# Patient Record
Sex: Female | Born: 1965 | Race: White | Hispanic: No | Marital: Married | Smoking: Never smoker
Health system: Southern US, Community
[De-identification: ages and names within clinical notes are randomized; demographics above are authoritative.]

## PROBLEM LIST (undated history)

## (undated) DIAGNOSIS — G4700 Insomnia, unspecified: Secondary | ICD-10-CM

## (undated) DIAGNOSIS — M545 Low back pain, unspecified: Secondary | ICD-10-CM

## (undated) DIAGNOSIS — N920 Excessive and frequent menstruation with regular cycle: Secondary | ICD-10-CM

## (undated) DIAGNOSIS — N200 Calculus of kidney: Secondary | ICD-10-CM

## (undated) DIAGNOSIS — I3139 Other pericardial effusion (noninflammatory): Secondary | ICD-10-CM

## (undated) DIAGNOSIS — G43909 Migraine, unspecified, not intractable, without status migrainosus: Secondary | ICD-10-CM

## (undated) HISTORY — DX: Other pericardial effusion (noninflammatory): I31.39

## (undated) HISTORY — DX: Insomnia, unspecified: G47.00

## (undated) HISTORY — PX: DILATION AND CURETTAGE OF UTERUS: SHX78

## (undated) HISTORY — DX: Low back pain: M54.5

## (undated) HISTORY — DX: Calculus of kidney: N20.0

## (undated) HISTORY — DX: Low back pain, unspecified: M54.50

---

## 2008-05-31 ENCOUNTER — Encounter: Admission: RE | Admit: 2008-05-31 | Discharge: 2008-05-31 | Payer: Self-pay | Admitting: Obstetrics and Gynecology

## 2009-06-04 ENCOUNTER — Encounter: Admission: RE | Admit: 2009-06-04 | Discharge: 2009-06-04 | Payer: Self-pay | Admitting: Obstetrics and Gynecology

## 2009-10-08 ENCOUNTER — Encounter: Admission: RE | Admit: 2009-10-08 | Discharge: 2010-01-06 | Payer: Self-pay | Admitting: Gastroenterology

## 2010-06-09 ENCOUNTER — Encounter: Admission: RE | Admit: 2010-06-09 | Discharge: 2010-06-09 | Payer: Self-pay | Admitting: Obstetrics and Gynecology

## 2011-06-26 ENCOUNTER — Other Ambulatory Visit: Payer: Self-pay | Admitting: Obstetrics and Gynecology

## 2011-06-26 DIAGNOSIS — Z1231 Encounter for screening mammogram for malignant neoplasm of breast: Secondary | ICD-10-CM

## 2011-06-29 ENCOUNTER — Ambulatory Visit
Admission: RE | Admit: 2011-06-29 | Discharge: 2011-06-29 | Disposition: A | Discharge status: Home / Self Care | Payer: Managed Care, Other (non HMO) | Source: Ambulatory Visit | Attending: Obstetrics and Gynecology | Admitting: Obstetrics and Gynecology

## 2011-06-29 DIAGNOSIS — Z1231 Encounter for screening mammogram for malignant neoplasm of breast: Secondary | ICD-10-CM

## 2012-02-25 NOTE — OR Nursing (Signed)
Corrected procedure with Merrily Brittle.

## 2012-03-14 ENCOUNTER — Encounter (HOSPITAL_COMMUNITY): Payer: Self-pay | Admitting: Pharmacist

## 2012-03-14 ENCOUNTER — Encounter (HOSPITAL_COMMUNITY): Payer: Self-pay | Admitting: *Deleted

## 2012-03-23 ENCOUNTER — Ambulatory Visit: Admit: 2012-03-23 | Payer: Self-pay | Admitting: Obstetrics and Gynecology

## 2012-03-23 SURGERY — DILATATION & CURETTAGE/HYSTEROSCOPY WITH NOVASURE ABLATION
Anesthesia: Choice

## 2012-03-28 ENCOUNTER — Encounter (HOSPITAL_COMMUNITY): Payer: Self-pay | Admitting: Obstetrics and Gynecology

## 2012-03-28 DIAGNOSIS — N920 Excessive and frequent menstruation with regular cycle: Secondary | ICD-10-CM

## 2012-03-28 HISTORY — DX: Excessive and frequent menstruation with regular cycle: N92.0

## 2012-03-28 NOTE — H&P (Signed)
Jill Jenkins is an 46 y.o. female. [id number] with heavy, long, crampy menses.  Lasting 12-14 days.  Neg EMB approximately 1 year ago.  Previously with nl 5 day cycles.  Desires management.  Polyps noted on SIUS.  Will have operative hysteroscopy, D&C, polypectomy, novasure endometrial ablation.  D/w w pt r/b/a    Pertinent Gynecological History: No h/o STDs x condylomata, no abn pap OB History: G3, P2012 TSVD x 2, 15 wk AB;    Menorrhagia developed over last 1-2 years, neg EMB apx 1 yr ago.     Menstrual History: Patient's last menstrual period was 03/12/2012.    Past Medical History  Diagnosis Date  . Migraines   . Menorrhagia 03/28/2012    Past Surgical History  Procedure Date  . Dilation and curettage of uterus   T&A, Breast Augmentatiom   Social History:   reports that she has never smoked. She does not have any smokeless tobacco history on file. She reports that she does not drink alcohol or use illicit drugs. 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   HENT: Negative.   Eyes: Negative.   Respiratory: Negative.   Cardiovascular: Negative.   Gastrointestinal: Negative.   Genitourinary: Negative.   Musculoskeletal: Negative.   Skin: Negative.   Neurological: Negative.   Psychiatric/Behavioral: Negative.     Last menstrual period 03/12/2012. Physical Exam  Constitutional: She is oriented to person, place, and time. She appears well-developed and well-nourished.  HENT:  Head: Normocephalic and atraumatic.  Eyes: Conjunctivae are normal. Pupils are equal, round, and reactive to light.  Neck: Normal range of motion. Neck supple. No thyromegaly present.  Cardiovascular: Normal rate and regular rhythm.   Respiratory: Effort normal and breath sounds normal. No respiratory distress.  GI: Soft. Bowel  sounds are normal. She exhibits no distension. There is no tenderness.  Musculoskeletal: Normal range of motion.  Neurological: She is alert and oriented to person, place, and time.  Skin: Skin is warm and dry.  Psychiatric: She has a normal mood and affect. Her behavior is normal.    Assessment/Plan: Will proceed w/ operative hysteroscopy, D&C, polypectomy, and Novasure endometrial ablation  BOVARD,Demyan Fugate 03/28/2012, 10:06 PM

## 2012-03-29 ENCOUNTER — Ambulatory Visit (HOSPITAL_COMMUNITY)
Admission: RE | Admit: 2012-03-29 | Discharge: 2012-03-29 | Disposition: A | Discharge status: Home / Self Care | Payer: BC Managed Care – PPO | Source: Ambulatory Visit | Attending: Obstetrics and Gynecology | Admitting: Obstetrics and Gynecology

## 2012-03-29 ENCOUNTER — Encounter (HOSPITAL_COMMUNITY): Payer: Self-pay | Admitting: Anesthesiology

## 2012-03-29 ENCOUNTER — Ambulatory Visit (HOSPITAL_COMMUNITY): Payer: BC Managed Care – PPO | Admitting: Anesthesiology

## 2012-03-29 ENCOUNTER — Encounter (HOSPITAL_COMMUNITY)
Admission: RE | Disposition: A | Discharge status: Home / Self Care | Payer: Self-pay | Source: Ambulatory Visit | Attending: Obstetrics and Gynecology

## 2012-03-29 DIAGNOSIS — N840 Polyp of corpus uteri: Secondary | ICD-10-CM | POA: Insufficient documentation

## 2012-03-29 DIAGNOSIS — N920 Excessive and frequent menstruation with regular cycle: Secondary | ICD-10-CM | POA: Insufficient documentation

## 2012-03-29 DIAGNOSIS — N938 Other specified abnormal uterine and vaginal bleeding: Secondary | ICD-10-CM | POA: Insufficient documentation

## 2012-03-29 DIAGNOSIS — N949 Unspecified condition associated with female genital organs and menstrual cycle: Secondary | ICD-10-CM | POA: Insufficient documentation

## 2012-03-29 HISTORY — DX: Migraine, unspecified, not intractable, without status migrainosus: G43.909

## 2012-03-29 HISTORY — DX: Excessive and frequent menstruation with regular cycle: N92.0

## 2012-03-29 HISTORY — PX: POLYPECTOMY: SHX5525

## 2012-03-29 LAB — CBC
HCT: 40.9 % (ref 36.0–46.0)
MCH: 30.8 pg (ref 26.0–34.0)
MCV: 94.7 fL (ref 78.0–100.0)
Platelets: 198 10*3/uL (ref 150–400)
RDW: 12.4 % (ref 11.5–15.5)
WBC: 7.8 10*3/uL (ref 4.0–10.5)

## 2012-03-29 SURGERY — DILATATION & CURETTAGE/HYSTEROSCOPY WITH NOVASURE ABLATION
Anesthesia: General | Site: Uterus | Wound class: Clean Contaminated

## 2012-03-29 MED ORDER — IBUPROFEN 800 MG PO TABS: 800.0000 mg | ORAL_TABLET | Freq: Four times a day (QID) | ORAL | Status: AC | PRN

## 2012-03-29 MED ORDER — GLYCOPYRROLATE 0.2 MG/ML IJ SOLN
INTRAMUSCULAR | Status: AC
  Filled 2012-03-29: qty 1

## 2012-03-29 MED ORDER — DEXAMETHASONE SODIUM PHOSPHATE 4 MG/ML IJ SOLN
INTRAMUSCULAR | Status: DC | PRN
  Administered 2012-03-29: 10 mg via INTRAVENOUS

## 2012-03-29 MED ORDER — MIDAZOLAM HCL 2 MG/2ML IJ SOLN
INTRAMUSCULAR | Status: AC
  Filled 2012-03-29: qty 2

## 2012-03-29 MED ORDER — KETOROLAC TROMETHAMINE 30 MG/ML IJ SOLN
INTRAMUSCULAR | Status: DC | PRN
  Administered 2012-03-29: 30 mg via INTRAVENOUS

## 2012-03-29 MED ORDER — LIDOCAINE HCL (CARDIAC) 20 MG/ML IV SOLN
INTRAVENOUS | Status: AC
  Filled 2012-03-29: qty 5

## 2012-03-29 MED ORDER — ONDANSETRON 4 MG PO TBDP
4.0000 mg | ORAL_TABLET | Freq: Once | ORAL | Status: AC
  Administered 2012-03-29: 4 mg via ORAL

## 2012-03-29 MED ORDER — HYDROMORPHONE HCL PF 1 MG/ML IJ SOLN
INTRAMUSCULAR | Status: AC
  Administered 2012-03-29: 0.5 mg via INTRAVENOUS
  Filled 2012-03-29: qty 1

## 2012-03-29 MED ORDER — KETOROLAC TROMETHAMINE 60 MG/2ML IM SOLN
INTRAMUSCULAR | Status: AC
  Filled 2012-03-29: qty 2

## 2012-03-29 MED ORDER — LACTATED RINGERS IV SOLN
INTRAVENOUS | Status: DC
  Administered 2012-03-29 (×2): via INTRAVENOUS

## 2012-03-29 MED ORDER — ONDANSETRON 4 MG PO TBDP
ORAL_TABLET | ORAL | Status: AC
  Administered 2012-03-29: 4 mg via ORAL
  Filled 2012-03-29: qty 1

## 2012-03-29 MED ORDER — KETOROLAC TROMETHAMINE 60 MG/2ML IM SOLN
INTRAMUSCULAR | Status: DC | PRN
  Administered 2012-03-29: 30 mg via INTRAMUSCULAR

## 2012-03-29 MED ORDER — FENTANYL CITRATE 0.05 MG/ML IJ SOLN: 25.0000 ug | INTRAMUSCULAR | Status: DC | PRN

## 2012-03-29 MED ORDER — OXYCODONE-ACETAMINOPHEN 5-325 MG PO TABS: 1.0000 | ORAL_TABLET | Freq: Four times a day (QID) | ORAL | Status: AC | PRN

## 2012-03-29 MED ORDER — LIDOCAINE HCL (PF) 1 % IJ SOLN
INTRAMUSCULAR | Status: DC | PRN
  Administered 2012-03-29: 14 mL

## 2012-03-29 MED ORDER — FENTANYL CITRATE 0.05 MG/ML IJ SOLN
INTRAMUSCULAR | Status: DC | PRN
  Administered 2012-03-29 (×3): 50 ug via INTRAVENOUS

## 2012-03-29 MED ORDER — OXYCODONE-ACETAMINOPHEN 5-325 MG PO TABS
ORAL_TABLET | ORAL | Status: AC
  Administered 2012-03-29: 1 via ORAL
  Filled 2012-03-29: qty 1

## 2012-03-29 MED ORDER — FENTANYL CITRATE 0.05 MG/ML IJ SOLN
INTRAMUSCULAR | Status: AC
  Filled 2012-03-29: qty 5

## 2012-03-29 MED ORDER — ONDANSETRON HCL 4 MG/2ML IJ SOLN
INTRAMUSCULAR | Status: AC
  Filled 2012-03-29: qty 2

## 2012-03-29 MED ORDER — MIDAZOLAM HCL 5 MG/5ML IJ SOLN
INTRAMUSCULAR | Status: DC | PRN
  Administered 2012-03-29: 1 mg via INTRAVENOUS

## 2012-03-29 MED ORDER — HYDROMORPHONE HCL PF 1 MG/ML IJ SOLN
2.0000 mg | INTRAMUSCULAR | Status: DC | PRN
  Administered 2012-03-29 (×2): 0.5 mg via INTRAVENOUS

## 2012-03-29 MED ORDER — OXYCODONE-ACETAMINOPHEN 5-325 MG PO TABS
1.0000 | ORAL_TABLET | ORAL | Status: DC | PRN
  Administered 2012-03-29: 1 via ORAL

## 2012-03-29 MED ORDER — DEXAMETHASONE SODIUM PHOSPHATE 10 MG/ML IJ SOLN
INTRAMUSCULAR | Status: AC
  Filled 2012-03-29: qty 1

## 2012-03-29 MED ORDER — PROPOFOL 10 MG/ML IV EMUL
INTRAVENOUS | Status: DC | PRN
  Administered 2012-03-29: 200 mg via INTRAVENOUS

## 2012-03-29 MED ORDER — ONDANSETRON HCL 4 MG/2ML IJ SOLN
INTRAMUSCULAR | Status: DC | PRN
  Administered 2012-03-29: 4 mg via INTRAVENOUS

## 2012-03-29 MED ORDER — LIDOCAINE HCL (CARDIAC) 20 MG/ML IV SOLN
INTRAVENOUS | Status: DC | PRN
  Administered 2012-03-29: 80 mg via INTRAVENOUS

## 2012-03-29 MED ORDER — LACTATED RINGERS IR SOLN
Status: DC | PRN
  Administered 2012-03-29: 3000 mL

## 2012-03-29 MED ORDER — METOCLOPRAMIDE HCL 5 MG/ML IJ SOLN
INTRAMUSCULAR | Status: DC | PRN
  Administered 2012-03-29 (×2): 5 mg via INTRAVENOUS

## 2012-03-29 SURGICAL SUPPLY — 12 items
ABLATOR ENDOMETRIAL BIPOLAR (ABLATOR) ×3 IMPLANT
CATH ROBINSON RED A/P 16FR (CATHETERS) ×3 IMPLANT
CLOTH BEACON ORANGE TIMEOUT ST (SAFETY) ×3 IMPLANT
DRESSING TELFA 8X3 (GAUZE/BANDAGES/DRESSINGS) ×3 IMPLANT
GLOVE BIO SURGEON STRL SZ 6.5 (GLOVE) ×3 IMPLANT
GLOVE BIOGEL PI IND STRL 7.0 (GLOVE) ×6 IMPLANT
GLOVE BIOGEL PI INDICATOR 7.0 (GLOVE) ×3
GOWN PREVENTION PLUS LG XLONG (DISPOSABLE) ×6 IMPLANT
GOWN STRL REIN XL XLG (GOWN DISPOSABLE) ×9 IMPLANT
PACK HYSTEROSCOPY LF (CUSTOM PROCEDURE TRAY) ×3 IMPLANT
TOWEL OR 17X24 6PK STRL BLUE (TOWEL DISPOSABLE) ×6 IMPLANT
WATER STERILE IRR 1000ML POUR (IV SOLUTION) ×3 IMPLANT

## 2012-03-29 NOTE — Interval H&P Note (Signed)
History and Physical Interval Note:  03/29/2012 8:39 AM  Jill Jenkins  has presented today for surgery, with the diagnosis of endometrial polyp, dysfunctional bleeding  The various methods of treatment have been discussed with the patient and family. After consideration of risks, benefits and other options for treatment, the patient has consented to  Procedure(s) (LRB): DILATATION & CURETTAGE/HYSTEROSCOPY WITH NOVASURE ABLATION (N/A) CERVICAL POLYPECTOMY (N/A) as a surgical intervention .  The patient's history has been reviewed, patient examined, no change in status, stable for surgery.  I have reviewed the patients' chart and labs.  Questions were answered to the patient's satisfaction.     BOVARD,Zoejane Gaulin

## 2012-03-29 NOTE — Transfer of Care (Signed)
Immediate Anesthesia Transfer of Care Note  Patient: Jill Jenkins  Procedure(s) Performed: Procedure(s) (LRB): DILATATION & CURETTAGE/HYSTEROSCOPY WITH NOVASURE ABLATION (N/A) POLYPECTOMY (N/A)  Patient Location: PACU  Anesthesia Type: General  Level of Consciousness: awake, alert  and oriented  Airway & Oxygen Therapy: Patient Spontanous Breathing and Patient connected to nasal cannula oxygen  Post-op Assessment: Report given to PACU RN and Post -op Vital signs reviewed and stable  Post vital signs: Reviewed and stable  Complications: No apparent anesthesia complications

## 2012-03-29 NOTE — Anesthesia Procedure Notes (Signed)
Procedure Name: LMA Insertion Date/Time: 03/29/2012 9:41 AM Performed by: Graciela Husbands Pre-anesthesia Checklist: Suction available, Emergency Drugs available, Timeout performed, Patient identified and Patient being monitored Patient Re-evaluated:Patient Re-evaluated prior to inductionOxygen Delivery Method: Circle system utilized Preoxygenation: Pre-oxygenation with 100% oxygen Intubation Type: IV induction LMA: LMA inserted LMA Size: 4.0 Number of attempts: 1 Placement Confirmation: positive ETCO2 and breath sounds checked- equal and bilateral Tube secured with: Tape Dental Injury: Teeth and Oropharynx as per pre-operative assessment

## 2012-03-29 NOTE — Brief Op Note (Signed)
03/29/2012  10:25 AM  PATIENT:  Jill Jenkins  46 y.o. female  PRE-OPERATIVE DIAGNOSIS:  endometrial polyp, dysfunctional bleeding  POST-OPERATIVE DIAGNOSIS:  endometrial polyp, dysfunctional bleeding  PROCEDURE:  Procedure(s) (LRB): DILATATION & CURETTAGE/HYSTEROSCOPY WITH NOVASURE ABLATION (N/A) POLYPECTOMY (N/A)  SURGEON:  Surgeon(s) and Role:    * Sherron Monday, MD - Primary  ANESTHESIA:   general and paracervical block  EBL:  Total I/O In: 1000 [I.V.:1000] Out: - 50cc i&o cath  FINDINGS: uterus sounds to 9 cm, cervical length 3cm, cavity length 6 cm, cavity width 2.9cm; power = 96 watts, time 61 sec  BLOOD ADMINISTERED:none  DRAINS: none   LOCAL MEDICATIONS USED:  LIDOCAINE   SPECIMEN:  Source of Specimen:  endometrial currettings  DISPOSITION OF SPECIMEN:  PATHOLOGY  COUNTS:  YES  TOURNIQUET:  * No tourniquets in log *  DICTATION: .Other Dictation: Dictation Number [id number]  PLAN OF CARE: Discharge to home after PACU  PATIENT DISPOSITION:  PACU - hemodynamically stable.   Delay start of Pharmacological VTE agent (>24hrs) due to surgical blood loss or risk of bleeding: not applicable

## 2012-03-29 NOTE — Anesthesia Preprocedure Evaluation (Signed)

## 2012-03-30 ENCOUNTER — Encounter (HOSPITAL_COMMUNITY): Payer: Self-pay | Admitting: Obstetrics and Gynecology

## 2012-03-30 NOTE — Op Note (Signed)
NAME:  Jill Jenkins, Jill Jenkins                ACCOUNT NO.:  [id number]  MEDICAL RECORD NO.:  [id number]  LOCATION:  [facility]                          FACILITY:  [facility]  PHYSICIAN:  Sherron Monday, MD        DATE OF BIRTH:  06-18-1966  DATE OF PROCEDURE:  03/29/2012 DATE OF DISCHARGE:  03/29/2012                              OPERATIVE REPORT   PREOPERATIVE DIAGNOSIS:  Endometrial polyps, menorrhagia, dysfunctional uterine bleeding.  POSTOPERATIVE DIAGNOSIS: same  PROCEDURE:  Operative hysteroscopy, polypectomy, dilatation and curettage, and NovaSure endometrial ablation.  SURGEON:  Sherron Monday, MD  ANESTHESIA:  General by LMA and paracervical block.  ESTIMATED BLOOD LOSS:  Minimal.  IV FLUIDS:  1000 mL.  URINE OUTPUT:  50 mL by I and O cath prior to the procedure.  FINDINGS:  Uterus sounds to 9 cm.  Cervical length of 3 cm, cavity length of 6 cm, cavity width by NovaSure endometrial device found to be 2.9 cm.  Power 96 watts for 61 seconds.  Paracervical block with 1% lidocaine.  SPECIMEN:  Endometrial curettings to Pathology.  COMPLICATIONS:  None.  DISPOSITION:  Stable to PACU.  DESCRIPTION OF PROCEDURE:  After informed consent was reviewed with the patient including risks, benefits, and alternatives of surgical procedure, she was transported to the OR in stable condition, placed on the table in supine position.  General anesthesia was induced, found to be adequate.  She was then placed in the [place] stirrups, prepped and draped in the normal sterile fashion.  Her bladder was sterilely drained.  Using an open-sided speculum, her cervix was easily visualized, grasped with a single-tooth tenaculum, and dilated to accommodate the hysteroscope to approximately 21-French.  A brief intrauterine survey was performed; finding, bilateral ostia and noting much fluffy lining as well as likely polyps on the posterior wall of her uterus.  D and C was performed, yielding tissue.  Again,  the hysteroscope was inserted.  Survey revealed some remaining fluffy tissue on the posterior aspect of her uterus, so it was again removed by D and C.  The cervix was then measured to be 3-cm long with the Hegar dilator. Using the NovaSure device, the cavity width was calculated.  The cavity assessment was passed and the NovaSure device was then activated at 96 watts for 61 seconds.  The uterus was noted to be hemostatic.  The tenaculum was removed and its site was noted to be hemostatic.  The speculum was removed from the vagina.  The patient was returned to the supine position.  Sponge, lap, and needle counts were correct x2.  The patient tolerated the procedure well.     Sherron Monday, MD     JB/MEDQ  D:  03/29/2012  T:  03/30/2012  Job:  [id number]

## 2012-03-31 NOTE — Anesthesia Postprocedure Evaluation (Signed)
  Anesthesia Post-op Note  Patient: Jill Jenkins  Procedure(s) Performed: Procedure(s) (LRB): DILATATION & CURETTAGE/HYSTEROSCOPY WITH NOVASURE ABLATION (N/A) POLYPECTOMY (N/A)  Patient is awake and responsive. Pain and nausea are reasonably well controlled. Vital signs are stable and clinically acceptable. Oxygen saturation is clinically acceptable. There are no apparent anesthetic complications at this time. Patient is ready for discharge.

## 2012-06-28 ENCOUNTER — Other Ambulatory Visit: Payer: Self-pay | Admitting: Obstetrics and Gynecology

## 2012-06-28 DIAGNOSIS — Z1231 Encounter for screening mammogram for malignant neoplasm of breast: Secondary | ICD-10-CM

## 2012-07-28 ENCOUNTER — Other Ambulatory Visit: Payer: Self-pay | Admitting: Obstetrics and Gynecology

## 2012-07-28 ENCOUNTER — Ambulatory Visit
Admission: RE | Admit: 2012-07-28 | Discharge: 2012-07-28 | Disposition: A | Discharge status: Home / Self Care | Payer: BC Managed Care – PPO | Source: Ambulatory Visit | Attending: Obstetrics and Gynecology | Admitting: Obstetrics and Gynecology

## 2012-07-28 DIAGNOSIS — Z1231 Encounter for screening mammogram for malignant neoplasm of breast: Secondary | ICD-10-CM

## 2012-07-29 ENCOUNTER — Other Ambulatory Visit: Payer: Self-pay | Admitting: Obstetrics and Gynecology

## 2012-07-29 DIAGNOSIS — N644 Mastodynia: Secondary | ICD-10-CM

## 2012-07-29 DIAGNOSIS — N63 Unspecified lump in unspecified breast: Secondary | ICD-10-CM

## 2012-08-02 ENCOUNTER — Other Ambulatory Visit: Payer: Self-pay | Admitting: Family Medicine

## 2012-08-09 ENCOUNTER — Ambulatory Visit
Admission: RE | Admit: 2012-08-09 | Discharge: 2012-08-09 | Disposition: A | Discharge status: Home / Self Care | Payer: BC Managed Care – PPO | Source: Ambulatory Visit | Attending: Obstetrics and Gynecology | Admitting: Obstetrics and Gynecology

## 2012-08-09 DIAGNOSIS — N644 Mastodynia: Secondary | ICD-10-CM

## 2012-08-09 DIAGNOSIS — N63 Unspecified lump in unspecified breast: Secondary | ICD-10-CM

## 2013-08-01 ENCOUNTER — Other Ambulatory Visit: Payer: Self-pay

## 2013-08-01 DIAGNOSIS — Z1231 Encounter for screening mammogram for malignant neoplasm of breast: Secondary | ICD-10-CM

## 2014-04-28 ENCOUNTER — Encounter: Payer: Self-pay | Admitting: *Deleted

## 2014-11-19 ENCOUNTER — Other Ambulatory Visit: Payer: Self-pay | Admitting: Obstetrics and Gynecology

## 2014-11-19 DIAGNOSIS — R928 Other abnormal and inconclusive findings on diagnostic imaging of breast: Secondary | ICD-10-CM

## 2014-11-22 ENCOUNTER — Other Ambulatory Visit: Payer: Self-pay | Admitting: Obstetrics and Gynecology

## 2014-11-22 ENCOUNTER — Ambulatory Visit
Admission: RE | Admit: 2014-11-22 | Discharge: 2014-11-22 | Disposition: A | Discharge status: Home / Self Care | Payer: BLUE CROSS/BLUE SHIELD | Source: Ambulatory Visit | Attending: Obstetrics and Gynecology | Admitting: Obstetrics and Gynecology

## 2014-11-22 DIAGNOSIS — R928 Other abnormal and inconclusive findings on diagnostic imaging of breast: Secondary | ICD-10-CM

## 2015-04-30 ENCOUNTER — Other Ambulatory Visit: Payer: Self-pay | Admitting: Obstetrics and Gynecology

## 2015-04-30 DIAGNOSIS — R921 Mammographic calcification found on diagnostic imaging of breast: Secondary | ICD-10-CM

## 2015-05-27 ENCOUNTER — Ambulatory Visit
Admission: RE | Admit: 2015-05-27 | Discharge: 2015-05-27 | Disposition: A | Discharge status: Home / Self Care | Payer: BLUE CROSS/BLUE SHIELD | Source: Ambulatory Visit | Attending: Obstetrics and Gynecology | Admitting: Obstetrics and Gynecology

## 2015-05-27 DIAGNOSIS — R921 Mammographic calcification found on diagnostic imaging of breast: Secondary | ICD-10-CM

## 2015-09-27 ENCOUNTER — Other Ambulatory Visit: Payer: Self-pay | Admitting: Obstetrics and Gynecology

## 2015-09-27 DIAGNOSIS — R921 Mammographic calcification found on diagnostic imaging of breast: Secondary | ICD-10-CM

## 2015-11-25 ENCOUNTER — Ambulatory Visit
Admission: RE | Admit: 2015-11-25 | Discharge: 2015-11-25 | Disposition: A | Discharge status: Home / Self Care | Payer: BLUE CROSS/BLUE SHIELD | Source: Ambulatory Visit | Attending: Obstetrics and Gynecology | Admitting: Obstetrics and Gynecology

## 2015-11-25 DIAGNOSIS — R921 Mammographic calcification found on diagnostic imaging of breast: Secondary | ICD-10-CM

## 2017-05-26 ENCOUNTER — Ambulatory Visit (INDEPENDENT_AMBULATORY_CARE_PROVIDER_SITE_OTHER): Payer: BLUE CROSS/BLUE SHIELD | Admitting: Neurology

## 2017-05-26 ENCOUNTER — Encounter (INDEPENDENT_AMBULATORY_CARE_PROVIDER_SITE_OTHER): Payer: Self-pay

## 2017-05-26 ENCOUNTER — Encounter: Payer: Self-pay | Admitting: Neurology

## 2017-05-26 VITALS — BP 119/91 | HR 70 | Ht 69.0 in | Wt 199.6 lb

## 2017-05-26 DIAGNOSIS — R55 Syncope and collapse: Secondary | ICD-10-CM | POA: Diagnosis not present

## 2017-05-26 DIAGNOSIS — R569 Unspecified convulsions: Secondary | ICD-10-CM

## 2017-05-26 DIAGNOSIS — G43009 Migraine without aura, not intractable, without status migrainosus: Secondary | ICD-10-CM

## 2017-05-26 NOTE — Progress Notes (Signed)
[facility] [address] [place]. [place] [address] [phone]       OFFICE CONSULT NOTE  Jill. Jill Jenkins Date of Birth:  1966-04-13 Medical Record Number:  [id number]   Referring MD:   Nicholes Rough, PA-c Reason for Referral:  seizure  HPI: Jill Jenkins is a 51 year pleasant Caucasian lady whose had 4 episodes in the last 3 months of brief tightening of her body with clenching of hands and feet with passing out briefly.. During the first episode she was lying on the bed and had  the phone in her hand and when she noticed tightening of her extremities and she must have passed out because when she woke up she had a dog on top of her body looking at her. She had a bad headache that started behind the ears and then spread to her neck lasted several hours. She was fully aware of her surroundings. Her mind was clear she did not have any disorientation or confusion. There was no tongue bite, incontinence or injury. She has had 4 other episodes occurring roughly around every 2-3 weeks. The episodes have been not as intense and she has had no eye witness. The last episode was last week and when she was asleep she felt dizzy and had slight sensation of room spinning and felt pressure and pain behind the ears. She denies any slurred speech, double vision, gait ataxia focal extremity weakness or numbness. She has no history she has no family history of epilepsy. The patient states she is under some stress due to her marriage but denies any significant increase recently. She does take Relpax 40 mg for symptomatic relief of her migraines and it usually works quite well. She has never tried this for her headaches following these episodes.  ROS:   14 system review of systems is positive for  fatigue, ear pain and fullness, headache, dizziness, seizure, insomnia and all other  systems negative  PMH:  Past Medical History:  Diagnosis Date  . Insomnia   . Low back pain   . Menorrhagia  03/28/2012  . Migraines   . Renal calculus     Social History:  Social History   Social History  . Marital status: Married    Spouse name: N/A  . Number of children: 51  . Years of education: N/A   Occupational History  . Not on file.   Social History Main Topics  . Smoking status: Never Smoker  . Smokeless tobacco: Never Used  . Alcohol use No  . Drug use: No  . Sexual activity: Yes    Birth control/ protection: Surgical   Other Topics Concern  . Not on file   Social History Narrative  . No narrative on file    Medications:   No current outpatient prescriptions on file prior to visit.   No current facility-administered medications on file prior to visit.     Allergies:   Allergies  Allergen Reactions  . No Known Allergies     Physical Exam General: well developed, well nourished, seated, in no evident distress Head: head normocephalic and atraumatic.   Neck: supple with no carotid or supraclavicular bruits Cardiovascular: regular rate and rhythm, no murmurs Musculoskeletal: no deformity Skin:  no rash/petichiae Vascular:  Normal pulses all extremities  Neurologic Exam Mental Status: Awake and fully alert. Oriented to place and time. Recent and remote memory intact. Attention span, concentration and fund of knowledge appropriate. Mood and affect appropriate.  Cranial Nerves:  Fundoscopic exam reveals sharp disc margins. Pupils equal, briskly reactive to light. Extraocular movements full without nystagmus. Visual fields full to confrontation. Hearing intact. Facial sensation intact. Face, tongue, palate moves normally and symmetrically.  Motor: Normal bulk and tone. Normal strength in all tested extremity muscles. Sensory.: intact to touch , pinprick , position and vibratory sensation.  Coordination: Rapid alternating movements normal in all extremities. Finger-to-nose and heel-to-shin performed accurately bilaterally. Gait and Station: Arises from chair  without difficulty. Stance is normal. Gait demonstrates normal stride length and balance . Able to heel, toe and tandem walk without difficulty.  Reflexes: 1+ and symmetric. Toes downgoing.       ASSESSMENT: 51 year old Caucasian lady with recurrent stereotypical episodes of tightening of the extremities followed by brief loss of consciousness and headaches of unclear significance. Possibilities include atypical migraine versus complex partial seizures vertebrobasilar TIAs. Long-standing history of migraine headaches    PLAN: I had a long discussion with the patient with regarding her episodes of passing out and tightening making of unclear significance. Possibilities include atypical migraine versus complex partial seizures of vertebrobasilar TIA I recommend further evaluation with MRI scan of the brain with and without contrast and MRA as an EEG. Agree with increasing Topamax dose to 50 mg in the morning and 100 at night. Check I advised the patient to continue to use Relpax for symptomatic relief of her migraines and increase participation in activities for stress relaxation-like regular exercise, swimming, medication and yoga. Greater than 50% time during this 45 minute consultation visit was spent on counseling and coordination of care about her episode of passing out, typical migraine, seizure discussion and answering questions She will return for follow-up in 2 months or call earlier if necessary. Antony Contras, MD  [facility] [address] [place], [place] [address]  Phone [phone] Fax [phone] Note: This document was prepared with digital dictation and possible smart phrase technology. Any transcriptional errors that result from this process are unintentional.

## 2017-05-26 NOTE — Patient Instructions (Addendum)
I had a long discussion with the patient with regarding her episodes of passing out and tightening making of unclear significance. Possibilities include atypical migraine versus complex partial seizures of vertebrobasilar TIA I recommend further evaluation with MRI scan of the brain with and without contrast and MRA as an EEG. Agree with increasing Topamax dose to 50 mg in the morning and 100 at night. Check I advised the patient to continue to use Relpax for symptomatic relief of her migraines and increase participation in activities for stress relaxation-like regular exercise, swimming, medication and yoga. She will return for follow-up in 2 months or call earlier if necessary.

## 2017-05-27 ENCOUNTER — Telehealth: Payer: Self-pay | Admitting: Radiology

## 2017-05-27 NOTE — Telephone Encounter (Signed)
Pt returned Emily's call °

## 2017-05-27 NOTE — Telephone Encounter (Signed)
I spoke to patient and she is scheduled to have all her images done on 06/09/17 at the [facility] mobile unit.

## 2017-06-01 ENCOUNTER — Ambulatory Visit: Payer: BLUE CROSS/BLUE SHIELD | Admitting: Diagnostic Neuroimaging

## 2017-06-02 ENCOUNTER — Ambulatory Visit (INDEPENDENT_AMBULATORY_CARE_PROVIDER_SITE_OTHER): Payer: BLUE CROSS/BLUE SHIELD

## 2017-06-02 DIAGNOSIS — R55 Syncope and collapse: Secondary | ICD-10-CM

## 2017-06-02 DIAGNOSIS — R569 Unspecified convulsions: Secondary | ICD-10-CM | POA: Diagnosis not present

## 2017-06-08 ENCOUNTER — Telehealth: Payer: Self-pay

## 2017-06-08 NOTE — Telephone Encounter (Signed)
Rn call patient that EEG was normal. Pt verbalized understanding. ------

## 2017-06-08 NOTE — Telephone Encounter (Signed)
-----   Message from Garvin Fila, MD sent at 06/07/2017  3:54 PM EDT ----- Mitchell Heir inform the patient had EEG study was normal

## 2017-06-09 ENCOUNTER — Ambulatory Visit (INDEPENDENT_AMBULATORY_CARE_PROVIDER_SITE_OTHER): Payer: BLUE CROSS/BLUE SHIELD

## 2017-06-09 DIAGNOSIS — R569 Unspecified convulsions: Secondary | ICD-10-CM | POA: Diagnosis not present

## 2017-06-09 DIAGNOSIS — G43009 Migraine without aura, not intractable, without status migrainosus: Secondary | ICD-10-CM | POA: Diagnosis not present

## 2017-06-09 DIAGNOSIS — R55 Syncope and collapse: Secondary | ICD-10-CM | POA: Diagnosis not present

## 2017-06-09 MED ORDER — GADOPENTETATE DIMEGLUMINE 469.01 MG/ML IV SOLN: 20.0000 mL | Freq: Once | INTRAVENOUS | Status: DC | PRN

## 2017-06-21 ENCOUNTER — Telehealth: Payer: Self-pay | Admitting: Neurology

## 2017-06-21 NOTE — Telephone Encounter (Signed)
I spoke to the patient and gave her results of the  EEG which was normal and MRI scan which shows a few tiny nonspecific spots likely related to her migraines. MRA of the brain and neck were also both unremarkable. She had another episode of possible complicated migraine and appears to be tolerating Topamax well and hence I recommend she increase the Topamax to 100 mg twice daily. She was advised to finish off her current bottle and call for a new prescription when needed

## 2017-06-21 NOTE — Telephone Encounter (Signed)
Dr.Sethi pt would like results of MRA head,MRA neck. I can call her please advise me thanks.

## 2017-06-21 NOTE — Telephone Encounter (Signed)
Patient requesting MRI results

## 2017-08-02 ENCOUNTER — Ambulatory Visit: Payer: BLUE CROSS/BLUE SHIELD | Admitting: Neurology

## 2017-08-19 ENCOUNTER — Ambulatory Visit (INDEPENDENT_AMBULATORY_CARE_PROVIDER_SITE_OTHER): Payer: BLUE CROSS/BLUE SHIELD | Admitting: Neurology

## 2017-08-19 ENCOUNTER — Other Ambulatory Visit: Payer: Self-pay

## 2017-08-19 ENCOUNTER — Encounter (INDEPENDENT_AMBULATORY_CARE_PROVIDER_SITE_OTHER): Payer: Self-pay

## 2017-08-19 ENCOUNTER — Encounter: Payer: Self-pay | Admitting: Neurology

## 2017-08-19 VITALS — BP 116/88 | HR 80 | Wt 203.6 lb

## 2017-08-19 DIAGNOSIS — G43009 Migraine without aura, not intractable, without status migrainosus: Secondary | ICD-10-CM | POA: Diagnosis not present

## 2017-08-19 MED ORDER — TOPIRAMATE 50 MG PO TABS: 100.0000 mg | ORAL_TABLET | Freq: Two times a day (BID) | ORAL | 0 refills | Status: DC

## 2017-08-19 NOTE — Progress Notes (Signed)
[facility] [address] [place]. [place] [address] [phone]       OFFICE FOLLOW UP VISIT Ms. [place] Date of Birth:  April 29, 1966 Medical Record Number:  [id number]   Referring MD:   Nicholes Rough, PA-c Reason for Referral:  seizure  [id number] Consult 05/26/2017 ;  Ms Husband is a 24 year pleasant Caucasian lady whose had 4 episodes in the last 3 months of brief tightening of her body with clenching of hands and feet with passing out briefly.. During the first episode she was lying on the bed and had  the phone in her hand and when she noticed tightening of her extremities and she must have passed out because when she woke up she had a dog on top of her body looking at her. She had a bad headache that started behind the ears and then spread to her neck lasted several hours. She was fully aware of her surroundings. Her mind was clear she did not have any disorientation or confusion. There was no tongue bite, incontinence or injury. She has had 4 other episodes occurring roughly around every 2-3 weeks. The episodes have been not as intense and she has had no eye witness. The last episode was last week and when she was asleep she felt dizzy and had slight sensation of room spinning and felt pressure and pain behind the ears. She denies any slurred speech, double vision, gait ataxia focal extremity weakness or numbness. She has no history she has no family history of epilepsy. The patient states she is under some stress due to her marriage but denies any significant increase recently. She does take Relpax 40 mg for symptomatic relief of her migraines and it usually works quite well. She has never tried this for her headaches following these episodes. Update 08/19/2017 : She returns for follow-up after initial consultation visit 3 months ago. She states increasing the Topamax 200 g twice daily has helped. She initially had some side effects mainly his difficulty in vision but that  seems to have settled down. She has noticed improvement and she had only a single episode of her typical migraine around Thanksgiving while visiting [place]. She had MRI scan of the brain done on 06/04/17 which I have personally reviewed showed nonspecific white matter hyperintensities but no other significant abnormalities. MRI of the brain and neck both to unremarkable. She has no new neurological complaints. She denies any symptoms suggestive of diplopia, vertigo, fatigue, gait or balance problems ROS:   14 system review of systems is positive for  fatigue, ear pain and fullness, headache, dizziness, seizure, insomnia and all other  systems negative  PMH:  Past Medical History:  Diagnosis Date  . Insomnia   . Low back pain   . Menorrhagia 03/28/2012  . Migraines   . Renal calculus     Social History:  Social History   Socioeconomic History  . Marital status: Married    Spouse name: Not on file  . Number of children: Not on file  . Years of education: Not on file  . Highest education level: Not on file  Social Needs  . Financial resource strain: Not on file  . Food insecurity - worry: Not on file  . Food insecurity - inability: Not on file  . Transportation needs - medical: Not on file  . Transportation needs - non-medical: Not on file  Occupational History  . Not on file  Tobacco Use  . Smoking status: Never Smoker  .  Smokeless tobacco: Never Used  Substance and Sexual Activity  . Alcohol use: No  . Drug use: No  . Sexual activity: Yes    Birth control/protection: Surgical  Other Topics Concern  . Not on file  Social History Narrative  . Not on file    Medications:   Current Outpatient Medications on File Prior to Visit  Medication Sig Dispense Refill  . eletriptan (RELPAX) 40 MG tablet When you HAVE a severe migraine, take one; may take a second dose 2 hours later, but no more than 2 doses in 24 hours or 4 doses/week.    . estradiol (ESTRACE VAGINAL) 0.1 MG/GM  vaginal cream Estrace 0.01% (0.1 mg/gram) vaginal cream    . Eszopiclone 3 MG TABS Take by mouth.     Current Facility-Administered Medications on File Prior to Visit  Medication Dose Route Frequency Provider Last Rate Last Dose  . gadopentetate dimeglumine (MAGNEVIST) injection 20 mL  20 mL Intravenous Once PRN Garvin Fila, MD        Allergies:   Allergies  Allergen Reactions  . No Known Allergies     Physical Exam General: well developed, well nourished, seated, in no evident distress Head: head normocephalic and atraumatic.   Neck: supple with no carotid or supraclavicular bruits Cardiovascular: regular rate and rhythm, no murmurs Musculoskeletal: no deformity Skin:  no rash/petichiae Vascular:  Normal pulses all extremities  Neurologic Exam Mental Status: Awake and fully alert. Oriented to place and time. Recent and remote memory intact. Attention span, concentration and fund of knowledge appropriate. Mood and affect appropriate.  Cranial Nerves: Fundoscopic exam reveals sharp disc margins. Pupils equal, briskly reactive to light. Extraocular movements full without nystagmus. Visual fields full to confrontation. Hearing intact. Facial sensation intact. Face, tongue, palate moves normally and symmetrically.  Motor: Normal bulk and tone. Normal strength in all tested extremity muscles. Sensory.: intact to touch , pinprick , position and vibratory sensation.  Coordination: Rapid alternating movements normal in all extremities. Finger-to-nose and heel-to-shin performed accurately bilaterally. Gait and Station: Arises from chair without difficulty. Stance is normal. Gait demonstrates normal stride length and balance . Able to heel, toe and tandem walk without difficulty.  Reflexes: 1+ and symmetric. Toes downgoing.       ASSESSMENT: 51 year old Caucasian lady with recurrent stereotypical episodes of tightening of the extremities followed by brief loss of consciousness and  headaches of unclear significance. Possibilities include atypical migraine versus complex partial seizures vertebrobasilar TIAs. Long-standing history of migraine headaches. Abnormal MRI showing nonspecific white matter hyperintensities likely migraine related    PLAN: I had a long discussion with the patient with regards to her episodes of atypical migraine which appeared to have responded to Topamax. She seems to be tolerating it well in the current dose of 100 mg twice daily with acceptable side effects. She will return for follow-up in 3 months to see my nurse practitioner and if still doing well will consider tapering Topamax at that visit. I advised the patient to continue to use Relpax for symptomatic relief of her migraines and increase participation in activities for stress relaxation-like regular exercise, swimming, medication and yoga. Greater than 50% time during this 25 minute  visit was spent on counseling and coordination of care about her episode of passing out, typical migraine, seizure discussion and answering questions   Antony Contras, MD  [facility] [address] [place], [place] [address]  Phone [phone] Fax [phone] Note: This document was prepared with digital  dictation and possible smart phrase technology. Any transcriptional errors that result from this process are unintentional.

## 2017-08-19 NOTE — Patient Instructions (Signed)
I had a long discussion with the patient with regards to her episodes of atypical migraine which appeared to have responded to Topamax. She seems to be tolerating it well in the current dose of 100 mg twice daily with acceptable side effects. She will return for follow-up in 3 months to see my nurse practitioner and if still doing well will consider tapering Topamax at that visit.

## 2017-09-15 ENCOUNTER — Telehealth: Payer: Self-pay | Admitting: Neurology

## 2017-09-15 NOTE — Telephone Encounter (Signed)
Pt calling re: her topiramate (TOPAMAX) 50 MG tablet, she states from [facility] the dosage is not correct and it has her PCP's name on it.  Pt is asking for clarity on if it was filled by Dr Leonie Man and if so to advise the dosage it no correct, it states : take 1 tablet twice a day which is only 100 mg, she is supposed to 200 mg.  Pt is aware that RN Katrina is out of the office today, she is asking for a call on her return

## 2017-09-16 ENCOUNTER — Other Ambulatory Visit: Payer: Self-pay

## 2017-09-16 MED ORDER — TOPIRAMATE 50 MG PO TABS: 100.0000 mg | ORAL_TABLET | Freq: Two times a day (BID) | ORAL | 1 refills | Status: DC

## 2017-09-16 MED ORDER — TOPIRAMATE 50 MG PO TABS: 100.0000 mg | ORAL_TABLET | Freq: Two times a day (BID) | ORAL | 3 refills | Status: DC

## 2017-09-16 NOTE — Telephone Encounter (Signed)
Rn call patient about her topamax rx directions, and dose not be correct at [facility]. Rn stated Dr. Leonie Man sent the topamax to [facility] on 08/19/2017 at last visit. Rn stated the rx of 100mg  in the am, and 100mg  in the pm will be sent to [facility]. PT verbalized understanding.

## 2017-09-16 NOTE — Telephone Encounter (Signed)
Rn call patient about her topamax dosage not being at [facility]. Rn stated Dr. Leonie Man sent the rx at last visit to [facility] of 100mg  twice daily. Pt stated she takes 200mg  daily. Rn stated the rx is 50mg  with 2 tablets in the am,and 2 tablets in the pm. PT stated that's the correct dosage. Rn sent to [facility] with 90 day rx.

## 2017-11-16 NOTE — Progress Notes (Signed)
[facility]  PATIENT: Jill Jenkins DOB: Jan 20, 1966   REASON FOR VISIT: Follow-up for migraine  HISTORY FROM: patient    HISTORY OF PRESENT ILLNESS: Initial Consult 05/26/2017 ;  Jill Jenkins is a 37 year pleasant Caucasian lady whose had 4 episodes in the last 3 months of brief tightening of her body with clenching of hands and feet with passing out briefly.. During the first episode she was lying on the bed and had  the phone in her hand and when she noticed tightening of her extremities and she must have passed out because when she woke up she had a dog on top of her body looking at her. She had a bad headache that started behind the ears and then spread to her neck lasted several hours. She was fully aware of her surroundings. Her mind was clear she did not have any disorientation or confusion. There was no tongue bite, incontinence or injury. She has had 4 other episodes occurring roughly around every 2-3 weeks. The episodes have been not as intense and she has had no eye witness. The last episode was last week and when she was asleep she felt dizzy and had slight sensation of room spinning and felt pressure and pain behind the ears. She denies any slurred speech, double vision, gait ataxia focal extremity weakness or numbness. She has no history she has no family history of epilepsy. The patient states she is under some stress due to her marriage but denies any significant increase recently. She does take Relpax 40 mg for symptomatic relief of her migraines and it usually works quite well. She has never tried this for her headaches following these episodes. Update 08/19/2017 : She returns for follow-up after initial consultation visit 3 months ago. She states increasing the Topamax 200 g twice daily has helped. She initially had some side effects mainly his difficulty in vision but that seems to have settled down. She has noticed improvement and she had only a single episode of  her typical migraine around Thanksgiving while visiting [place]. She had MRI scan of the brain done on 06/04/17 which I have personally reviewed showed nonspecific white matter hyperintensities but no other significant abnormalities. MRI of the brain and neck both to unremarkable. She has no new neurological complaints. She denies any symptoms suggestive of diplopia, vertigo, fatigue, gait or balance problems Update 11/17/17: Patient is being seen today for 69-month follow-up.  She states that her last migraine was prior to Thanksgiving.  She has been tolerating Topamax 100 mg twice daily well but does complain of hair loss with this medication.  She states that she has an appointment with a specialist at [facility] that helps with her loss of the side effect medications does not have an appointment until December 2019.  Patient is also recently started doing a ketogenic diet in January and has lost 20 pounds.  She states that this has helped her feel less lethargic during the day and overall better.  Patient is willing to slowly taper Topamax but is hesitant due to fear of migraines or seizures restarting.  Patient does take Relpax 40 mg as needed for migraines but states she is only had to take 1 about every 2 months.  Patient denies any other concerns at this time.   REVIEW OF SYSTEMS: Full 14 system review of systems performed and notable only for those listed, all others are neg:  Constitutional: neg  Cardiovascular: neg Ear/Nose/Throat: neg  Skin: neg  Eyes: neg Respiratory: neg Gastroitestinal: Constipation Hematology/Lymphatic: neg  Endocrine: neg Musculoskeletal:neg Allergy/Immunology: neg Neurological: neg Psychiatric: neg Sleep : neg   ALLERGIES: Allergies  Allergen Reactions  . No Known Allergies     HOME MEDICATIONS: Outpatient Medications Prior to Visit  Medication Sig Dispense Refill  . eletriptan (RELPAX) 40 MG tablet When you HAVE a severe migraine, take one; may take a  second dose 2 hours later, but no more than 2 doses in 24 hours or 4 doses/week.    . estradiol (ESTRACE VAGINAL) 0.1 MG/GM vaginal cream Estrace 0.01% (0.1 mg/gram) vaginal cream    . Eszopiclone 3 MG TABS Take by mouth.    . topiramate (TOPAMAX) 50 MG tablet Take 2 tablets (100 mg total) by mouth 2 (two) times daily. 360 tablet 1   Facility-Administered Medications Prior to Visit  Medication Dose Route Frequency Provider Last Rate Last Dose  . gadopentetate dimeglumine (MAGNEVIST) injection 20 mL  20 mL Intravenous Once PRN Garvin Fila, MD        PAST MEDICAL HISTORY: Past Medical History:  Diagnosis Date  . Insomnia   . Low back pain   . Menorrhagia 03/28/2012  . Migraines   . Renal calculus     PAST SURGICAL HISTORY: Past Surgical History:  Procedure Laterality Date  . DILATION AND CURETTAGE OF UTERUS    . POLYPECTOMY  03/29/2012   Procedure: POLYPECTOMY;  Surgeon: Thornell Sartorius, MD;  Location: [facility];  Service: Gynecology;  Laterality: N/A;    FAMILY HISTORY: Family History  Problem Relation Age of Onset  . Hypertension Mother   . Hypertension Father   . Stroke Maternal Grandfather     SOCIAL HISTORY: Social History   Socioeconomic History  . Marital status: Married    Spouse name: Not on file  . Number of children: Not on file  . Years of education: Not on file  . Highest education level: Not on file  Social Needs  . Financial resource strain: Not on file  . Food insecurity - worry: Not on file  . Food insecurity - inability: Not on file  . Transportation needs - medical: Not on file  . Transportation needs - non-medical: Not on file  Occupational History  . Not on file  Tobacco Use  . Smoking status: Never Smoker  . Smokeless tobacco: Never Used  Substance and Sexual Activity  . Alcohol use: No  . Drug use: No  . Sexual activity: Yes    Birth control/protection: Surgical  Other Topics Concern  . Not on file  Social History Narrative  . Not on  file     PHYSICAL EXAM  There were no vitals filed for this visit. There is no height or weight on file to calculate BMI.  Generalized: Well developed, middle-aged Caucasian female, mild hair thinning, in no acute distress  Head: normocephalic and atraumatic,. Oropharynx benign  Neck: Supple, no carotid bruits  Cardiac: Regular rate rhythm, no murmur  Musculoskeletal: No deformity   Neurological examination   Mentation: Alert oriented to time, place, history taking. Attention span and concentration appropriate. Recent and remote memory intact.  Follows all commands speech and language fluent.   Cranial nerve II-XII: Fundoscopic exam reveals sharp disc margins.Pupils were equal round reactive to light extraocular movements were full, visual field were full on confrontational test. Facial sensation and strength were normal. hearing was intact to finger rubbing bilaterally. Uvula tongue midline. head turning and shoulder shrug were normal and symmetric.Tongue protrusion into  cheek strength was normal. Motor: normal bulk and tone, full strength in the BUE, BLE, fine finger movements normal, no pronator drift. No focal weakness Sensory: normal and symmetric to light touch, pinprick, and  Vibration, proprioception  Coordination: finger-nose-finger, heel-to-shin bilaterally, no dysmetria Reflexes: Brachioradialis 2/2, biceps 2/2, triceps 2/2, patellar 2/2, Achilles 2/2, plantar responses were flexor bilaterally. Gait and Station: Rising up from seated position without assistance, normal stance,  moderate stride, good arm swing, smooth turning, able to perform tiptoe, and heel walking without difficulty. Tandem gait is steady  DIAGNOSTIC DATA (LABS, IMAGING, TESTING) - I reviewed patient records, labs, notes, testing and imaging myself where available.  Lab Results  Component Value Date   WBC 7.8 03/29/2012   HGB 13.3 03/29/2012   HCT 40.9 03/29/2012   MCV 94.7 03/29/2012   PLT 198  03/29/2012    ASSESSMENT AND PLAN 52 year old Caucasian lady with recurrent stereotypical episodes of tightening of the extremities followed by brief loss of consciousness and headaches of unclear significance. Possibilities include atypical migraine versus complex partial seizures vertebrobasilar TIAs. Long-standing history of migraine headaches. Abnormal MRI showing nonspecific white matter hyperintensities likely migraine related    PLAN: Patient is willing to start to taper Topamax dose as she is having side effects of hair loss but is hesitant at the same time due to potential return of headaches or seizures.  Patient in agreement to slowly taper and patient aware that if migraines or potential seizure activity return, we can stop taper of Topamax dose or change medication.  As patient is currently taking Topamax 100 mg twice daily, patient will continue to take 100 mg in the morning but decreased evening dose to 50 mg.  Patient is aware that she can call us if headaches return but patient will call back within 1 month to potentially taper medication further.  Patient in agreement with this plan.  Patient will return for follow-up appointment in 3 months.   Venancio Poisson, AGNP-BC  [facility] [address] [place], [place] [address]  Phone [phone] Fax [phone]

## 2017-11-17 ENCOUNTER — Ambulatory Visit (INDEPENDENT_AMBULATORY_CARE_PROVIDER_SITE_OTHER): Payer: BLUE CROSS/BLUE SHIELD | Admitting: Adult Health

## 2017-11-17 ENCOUNTER — Encounter: Payer: Self-pay | Admitting: Adult Health

## 2017-11-17 VITALS — BP 113/77 | HR 76 | Wt 183.1 lb

## 2017-11-17 DIAGNOSIS — I639 Cerebral infarction, unspecified: Secondary | ICD-10-CM

## 2017-11-17 DIAGNOSIS — G43611 Persistent migraine aura with cerebral infarction, intractable, with status migrainosus: Secondary | ICD-10-CM

## 2017-11-17 MED ORDER — TOPIRAMATE 50 MG PO TABS: ORAL_TABLET | ORAL | 1 refills | Status: DC

## 2017-11-17 NOTE — Patient Instructions (Signed)
Your Plan:  Decrease topamax: 100mg  in the morning and 50mg  in the evening  Call us in 1 month to let us know how your migraines are doing - consider another taper at that time  Follow up in 3 months or call earlier if needed    Thank you for coming to see [place] at [facility]. I hope we have been able to provide you high quality care today.  You may receive a patient satisfaction survey over the next few weeks. We would appreciate your feedback and comments so that we may continue to improve ourselves and the health of our patients.

## 2017-11-19 NOTE — Progress Notes (Signed)
I have reviewed and agreed above plan. 

## 2018-02-17 ENCOUNTER — Ambulatory Visit (INDEPENDENT_AMBULATORY_CARE_PROVIDER_SITE_OTHER): Payer: BLUE CROSS/BLUE SHIELD | Admitting: Adult Health

## 2018-02-17 ENCOUNTER — Encounter: Payer: Self-pay | Admitting: Adult Health

## 2018-02-17 VITALS — BP 116/77 | HR 66 | Ht 69.0 in | Wt 163.0 lb

## 2018-02-17 DIAGNOSIS — I639 Cerebral infarction, unspecified: Secondary | ICD-10-CM | POA: Diagnosis not present

## 2018-02-17 DIAGNOSIS — G43611 Persistent migraine aura with cerebral infarction, intractable, with status migrainosus: Secondary | ICD-10-CM | POA: Diagnosis not present

## 2018-02-17 DIAGNOSIS — R569 Unspecified convulsions: Secondary | ICD-10-CM

## 2018-02-17 MED ORDER — ELETRIPTAN HYDROBROMIDE 40 MG PO TABS: ORAL_TABLET | ORAL | 5 refills | Status: DC

## 2018-02-17 MED ORDER — TOPIRAMATE 50 MG PO TABS: 50.0000 mg | ORAL_TABLET | Freq: Two times a day (BID) | ORAL | 1 refills | Status: DC

## 2018-02-17 MED ORDER — TOPIRAMATE 50 MG PO TABS
50.0000 mg | ORAL_TABLET | Freq: Two times a day (BID) | ORAL | 2 refills | Status: DC
Start: 2018-02-17 — End: 2018-11-07

## 2018-02-17 NOTE — Patient Instructions (Addendum)
Your Plan:  Decrease topamax to 50mg  twice a day  Let us know if this causes increase in headaches/seizures and we can increase dose back at that time  Continue with weight loss - healthy eating and exercise - great job on current weight loss!    Follow up in 6 months or call earlier if needed       Thank you for coming to see [place] at [facility]. I hope we have been able to provide you high quality care today.  You may receive a patient satisfaction survey over the next few weeks. We would appreciate your feedback and comments so that we may continue to improve ourselves and the health of our patients.

## 2018-02-17 NOTE — Progress Notes (Signed)
[facility]  PATIENT: Jill Jenkins DOB: 06/12/1966   REASON FOR VISIT: Follow-up for migraine  HISTORY FROM: patient    HISTORY OF PRESENT ILLNESS: Initial Consult 05/26/2017 ;  Ms Desmith is a 52 year pleasant Caucasian lady whose had 4 episodes in the last 3 months of brief tightening of her body with clenching of hands and feet with passing out briefly.. During the first episode she was lying on the bed and had  the phone in her hand and when she noticed tightening of her extremities and she must have passed out because when she woke up she had a dog on top of her body looking at her. She had a bad headache that started behind the ears and then spread to her neck lasted several hours. She was fully aware of her surroundings. Her mind was clear she did not have any disorientation or confusion. There was no tongue bite, incontinence or injury. She has had 4 other episodes occurring roughly around every 2-3 weeks. The episodes have been not as intense and she has had no eye witness. The last episode was last week and when she was asleep she felt dizzy and had slight sensation of room spinning and felt pressure and pain behind the ears. She denies any slurred speech, double vision, gait ataxia focal extremity weakness or numbness. She has no history she has no family history of epilepsy. The patient states she is under some stress due to her marriage but denies any significant increase recently. She does take Relpax 40 mg for symptomatic relief of her migraines and it usually works quite well. She has never tried this for her headaches following these episodes.  08/19/2017 visit: She returns for follow-up after initial consultation visit 3 months ago. She states increasing the Topamax 200 g twice daily has helped. She initially had some side effects mainly his difficulty in vision but that seems to have settled down. She has noticed improvement and she had only a single episode of  her typical migraine around Thanksgiving while visiting [place]. She had MRI scan of the brain done on 06/04/17 which I have personally reviewed showed nonspecific white matter hyperintensities but no other significant abnormalities. MRI of the brain and neck both to unremarkable. She has no new neurological complaints. She denies any symptoms suggestive of diplopia, vertigo, fatigue, gait or balance problems  11/17/17 visit: Patient is being seen today for 52-month follow-up.  She states that her last migraine was prior to Thanksgiving.  She has been tolerating Topamax 100 mg twice daily well but does complain of hair loss with this medication.  She states that she has an appointment with a specialist at [facility] that helps with her loss of the side effect medications does not have an appointment until December 2019.  Patient is also recently started doing a ketogenic diet in January and has lost 20 pounds.  She states that this has helped her feel less lethargic during the day and overall better.  Patient is willing to slowly taper Topamax but is hesitant due to fear of migraines or seizures restarting.  Patient does take Relpax 40 mg as needed for migraines but states she is only had to take 1 about every 2 months.  Patient denies any other concerns at this time.  02/17/18 UPDATE: Patient returns today for 52-month follow-up.  She has tolerated decrease in Topamax dose well without any increase in headaches or seizure-like activity.  States she continues to have  hair loss.  Has recently lost 40 pounds due to diet and exercise.  She is requesting an additional increase at this time of Topamax.  Patient has been having complications of a 3-week long sinus infection where she will be going to see an ENT provider next week.  Denies any other concerns at this time    REVIEW OF SYSTEMS: Full 14 system review of systems performed and notable only for those listed, all others are neg:  No  complaints   ALLERGIES: Allergies  Allergen Reactions  . No Known Allergies     HOME MEDICATIONS: Outpatient Medications Prior to Visit  Medication Sig Dispense Refill  . estradiol (ESTRACE VAGINAL) 0.1 MG/GM vaginal cream Estrace 0.01% (0.1 mg/gram) vaginal cream    . eletriptan (RELPAX) 40 MG tablet When you HAVE a severe migraine, take one; may take a second dose 2 hours later, but no more than 2 doses in 24 hours or 4 doses/week.    . topiramate (TOPAMAX) 50 MG tablet Take 100mg  (2 tabs) in the morning and 50mg  (1 tab) at bedtime (Patient taking differently: Take 100mg  (1 tabs) in the morning and 50mg  (1 tab) at bedtime) 90 tablet 1  . Eszopiclone 3 MG TABS Take by mouth.    . Eszopiclone 3 MG TABS TAKE 1 TABLET BY MOUTH IMMEDIATELY BEFORE BEDTIME AS NEEDED FOR SLEEP     Facility-Administered Medications Prior to Visit  Medication Dose Route Frequency Provider Last Rate Last Dose  . gadopentetate dimeglumine (MAGNEVIST) injection 20 mL  20 mL Intravenous Once PRN Garvin Fila, MD        PAST MEDICAL HISTORY: Past Medical History:  Diagnosis Date  . Insomnia   . Low back pain   . Menorrhagia 03/28/2012  . Migraines   . Renal calculus     PAST SURGICAL HISTORY: Past Surgical History:  Procedure Laterality Date  . DILATION AND CURETTAGE OF UTERUS    . POLYPECTOMY  03/29/2012   Procedure: POLYPECTOMY;  Surgeon: Thornell Sartorius, MD;  Location: [facility];  Service: Gynecology;  Laterality: N/A;    FAMILY HISTORY: Family History  Problem Relation Age of Onset  . Hypertension Mother   . Hypertension Father   . Stroke Maternal Grandfather     SOCIAL HISTORY: Social History   Socioeconomic History  . Marital status: Married    Spouse name: Not on file  . Number of children: Not on file  . Years of education: Not on file  . Highest education level: Not on file  Occupational History  . Not on file  Social Needs  . Financial resource strain: Not on file  . Food  insecurity:    Worry: Not on file    Inability: Not on file  . Transportation needs:    Medical: Not on file    Non-medical: Not on file  Tobacco Use  . Smoking status: Never Smoker  . Smokeless tobacco: Never Used  Substance and Sexual Activity  . Alcohol use: No  . Drug use: No  . Sexual activity: Yes    Birth control/protection: Surgical  Lifestyle  . Physical activity:    Days per week: Not on file    Minutes per session: Not on file  . Stress: Not on file  Relationships  . Social connections:    Talks on phone: Not on file    Gets together: Not on file    Attends religious service: Not on file    Active member of club or  organization: Not on file    Attends meetings of clubs or organizations: Not on file    Relationship status: Not on file  . Intimate partner violence:    Fear of current or ex partner: Not on file    Emotionally abused: Not on file    Physically abused: Not on file    Forced sexual activity: Not on file  Other Topics Concern  . Not on file  Social History Narrative  . Not on file     PHYSICAL EXAM  Vitals:   02/17/18 1106  BP: 116/77  Pulse: 66  Weight: 163 lb (73.9 kg)  Height: 5\' 9"  (1.753 m)   Body mass index is 24.07 kg/m.  Generalized: Well developed, middle-aged Caucasian female, mild hair thinning, in no acute distress  Head: normocephalic and atraumatic,. Oropharynx benign  Neck: Supple, no carotid bruits  Cardiac: Regular rate rhythm, no murmur  Musculoskeletal: No deformity   Neurological examination   Mentation: Alert oriented to time, place, history taking. Attention span and concentration appropriate. Recent and remote memory intact.  Follows all commands speech and language fluent.   Cranial nerve II-XII: Fundoscopic exam reveals sharp disc margins.Pupils were equal round reactive to light extraocular movements were full, visual field were full on confrontational test. Facial sensation and strength were normal. hearing  was intact to finger rubbing bilaterally. Uvula tongue midline. head turning and shoulder shrug were normal and symmetric.Tongue protrusion into cheek strength was normal. Motor: normal bulk and tone, full strength in the BUE, BLE, fine finger movements normal, no pronator drift. No focal weakness Sensory: normal and symmetric to light touch, pinprick, and  Vibration, proprioception  Coordination: finger-nose-finger, heel-to-shin bilaterally, no dysmetria Reflexes: Brachioradialis 2/2, biceps 2/2, triceps 2/2, patellar 2/2, Achilles 2/2, plantar responses were flexor bilaterally. Gait and Station: Rising up from seated position without assistance, normal stance,  moderate stride, good arm swing, smooth turning, able to perform tiptoe, and heel walking without difficulty. Tandem gait is steady  DIAGNOSTIC DATA (LABS, IMAGING, TESTING) - I reviewed patient records, labs, notes, testing and imaging myself where available.  No recent imaging  ASSESSMENT AND PLAN 52 year old Caucasian lady with recurrent stereotypical episodes of tightening of the extremities followed by brief loss of consciousness and headaches of unclear significance. Possibilities include atypical migraine versus complex partial seizures vertebrobasilar TIAs. Long-standing history of migraine headaches. Abnormal MRI showing nonspecific white matter hyperintensities likely migraine related.  Patient returns today for 4-month follow-up after tapering Topamax dosage for possible side effects of hair loss.    PLAN: Decrease Topamax to 50 mg twice daily - advised patient to call if she would like to taper Topamax further prior to follow-up appointment Advised patient to notify us if she has increase in headaches/seizures and we can increase dose back at that time Advised to continue weight loss through healthy eating and exercise  Patient will follow-up in 6 months or call earlier if needed  Greater than 50% of this 25-minute visit  was spent on educating patient on tapering Topamax, side effects to be aware of and when to notify us.  Patient verbalized understanding.   Venancio Poisson, AGNP-BC  [facility] [address] [place], [place] [address]  Phone [phone] Fax [phone]

## 2018-02-18 NOTE — Progress Notes (Signed)
I agree with the above plan 

## 2018-07-27 ENCOUNTER — Telehealth: Payer: Self-pay | Admitting: Adult Health

## 2018-07-27 NOTE — Telephone Encounter (Signed)
Rn call patient about stating she had a seizure yesterday and did not seek the emergency room. PT described as the same symptoms she had yesterday which KDT:OIZTI tightening of her body with clenching of hands and feet with passing out briefly. Pt stated it began with tightness in her feet and it radiate through her body to her head. Than her extremities began to move.PT stated no one witness this episode because she was home alone.Rn ask if she was alert during this episode. Pt stated she was alert and knew what was going on. She pass out briefly. Rn ask how long did the episode occur. Pt stated she was not sure maybe estimation 10 minutes. Pt stated she was dizzy weeks prior to the episode yesterday. Rn stated message will be sent to [facility] NP. PT verbalized understanding.

## 2018-07-27 NOTE — Telephone Encounter (Signed)
Pt has called to inform NP Janett Billow that out of nowhere she had a seizure yesterday and she'd like a call back to discuss,pt did not go to ER, it came and went

## 2018-07-27 NOTE — Telephone Encounter (Signed)
Per your note, same type of episode that she was initially seen in this office for that was felt to be migraine related.  As she has been stable, we have attempted to start to decrease Topamax per her request.  Currently on Topamax 50 mg twice daily.  At this time, recommend to increase nighttime dose to 100 mg and continue morning dose at 50 mg.  If she has further questions or concerns, please schedule follow-up appointment if needed.

## 2018-07-28 NOTE — Telephone Encounter (Addendum)
RN call patient about Janett Billow NP recommendations. RN stated the episode is similar in description when she was first seen by Dr. Leonie Man 05/2017.RN stated it was related to migraine. Rn stated its recommend she take 50mg  topamax,am,and 100 at pm. PT verbalized understanding. RN remind pt to call back if she has any other issues. PT has an appt on 08/24/2018 with Janett Billow NP already schedule.

## 2018-08-24 ENCOUNTER — Encounter: Payer: Self-pay | Admitting: Adult Health

## 2018-08-24 ENCOUNTER — Ambulatory Visit (INDEPENDENT_AMBULATORY_CARE_PROVIDER_SITE_OTHER): Payer: BLUE CROSS/BLUE SHIELD | Admitting: Adult Health

## 2018-08-24 VITALS — BP 117/69 | HR 72 | Ht 69.0 in | Wt 164.8 lb

## 2018-08-24 DIAGNOSIS — I639 Cerebral infarction, unspecified: Secondary | ICD-10-CM

## 2018-08-24 DIAGNOSIS — G43611 Persistent migraine aura with cerebral infarction, intractable, with status migrainosus: Secondary | ICD-10-CM | POA: Diagnosis not present

## 2018-08-24 MED ORDER — ELETRIPTAN HYDROBROMIDE 40 MG PO TABS: ORAL_TABLET | ORAL | 5 refills | Status: DC

## 2018-08-24 NOTE — Patient Instructions (Addendum)
Your Plan:  Continue prior dose of topamax 50mg  twice a day  We can consider starting topirmate ER (extented release) in the future if needed   After 3-4 months if you have not experienced migraine and/or symptoms, please let us know and we can consider decreasing further  Continue relpax as needed for migraine management    Follow up in 6 months or call earlier if needed       Thank you for coming to see [place] at [facility]. I hope we have been able to provide you high quality care today.  You may receive a patient satisfaction survey over the next few weeks. We would appreciate your feedback and comments so that we may continue to improve ourselves and the health of our patients.

## 2018-08-24 NOTE — Progress Notes (Signed)
[facility]  PATIENT: Jill Jenkins DOB: 12-Nov-1965   REASON FOR VISIT: Follow-up for migraine  HISTORY FROM: patient  Chief Complaint  Patient presents with  . Follow-up    MIgraine and seziure like activiity  room in back hallway pt alone     HISTORY OF PRESENT ILLNESS: Initial Consult 05/26/2017 ;  Jill Jenkins is a 25 year pleasant Caucasian lady whose had 4 episodes in the last 3 months of brief tightening of her body with clenching of hands and feet with passing out briefly.. During the first episode she was lying on the bed and had  the phone in her hand and when she noticed tightening of her extremities and she must have passed out because when she woke up she had a dog on top of her body looking at her. She had a bad headache that started behind the ears and then spread to her neck lasted several hours. She was fully aware of her surroundings. Her mind was clear she did not have any disorientation or confusion. There was no tongue bite, incontinence or injury. She has had 4 other episodes occurring roughly around every 2-3 weeks. The episodes have been not as intense and she has had no eye witness. The last episode was last week and when she was asleep she felt dizzy and had slight sensation of room spinning and felt pressure and pain behind the ears. She denies any slurred speech, double vision, gait ataxia focal extremity weakness or numbness. She has no history she has no family history of epilepsy. The patient states she is under some stress due to her marriage but denies any significant increase recently. She does take Relpax 40 mg for symptomatic relief of her migraines and it usually works quite well. She has never tried this for her headaches following these episodes.  08/19/2017 visit: She returns for follow-up after initial consultation visit 3 months ago. She states increasing the Topamax 200 g twice daily has helped. She initially had some side effects mainly  his difficulty in vision but that seems to have settled down. She has noticed improvement and she had only a single episode of her typical migraine around Thanksgiving while visiting [place]. She had MRI scan of the brain done on 06/04/17 which I have personally reviewed showed nonspecific white matter hyperintensities but no other significant abnormalities. MRI of the brain and neck both to unremarkable. She has no new neurological complaints. She denies any symptoms suggestive of diplopia, vertigo, fatigue, gait or balance problems  11/17/17 visit: Patient is being seen today for 4-month follow-up.  She states that her last migraine was prior to Thanksgiving.  She has been tolerating Topamax 100 mg twice daily well but does complain of hair loss with this medication.  She states that she has an appointment with a specialist at [facility] that helps with her loss of the side effect medications does not have an appointment until December 2019.  Patient is also recently started doing a ketogenic diet in January and has lost 20 pounds.  She states that this has helped her feel less lethargic during the day and overall better.  Patient is willing to slowly taper Topamax but is hesitant due to fear of migraines or seizures restarting.  Patient does take Relpax 40 mg as needed for migraines but states she is only had to take 1 about every 2 months.  Patient denies any other concerns at this time.  02/17/18 UPDATE: Patient returns today  for 40-month follow-up.  She has tolerated decrease in Topamax dose well without any increase in headaches or seizure-like activity.  States she continues to have hair loss.  Has recently lost 40 pounds due to diet and exercise.  She is requesting an additional increase at this time of Topamax.  Patient has been having complications of a 3-week long sinus infection where she will be going to see an ENT provider next week.  Denies any other concerns at this time  Interval history  08/24/2018: Patient is being seen today for follow-up visit.  Patient called office on 07/27/2018 stating since she had a seizure consisting of brief tightening of her body with clenching of her hands and feet with possible "passing out" per patient which lasted approximately 10 minutes.  This episode consisted greatly of prior episodes which were likely related to her complicated migraines.  She denies any migraine present at the time of this event but does endorse a migraine occurring approximately 1 week after.  It was recommended at that time to increase nighttime Topamax dose from 50 mg to 100 mg and continue with 50 mg a.m. Dose.  She continues to Relpax 40mg  as needed for migraines which she takes approx every 3-4 months with benefit.  She denies any other migraine or complicated migraine symptoms since prior visit.  She did bring a list from her PCP to consider starting her on Trokendi or Aimovig for migraine prophylaxis due to side effects.  She has trialed amitriptyline in the past and has been on Topamax for greater than 20 years.  She does continue to have difficulties with sleeping and has attempted using Ambien, trazodone and amitriptyline as prescribed by her PCP.  She denies any depression and/or anxiety symptoms.  She feels as though her migraines are well controlled and as they occur infrequently, she is requesting decrease of Topamax dose as she had side effects such as "fogginess" and hair loss.  No further concerns at this time.     REVIEW OF SYSTEMS: Full 14 system review of systems performed and notable only for those listed, all others are neg:  Insomnia   ALLERGIES: Allergies  Allergen Reactions  . No Known Allergies     HOME MEDICATIONS: Outpatient Medications Prior to Visit  Medication Sig Dispense Refill  . eletriptan (RELPAX) 40 MG tablet When you HAVE a severe migraine, take one; may take a second dose 2 hours later, but no more than 2 doses in 24 hours or 4  doses/week. 10 tablet 5  . estradiol (ESTRACE VAGINAL) 0.1 MG/GM vaginal cream Estrace 0.01% (0.1 mg/gram) vaginal cream    . topiramate (TOPAMAX) 50 MG tablet Take 1 tablet (50 mg total) by mouth 2 (two) times daily. (Patient taking differently: Take 50 mg by mouth 2 (two) times daily. ) 180 tablet 2  . Tretinoin Microsphere (RETIN-A MICRO PUMP) 0.06 % GEL Retin-A Micro Pump 0.06 % topical gel  1 APPLICATION THIN LAYER TO FACE NIGHTLY    . estradiol (ESTRACE VAGINAL) 0.1 MG/GM vaginal cream Estrace 0.01% (0.1 mg/gram) vaginal cream    . fluconazole (DIFLUCAN) 150 MG tablet fluconazole 150 mg tablet    . UNABLE TO FIND Gummie multivitamin daily.    . valACYclovir (VALTREX) 1000 MG tablet valacyclovir 1 gram tablet     Facility-Administered Medications Prior to Visit  Medication Dose Route Frequency Provider Last Rate Last Dose  . gadopentetate dimeglumine (MAGNEVIST) injection 20 mL  20 mL Intravenous Once PRN Garvin Fila, MD  PAST MEDICAL HISTORY: Past Medical History:  Diagnosis Date  . Insomnia   . Low back pain   . Menorrhagia 03/28/2012  . Migraines   . Renal calculus     PAST SURGICAL HISTORY: Past Surgical History:  Procedure Laterality Date  . DILATION AND CURETTAGE OF UTERUS    . POLYPECTOMY  03/29/2012   Procedure: POLYPECTOMY;  Surgeon: Thornell Sartorius, MD;  Location: [facility];  Service: Gynecology;  Laterality: N/A;    FAMILY HISTORY: Family History  Problem Relation Age of Onset  . Hypertension Mother   . Hypertension Father   . Stroke Maternal Grandfather     SOCIAL HISTORY: Social History   Socioeconomic History  . Marital status: Married    Spouse name: Not on file  . Number of children: Not on file  . Years of education: Not on file  . Highest education level: Not on file  Occupational History  . Not on file  Social Needs  . Financial resource strain: Not on file  . Food insecurity:    Worry: Not on file    Inability: Not on file  .  Transportation needs:    Medical: Not on file    Non-medical: Not on file  Tobacco Use  . Smoking status: Never Smoker  . Smokeless tobacco: Never Used  Substance and Sexual Activity  . Alcohol use: No  . Drug use: No  . Sexual activity: Yes    Birth control/protection: Surgical  Lifestyle  . Physical activity:    Days per week: Not on file    Minutes per session: Not on file  . Stress: Not on file  Relationships  . Social connections:    Talks on phone: Not on file    Gets together: Not on file    Attends religious service: Not on file    Active member of club or organization: Not on file    Attends meetings of clubs or organizations: Not on file    Relationship status: Not on file  . Intimate partner violence:    Fear of current or ex partner: Not on file    Emotionally abused: Not on file    Physically abused: Not on file    Forced sexual activity: Not on file  Other Topics Concern  . Not on file  Social History Narrative  . Not on file     PHYSICAL EXAM  Vitals:   08/24/18 1105  BP: 117/69  Pulse: 72  Weight: 164 lb 12.8 oz (74.8 kg)  Height: 5\' 9"  (1.753 m)   Body mass index is 24.34 kg/m.  Generalized: Well developed, middle-aged Caucasian female, mild hair thinning, in no acute distress  Head: normocephalic and atraumatic Neck: Supple, no carotid bruits  Cardiac: Regular rate rhythm, no murmur  Musculoskeletal: No deformity   Neurological examination   Mentation: Alert oriented to time, place, history taking. Attention span and concentration appropriate. Recent and remote memory intact.  Follows all commands speech and language fluent.   Cranial nerve II-XII: Fundoscopic exam reveals sharp disc margins.Pupils were equal round reactive to light extraocular movements were full, visual field were full on confrontational test. Facial sensation and strength were normal. hearing was intact to finger rubbing bilaterally. Uvula tongue midline. head turning and  shoulder shrug were normal and symmetric.Tongue protrusion into cheek strength was normal. Motor: normal bulk and tone, full strength in the BUE, BLE, fine finger movements normal, no pronator drift. No focal weakness Sensory: normal and symmetric to light  touch, pinprick, and  Vibration, proprioception  Coordination: finger-nose-finger, heel-to-shin bilaterally, no dysmetria Reflexes: Brachioradialis 2/2, biceps 2/2, triceps 2/2, patellar 2/2, Achilles 2/2, plantar responses were flexor bilaterally. Gait and Station: Rising up from seated position without assistance, normal stance,  moderate stride, good arm swing, smooth turning, able to perform tiptoe, and heel walking without difficulty. Tandem gait is steady  DIAGNOSTIC DATA (LABS, IMAGING, TESTING) - I reviewed patient records, labs, notes, testing and imaging myself where available.  No recent imaging  ASSESSMENT AND PLAN 52 year old Caucasian lady with recurrent stereotypical episodes of tightening of the extremities followed by brief loss of consciousness and headaches of unclear significance. Possibilities include atypical migraine versus complex partial seizures vertebrobasilar TIAs. Long-standing history of migraine headaches. Abnormal MRI showing nonspecific white matter hyperintensities likely migraine related.  Patient is being seen today for follow-up visit.  She did have one episode of complicated migraine since prior visit but otherwise has been well controlled on Topamax.    PLAN: Recommend mended to restart prior dosage of Topamax 50 mg twice daily -advised patient to continue this dose for approximately 3 to 4 months and if her migraines continue to be well controlled, we can consider decreasing further.  As patient is attempting to completely wean off the Topamax, we will hold off on starting Trokendi at this time but will consider in the future if needed -Advised patient that she is not a candidate for Aimovig as she does  not have enough of migraine days and has not trialed greater than 3 migraine medications as required by insurance -patient verbalized understanding -Advised patient to call office with worsening of her migraines and/or complicated migraines.  If we are unable to completely discontinue Topamax, we can consider Trokendi as she can have potential less side effects but it was also discussed that we can trial other migraine prophylaxis medications in the future if needed   Patient will follow-up in 6 months or call earlier if needed  Greater than 50% of this 25-minute visit was spent on educating patient on tapering Topamax, potential use of other migraine prophylaxis medication, side effects to be aware of and when to notify us.  Patient verbalized understanding.   Venancio Poisson, AGNP-BC  [facility] [address] [place], [place] [address]  Phone [phone] Fax [phone]

## 2018-08-24 NOTE — Progress Notes (Signed)
I agree with the above plan 

## 2018-11-07 ENCOUNTER — Encounter: Payer: Self-pay | Admitting: Neurology

## 2018-11-07 ENCOUNTER — Ambulatory Visit (INDEPENDENT_AMBULATORY_CARE_PROVIDER_SITE_OTHER): Payer: BLUE CROSS/BLUE SHIELD | Admitting: Neurology

## 2018-11-07 VITALS — BP 116/74 | HR 77 | Ht 69.0 in | Wt 169.2 lb

## 2018-11-07 DIAGNOSIS — R251 Tremor, unspecified: Secondary | ICD-10-CM | POA: Diagnosis not present

## 2018-11-07 DIAGNOSIS — R413 Other amnesia: Secondary | ICD-10-CM | POA: Diagnosis not present

## 2018-11-07 DIAGNOSIS — H81399 Other peripheral vertigo, unspecified ear: Secondary | ICD-10-CM

## 2018-11-07 DIAGNOSIS — R253 Fasciculation: Secondary | ICD-10-CM | POA: Diagnosis not present

## 2018-11-07 DIAGNOSIS — R42 Dizziness and giddiness: Secondary | ICD-10-CM | POA: Diagnosis not present

## 2018-11-07 MED ORDER — TOPIRAMATE 50 MG PO TABS: 25.0000 mg | ORAL_TABLET | Freq: Two times a day (BID) | ORAL | 2 refills | Status: DC

## 2018-11-07 NOTE — Patient Instructions (Signed)
I had a long discussion with the patient regarding her new multifocal symptoms of intermittent dizziness, tremulousness, muscle twitching and mild memory impairment which appear to be of unclear significance and need further work-up.  I recommend we check complete metabolic panel, CBC, TSH, vitamin B12 and order MRI scan of the brain.  I recommend we reduce the dose of Topamax to 25 mg twice daily as her migraines seem very well controlled and her subjective memory difficulties may be related to Topamax.  She is worried about family history of Parkinson's but I reassured her that her clinical symptoms and exams are not suggestive of that disease.  She was advised to increase participation in activities for stress relaxation like yoga, meditation, regular exercise.  She will keep her scheduled appointment in June with Janett Billow my nurse practitioner call earlier if necessary.

## 2018-11-07 NOTE — Progress Notes (Signed)
[facility] [address] [place] [address] [phone]       OFFICE CONSULT NOTE  Jill Jenkins Date of Birth:  1966-01-19 Medical Record Number:  [id number]   Referring MD: Cliffton Asters, PA-C  Reason for Referral: Dizziness, tremors, twitching's  HPI: Jill Jenkins is referred today for evaluation for new complaints of dizziness, tremors, twitching's and memory loss for the last 3 to 4 months.  History is obtained from the patient and review of electronic medical records.  I have personally reviewed imaging films in PACS.  She is known to [place] for prior visits for a typical migraine and was last seen on 08/24/2018 by Venancio Poisson nurse practitioner.  States her migraine headaches seem to be under good control and presently she takes Topamax 50 mg twice daily and gets headaches only once every 2 3 months or so.  Her main complaint today is that for the last 4 to 6 months she has had some progressively worsening symptoms.  She feels dizzy off balance.  She does yoga 4 days a week and at times noticed that she has to hold on to avoid falls.  She has had a few minor falls but no major injuries.  She denies true vertigo or presyncopal symptoms or loss of consciousness.  She is also noticed a mild tremor of her right hand off-and-on.  It is very fine and intermittent and does not interfere with activities of daily living.  She also feels that her body is quite rigid and at times she is grinding her teeth.  She is also noticed occasional twitching's of the muscles involving her shoulder and the neck.  She also complains of mild short-term memory difficulties.  She denies any prior history of depression or anxiety but feels that she has a underlying fear that she may develop Parkinson's disease as her mother suffered from this disease.  She has lost about 50 pounds in the last 1 year on a strict keto diet and she states she is eating healthy.  She denies any significant  headaches, loss of consciousness, double vision, gait or balance difficulties.  She has had no recent lab work or brain imaging study done.  ROS:   14 system review of systems is positive for insomnia, joint pain, dizziness, tremors, memory loss, anxiety and all other systems negative  PMH:  Past Medical History:  Diagnosis Date  . Insomnia   . Low back pain   . Menorrhagia 03/28/2012  . Migraines   . Renal calculus     Social History:  Social History   Socioeconomic History  . Marital status: Married    Spouse name: Not on file  . Number of children: Not on file  . Years of education: Not on file  . Highest education level: Not on file  Occupational History  . Not on file  Social Needs  . Financial resource strain: Not on file  . Food insecurity:    Worry: Not on file    Inability: Not on file  . Transportation needs:    Medical: Not on file    Non-medical: Not on file  Tobacco Use  . Smoking status: Never Smoker  . Smokeless tobacco: Never Used  Substance and Sexual Activity  . Alcohol use: No  . Drug use: No  . Sexual activity: Yes    Birth control/protection: Surgical  Lifestyle  . Physical activity:    Days per week: Not on file  Minutes per session: Not on file  . Stress: Not on file  Relationships  . Social connections:    Talks on phone: Not on file    Gets together: Not on file    Attends religious service: Not on file    Active member of club or organization: Not on file    Attends meetings of clubs or organizations: Not on file    Relationship status: Not on file  . Intimate partner violence:    Fear of current or ex partner: Not on file    Emotionally abused: Not on file    Physically abused: Not on file    Forced sexual activity: Not on file  Other Topics Concern  . Not on file  Social History Narrative  . Not on file    Medications:   Current Outpatient Medications on File Prior to Visit  Medication Sig Dispense Refill  . eletriptan  (RELPAX) 40 MG tablet When you HAVE a severe migraine, take one; may take a second dose 2 hours later, but no more than 2 doses in 24 hours or 4 doses/week. 10 tablet 5  . estradiol (ESTRACE VAGINAL) 0.1 MG/GM vaginal cream Estrace 0.01% (0.1 mg/gram) vaginal cream    . Suvorexant (BELSOMRA) 10 MG TABS Take by mouth at bedtime.    . Tretinoin Microsphere (RETIN-A MICRO PUMP) 0.06 % GEL Retin-A Micro Pump 0.06 % topical gel  1 APPLICATION THIN LAYER TO FACE NIGHTLY     Current Facility-Administered Medications on File Prior to Visit  Medication Dose Route Frequency Provider Last Rate Last Dose  . gadopentetate dimeglumine (MAGNEVIST) injection 20 mL  20 mL Intravenous Once PRN Garvin Fila, MD        Allergies:   Allergies  Allergen Reactions  . No Known Allergies     Physical Exam General: well developed, well nourished, seated, in no evident distress Head: head normocephalic and atraumatic.   Neck: supple with no carotid or supraclavicular bruits Cardiovascular: regular rate and rhythm, no murmurs Musculoskeletal: no deformity Skin:  no rash/petichiae Vascular:  Normal pulses all extremities  Neurologic Exam Mental Status: Awake and fully alert. Oriented to place and time. Recent and remote memory intact. Attention span, concentration and fund of knowledge appropriate. Mood and affect appropriate.  She appears anxious.  Mini-Mental status exam she scored 27/30 with 1 deficits in orientation and recall.  She was able to name 8 four-legged animals.  Clock drawing score was 4/4.  On the depression scale she scored 7 suggestive of mild depression Cranial Nerves: Fundoscopic exam reveals sharp disc margins. Pupils equal, briskly reactive to light. Extraocular movements full without nystagmus. Visual fields full to confrontation. Hearing intact. Facial sensation intact. Face, tongue, palate moves normally and symmetrically.  Motor: Normal bulk and tone. Normal strength in all tested  extremity muscles. Sensory.: intact to touch , pinprick , position and vibratory sensation.  Coordination: Rapid alternating movements normal in all extremities. Finger-to-nose and heel-to-shin performed accurately bilaterally. Gait and Station: Arises from chair without difficulty. Stance is normal. Gait demonstrates normal stride length and balance . Able to heel, toe and tandem walk without difficulty.  Reflexes: 1+ and symmetric. Toes downgoing.       ASSESSMENT: 53 year old Caucasian lady with 3 to 30-month history of progressive intermittent symptoms of dizziness, imbalance, tremors, muscle twitching's and memory difficulties of unclear etiology.  She has prior history of her typical migraines and an abnormal MRI showing nonspecific white matter hyperintensities likely migraine related.  Her migraines seem well controlled on Topamax     PLAN: I had a long discussion with the patient regarding her new multifocal symptoms of intermittent dizziness, tremulousness, muscle twitching and mild memory impairment which appear to be of unclear significance and need further work-up.  I recommend we check complete metabolic panel, CBC, TSH, vitamin B12 and order MRI scan of the brain.  I recommend we reduce the dose of Topamax to 25 mg twice daily as her migraines seem very well controlled and her subjective memory difficulties may be related to Topamax.  She is worried about family history of Parkinson's but I reassured her that her clinical symptoms and exams are not suggestive of that disease.  She was advised to increase participation in activities for stress relaxation like yoga, meditation, regular exercise.  Greater than 50% time during this 45-minute consultation visit was spent on counseling and coordination of care about her multifocal symptoms and migraines and answering questions she will keep her scheduled appointment in June with Janett Billow my nurse practitioner call earlier if necessary. Antony Contras, MD  [facility] [address] [place],  [address]  Phone [phone] Fax [phone] Note: This document was prepared with digital dictation and possible smart phrase technology. Any transcriptional errors that result from this process are unintentional.

## 2018-11-09 ENCOUNTER — Telehealth: Payer: Self-pay | Admitting: Neurology

## 2018-11-09 NOTE — Telephone Encounter (Signed)
MR Brain w/wo contrast Dr. Valentina Gu Auth: Nice Ref # [id number]. Patient is scheduled at [facility] for 11/16/18

## 2018-11-10 ENCOUNTER — Telehealth: Payer: Self-pay | Admitting: *Deleted

## 2018-11-10 NOTE — Telephone Encounter (Signed)
-----   Message from Garvin Fila, MD sent at 11/09/2018  4:41 PM EST ----- And inform the patient that most of the lab work is back and appears normal

## 2018-11-10 NOTE — Telephone Encounter (Signed)
Called and spoke with pt about results per Dr. Leonie Man note. She verbalized understanding.

## 2018-11-15 LAB — TSH: TSH: 1.66 u[IU]/mL (ref 0.450–4.500)

## 2018-11-15 LAB — VITAMIN B12: VITAMIN B 12: 360 pg/mL (ref 232–1245)

## 2018-11-15 LAB — COMPREHENSIVE METABOLIC PANEL
ALBUMIN: 4.3 g/dL (ref 3.8–4.9)
ALT: 16 IU/L (ref 0–32)
AST: 13 IU/L (ref 0–40)
Albumin/Globulin Ratio: 2.5 — ABNORMAL HIGH (ref 1.2–2.2)
Alkaline Phosphatase: 51 IU/L (ref 39–117)
BUN / CREAT RATIO: 18 (ref 9–23)
BUN: 14 mg/dL (ref 6–24)
Bilirubin Total: 0.2 mg/dL (ref 0.0–1.2)
CO2: 19 mmol/L — AB (ref 20–29)
CREATININE: 0.77 mg/dL (ref 0.57–1.00)
Calcium: 9 mg/dL (ref 8.7–10.2)
Chloride: 106 mmol/L (ref 96–106)
GFR calc non Af Amer: 89 mL/min/{1.73_m2} (ref >59)
GFR, EST AFRICAN AMERICAN: 103 mL/min/{1.73_m2} (ref >59)
GLUCOSE: 81 mg/dL (ref 65–99)
Globulin, Total: 1.7 g/dL (ref 1.5–4.5)
Potassium: 4.3 mmol/L (ref 3.5–5.2)
Sodium: 140 mmol/L (ref 134–144)
TOTAL PROTEIN: 6 g/dL (ref 6.0–8.5)

## 2018-11-15 LAB — CBC
HEMATOCRIT: 40.5 % (ref 34.0–46.6)
HEMOGLOBIN: 14.2 g/dL (ref 11.1–15.9)
MCH: 33.4 pg — ABNORMAL HIGH (ref 26.6–33.0)
MCHC: 35.1 g/dL (ref 31.5–35.7)
MCV: 95 fL (ref 79–97)
Platelets: 189 10*3/uL (ref 150–450)
RBC: 4.25 x10E6/uL (ref 3.77–5.28)
RDW: 11.1 % — AB (ref 11.7–15.4)
WBC: 7.1 10*3/uL (ref 3.4–10.8)

## 2018-11-15 LAB — VITAMIN B1

## 2018-11-16 ENCOUNTER — Other Ambulatory Visit: Payer: Self-pay | Admitting: Neurology

## 2018-11-16 ENCOUNTER — Ambulatory Visit: Payer: BLUE CROSS/BLUE SHIELD

## 2018-11-16 DIAGNOSIS — H81399 Other peripheral vertigo, unspecified ear: Secondary | ICD-10-CM

## 2018-11-28 ENCOUNTER — Telehealth: Payer: Self-pay

## 2018-11-28 NOTE — Telephone Encounter (Signed)
-----   Message from Garvin Fila, MD sent at 11/27/2018 10:53 AM EDT ----- Jill Jenkins inform the patient that MRI scan of the brain was unremarkable. No significant change from previous MRI from September 2018.No worrisome finding

## 2018-11-28 NOTE — Telephone Encounter (Signed)
Notes recorded by Marval Regal, RN on 11/28/2018 at 11:22 AM EDT I called patient that the MRI of the brain was unremarkable. There was no significant change from the previous MRI from No worrisome findings. PT verbalized understanding. ------

## 2018-12-19 ENCOUNTER — Other Ambulatory Visit: Payer: Self-pay | Admitting: Adult Health

## 2018-12-19 NOTE — Telephone Encounter (Signed)
Open in error

## 2019-02-21 ENCOUNTER — Telehealth: Payer: Self-pay

## 2019-02-21 NOTE — Telephone Encounter (Signed)
Left vm for patient to call back that her in office visit will be change to video duet to COVID 19 pandemic. I stated this is due to minimize any risk for pt and our healthcare workers. I stated we need verbal consent to do video and to file insurance. If she has a cell phone with camera that can be use. Need to know her cell phone carrier on where to text link too. LEft [facility] number for a call back.

## 2019-02-22 NOTE — Telephone Encounter (Signed)
I called pt that vm was left 6/9 about office visit being change to virtual video due to COVID-19. Pt gave verbal consent to do video and to file insurance. She has a iphone with [employer] carrier. I explain the doxy process and to click link in text message 10 minutes prior to appt time. I text pt the link today and she verbalized understanding.

## 2019-02-23 ENCOUNTER — Ambulatory Visit (INDEPENDENT_AMBULATORY_CARE_PROVIDER_SITE_OTHER): Payer: BC Managed Care – PPO | Admitting: Adult Health

## 2019-02-23 ENCOUNTER — Encounter: Payer: Self-pay | Admitting: Adult Health

## 2019-02-23 ENCOUNTER — Other Ambulatory Visit: Payer: Self-pay

## 2019-02-23 DIAGNOSIS — G43709 Chronic migraine without aura, not intractable, without status migrainosus: Secondary | ICD-10-CM

## 2019-02-23 MED ORDER — TOPIRAMATE 25 MG PO TABS: 25.0000 mg | ORAL_TABLET | Freq: Two times a day (BID) | ORAL | 3 refills | Status: DC

## 2019-02-23 NOTE — Progress Notes (Signed)
I agree with the above plan 

## 2019-02-23 NOTE — Progress Notes (Signed)
[facility] [address] [place]. [place] [address] [phone]       FOLLOW UP NOTE  Jill Jenkins Date of Birth:  Jul 08, 1966 Medical Record Number:  [id number]   Referring MD: Cliffton Asters, PA-C  Reason for Referral: Dizziness, tremors, twitching's  Virtual Visit via Video Note  I connected with Jill Jenkins on 02/23/19 at 11:15 AM EDT by a video enabled telemedicine application located remotely in my own home and verified that I am speaking with the correct person using two identifiers who was located at their own home.   I discussed the limitations of evaluation and management by telemedicine and the availability of in person appointments. The patient expressed understanding and agreed to proceed.    HPI:  02/23/19 VIRTUAL VISIT She is being seen today for follow-up regarding complaints of dizziness, tremors, memory twitching and mild memory impairment.  Extensive lab work-up unremarkable and repeat MRI without acute or new findings compared to prior MRI.  It was recommended at prior visit to decrease Topamax dose from 50 mg twice daily to 25 mg twice daily as migraines have been stable.  She did not end up decreasing Topamax dose as she had a lot of 50 mg capsules still and wanted to finish those prior to decreasing dose.  She continues on Topamax 25 mg twice daily.  All prior symptoms have subsided without intervention.  Migraines have been stable and will experience occasional migraine with weather changes.  No further concerns at this time.     11/07/2018 visit Dr. Leonie Jenkins: Jill Jenkins is referred today for evaluation for new complaints of dizziness, tremors, twitching's and memory loss for the last 3 to 4 months.  History is obtained from the patient and review of electronic medical records.  I have personally reviewed imaging films in PACS.  She is known to [place] for prior visits for a typical migraine and was last seen on 08/24/2018 by Venancio Poisson  nurse practitioner.  States her migraine headaches seem to be under good control and presently she takes Topamax 50 mg twice daily and gets headaches only once every 2 3 months or so.  Her main complaint today is that for the last 4 to 6 months she has had some progressively worsening symptoms.  She feels dizzy off balance.  She does yoga 4 days a week and at times noticed that she has to hold on to avoid falls.  She has had a few minor falls but no major injuries.  She denies true vertigo or presyncopal symptoms or loss of consciousness.  She is also noticed a mild tremor of her right hand off-and-on.  It is very fine and intermittent and does not interfere with activities of daily living.  She also feels that her body is quite rigid and at times she is grinding her teeth.  She is also noticed occasional twitching's of the muscles involving her shoulder and the neck.  She also complains of mild short-term memory difficulties.  She denies any prior history of depression or anxiety but feels that she has a underlying fear that she may develop Parkinson's disease as her mother suffered from this disease.  She has lost about 50 pounds in the last 1 year on a strict keto diet and she states she is eating healthy.  She denies any significant headaches, loss of consciousness, double vision, gait or balance difficulties.  She has had no recent lab work or brain imaging study done.  ROS:   14 system review of systems is positive for no complaints and all other systems negative  PMH:  Past Medical History:  Diagnosis Date  . Insomnia   . Low back pain   . Menorrhagia 03/28/2012  . Migraines   . Renal calculus     Social History:  Social History   Socioeconomic History  . Marital status: Married    Spouse name: Not on file  . Number of children: Not on file  . Years of education: Not on file  . Highest education level: Not on file  Occupational History  . Not on file  Social Needs  .  Financial resource strain: Not on file  . Food insecurity    Worry: Not on file    Inability: Not on file  . Transportation needs    Medical: Not on file    Non-medical: Not on file  Tobacco Use  . Smoking status: Never Smoker  . Smokeless tobacco: Never Used  Substance and Sexual Activity  . Alcohol use: No  . Drug use: No  . Sexual activity: Yes    Birth control/protection: Surgical  Lifestyle  . Physical activity    Days per week: Not on file    Minutes per session: Not on file  . Stress: Not on file  Relationships  . Social Herbalist on phone: Not on file    Gets together: Not on file    Attends religious service: Not on file    Active member of club or organization: Not on file    Attends meetings of clubs or organizations: Not on file    Relationship status: Not on file  . Intimate partner violence    Fear of current or ex partner: Not on file    Emotionally abused: Not on file    Physically abused: Not on file    Forced sexual activity: Not on file  Other Topics Concern  . Not on file  Social History Narrative  . Not on file    Medications:   Current Outpatient Medications on File Prior to Visit  Medication Sig Dispense Refill  . eletriptan (RELPAX) 40 MG tablet When you HAVE a severe migraine, take one; may take a second dose 2 hours later, but no more than 2 doses in 24 hours or 4 doses/week. 10 tablet 5  . estradiol (ESTRACE VAGINAL) 0.1 MG/GM vaginal cream Estrace 0.01% (0.1 mg/gram) vaginal cream    . Suvorexant (BELSOMRA) 10 MG TABS Take by mouth at bedtime.    . topiramate (TOPAMAX) 25 MG tablet     . Tretinoin Microsphere (RETIN-A MICRO PUMP) 0.06 % GEL Retin-A Micro Pump 0.06 % topical gel  1 APPLICATION THIN LAYER TO FACE NIGHTLY     Current Facility-Administered Medications on File Prior to Visit  Medication Dose Route Frequency Provider Last Rate Last Dose  . gadopentetate dimeglumine (MAGNEVIST) injection 20 mL  20 mL Intravenous Once  PRN Garvin Fila, MD        Allergies:   Allergies  Allergen Reactions  . No Known Allergies      Physical exam:  General: well developed, well nourished, pleasant middle-aged Caucasian female, seated, in no evident distress Head: head normocephalic and atraumatic.    Neurologic Exam Mental Status: Awake and fully alert. Oriented to place and time. Recent and remote memory intact. Attention span, concentration and fund of knowledge appropriate. Mood and affect appropriate.  Cranial Nerves: Extraocular movements full without  nystagmus. Hearing intact to voice. Facial sensation intact. Face, tongue, palate moves normally and symmetrically.  Shoulder shrug symmetric. Motor: No evidence of weakness per drift assessment Sensory.: intact to light touch Coordination: Rapid alternating movements normal in all extremities. Finger-to-nose and heel-to-shin performed accurately bilaterally. Gait and Station: Arises from chair without difficulty. Stance is normal. Gait demonstrates normal stride length and balance .  Reflexes: UTA   Imaging:  MR BRAIN WO CONTRAST 11/16/2018 IMPRESSION:  MRI brain (without) demonstrating: - Few subcortical foci of non-specific gliosis. - No acute findings. No change from MRI on 06/09/17.      ASSESSMENT: 53 year old Caucasian lady referred to the office with initial evaluation on 10/25/2018  with Dr. Leonie Jenkins with complaints of 3 to 58-month history of progressive intermittent symptoms of dizziness, imbalance, tremors, muscle twitching's and memory difficulties of unclear etiology.  She has prior history of her typical migraines and an abnormal MRI showing nonspecific white matter hyperintensities likely migraine related.  Extensive lab work-up and MRI unremarkable for underlying cause of symptoms.  Recommended decreasing Topamax dose as migraine stable.  All symptoms have resolved at this time without intervention as she did not decrease Topamax dose.      PLAN: -As migraines well-controlled recommend decreasing Topamax from 50 mg twice daily to 25mg  BID but advised to notify office if migraines worsen with decreasing dose -patient hesitant on decreasing dose and fear of worsening migraines but willing to try -Advised to continue stay active and maintain a healthy diet -She will notify office with any worsening symptoms or new symptoms  Follow-up in 6 months or call earlier if needed  Greater than 50% of this 15-minute non-face-to-face visit was spent discussing prior symptoms, migraine management and ongoing use of Topamax.  Answered all questions to patient satisfaction.  Venancio Poisson, AGNP-BC  [facility] [address] [place], [place] [address]  Phone [phone] Fax [phone] Note: This document was prepared with digital dictation and possible smart phrase technology. Any transcriptional errors that result from this process are unintentional.

## 2019-07-05 ENCOUNTER — Telehealth: Payer: Self-pay | Admitting: Adult Health

## 2019-07-05 NOTE — Telephone Encounter (Signed)
Patient stated that since her last office visit and decrease in her Topamax she has been having daily headaches that are sometimes accompanied by dizziness. She has had these headaches for a couple of months now. She stated that her last migraine was last weekend. Please advise.

## 2019-07-05 NOTE — Telephone Encounter (Signed)
Pt called stating that she is having worse headaches now that her topiramate (TOPAMAX) 25 MG tablet has been lowered. Please advise.

## 2019-07-05 NOTE — Telephone Encounter (Signed)
Attempted to decrease Topamax due to improvement of migraines but as migraines have worsened, we may need to increase dosage back. If patient is willing, we can increase to Topamax 50 mg daily

## 2019-07-10 NOTE — Telephone Encounter (Signed)
Unable to get in contact with the patient. LVM asking her is she willing to increase her Topamax to 50 mg daily. Office number was provided.   If patient calls back ask her if she is willing to increase her dose of Topamax 25 mg to 50 mg daily.

## 2019-08-28 ENCOUNTER — Ambulatory Visit: Payer: BC Managed Care – PPO | Admitting: Adult Health

## 2019-10-16 ENCOUNTER — Other Ambulatory Visit: Payer: Self-pay | Admitting: Adult Health

## 2019-11-13 ENCOUNTER — Telehealth: Payer: Self-pay | Admitting: Adult Health

## 2019-11-13 NOTE — Telephone Encounter (Signed)
Pt called and LVM stating that her pharmacy informed her that she needed to call us about her eletriptan (RELPAX) 40 MG tablet refill Please call pt back to discuss this.

## 2019-11-14 MED ORDER — ELETRIPTAN HYDROBROMIDE 40 MG PO TABS: ORAL_TABLET | ORAL | 5 refills | Status: DC

## 2019-11-14 NOTE — Telephone Encounter (Signed)
Spoke to pt and made appt for her 12-25-19 at 1115.  She will come in.  I refilled her eletriptan for her  [facility].

## 2019-11-27 ENCOUNTER — Other Ambulatory Visit: Payer: Self-pay | Admitting: Adult Health

## 2019-12-25 ENCOUNTER — Ambulatory Visit: Payer: Self-pay | Admitting: Adult Health

## 2020-01-08 ENCOUNTER — Ambulatory Visit (INDEPENDENT_AMBULATORY_CARE_PROVIDER_SITE_OTHER): Payer: BC Managed Care – PPO | Admitting: Adult Health

## 2020-01-08 ENCOUNTER — Encounter: Payer: Self-pay | Admitting: Adult Health

## 2020-01-08 ENCOUNTER — Other Ambulatory Visit: Payer: Self-pay

## 2020-01-08 VITALS — BP 126/83 | HR 69 | Ht 69.0 in | Wt 181.0 lb

## 2020-01-08 DIAGNOSIS — G43709 Chronic migraine without aura, not intractable, without status migrainosus: Secondary | ICD-10-CM

## 2020-01-08 NOTE — Progress Notes (Signed)
[facility] [address] [place]. [place] [address] [phone]       FOLLOW UP NOTE  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 Leonie Man     HPI:   Today, 01/08/2020, Jill Jenkins returns for migraine follow-up.  Initially presented to our office in 05/2017 after recurrent stereotypical episodes of tightening of extremities followed by brief loss of consciousness and headaches of unclear significance potentially atypical migraine versus complex partial seizures which responded well to Topamax 100 mg twice daily.  Longstanding history of migraines. On 11/07/2018, seen in office by Dr. Leonie Man for new complaints of dizziness, tremors, twitching and memory loss potentially medication related as all lab work and imaging unremarkable.  Recommended decreasing dosage of Topamax but at follow-up on 02/23/2019 she denies dosage decrease and spontaneous resolution of prior symptoms.  As migraine stable, it was recommended to decrease Topamax 50 mg twice daily to 25 mg twice daily.  Initially had difficulty with worsening migraine frequency but eventually subsided and has continued on Topamax 25 mg twice daily.  Typical migraine occurrence once monthly lasting for approximately 2 to 3 days and has had slight increase more recently with new onset of allergies this season.  Use of Relpax for emergent relief with benefit approximately 1 time monthly.  No concerns at this time.       History provided for reference purposes only Update 02/23/2019 JM: She is being seen today for follow-up regarding complaints of dizziness, tremors, memory twitching and mild memory impairment.  Extensive lab work-up unremarkable and repeat MRI without acute or new findings compared to prior MRI.  It was recommended at prior visit to decrease Topamax dose from 50 mg twice daily to 25 mg  twice daily as migraines have been stable.  She did not end up decreasing Topamax dose as she had a lot of 50 mg capsules still and wanted to finish those prior to decreasing dose.  All prior symptoms have subsided without intervention.  Migraines have been stable and will experience occasional migraine with weather changes.  No further concerns at this time.  11/07/2018 visit Dr. Leonie Man: Jill Jenkins is referred today for evaluation for new complaints of dizziness, tremors, twitching's and memory loss for the last 3 to 4 months.  History is obtained from the patient and review of electronic medical records.  I have personally reviewed imaging films in PACS.  She is known to [place] for prior visits for a typical migraine and was last seen on 08/24/2018 by Venancio Poisson nurse practitioner.  States her migraine headaches seem to be under good control and presently she takes Topamax 50 mg twice daily and gets headaches only once every 2 3 months or so.  Her main complaint today is that for the last 4 to 6 months she has had some progressively worsening symptoms.  She feels dizzy off balance.  She does yoga 4 days a week and at times noticed that she has to hold on to avoid falls.  She has had a few minor falls but no major injuries.  She denies true vertigo or presyncopal symptoms or loss of consciousness.  She is also noticed a mild tremor of her right hand off-and-on.  It is very fine and intermittent and does not interfere with activities of daily living.  She also feels that her body is quite rigid and  at times she is grinding her teeth.  She is also noticed occasional twitching's of the muscles involving her shoulder and the neck.  She also complains of mild short-term memory difficulties.  She denies any prior history of depression or anxiety but feels that she has a underlying fear that she may develop Parkinson's disease as her mother suffered from this disease.  She has lost about 50 pounds in the last 1 year on  a strict keto diet and she states she is eating healthy.  She denies any significant headaches, loss of consciousness, double vision, gait or balance difficulties.  She has had no recent lab work or brain imaging study done.      ROS:   14 system review of systems is positive for no complaints and all other systems negative  PMH:  Past Medical History:  Diagnosis Date  . Insomnia   . Low back pain   . Menorrhagia 03/28/2012  . Migraines   . Renal calculus     Social History:  Social History   Socioeconomic History  . Marital status: Married    Spouse name: Not on file  . Number of children: Not on file  . Years of education: Not on file  . Highest education level: Not on file  Occupational History  . Not on file  Tobacco Use  . Smoking status: Never Smoker  . Smokeless tobacco: Never Used  Substance and Sexual Activity  . Alcohol use: No  . Drug use: No  . Sexual activity: Yes    Birth control/protection: Surgical  Other Topics Concern  . Not on file  Social History Narrative  . Not on file   Social Determinants of Health   Financial Resource Strain:   . Difficulty of Paying Living Expenses:   Food Insecurity:   . Worried About Charity fundraiser in the Last Year:   . Arboriculturist in the Last Year:   Transportation Needs:   . Film/video editor (Medical):   Marland [place] Lack of Transportation (Non-Medical):   Physical Activity:   . Days of Exercise per Week:   . Minutes of Exercise per Session:   Stress:   . Feeling of Stress :   Social Connections:   . Frequency of Communication with Friends and Family:   . Frequency of Social Gatherings with Friends and Family:   . Attends Religious Services:   . Active Member of Clubs or Organizations:   . Attends Archivist Meetings:   Marland Kitchen Marital Status:   Intimate Partner Violence:   . Fear of Current or Ex-Partner:   . Emotionally Abused:   Marland [place] Physically Abused:   . Sexually Abused:     Medications:    Current Outpatient Medications on File Prior to Visit  Medication Sig Dispense Refill  . eletriptan (RELPAX) 40 MG tablet When you HAVE a severe migraine, take one; may take a second dose 2 hours later, but no more than 2 doses in 24 hours or 4 doses/week. 10 tablet 5  . estradiol (ESTRACE VAGINAL) 0.1 MG/GM vaginal cream Estrace 0.01% (0.1 mg/gram) vaginal cream    . Suvorexant (BELSOMRA) 10 MG TABS Take by mouth at bedtime.    . topiramate (TOPAMAX) 25 MG tablet TAKE 1 TABLET TWICE A DAY 180 tablet 1  . Tretinoin Microsphere (RETIN-A MICRO PUMP) 0.06 % GEL Retin-A Micro Pump 0.06 % topical gel  1 APPLICATION THIN LAYER TO FACE NIGHTLY     Current Facility-Administered  Medications on File Prior to Visit  Medication Dose Route Frequency Provider Last Rate Last Admin  . gadopentetate dimeglumine (MAGNEVIST) injection 20 mL  20 mL Intravenous Once PRN Garvin Fila, MD        Allergies:   Allergies  Allergen Reactions  . No Known Allergies    Vitals: Today's Vitals   01/08/20 1443  BP: 126/83  Pulse: 69  Weight: 181 lb (82.1 kg)  Height: 5\' 9"  (1.753 m)   Body mass index is 26.73 kg/m.   Physical exam: General: well developed, well nourished,  very pleasant middle-age Caucasian female, seated, in no evident distress Head: head normocephalic and atraumatic.   Neck: supple with no carotid or supraclavicular bruits Cardiovascular: regular rate and rhythm, no murmurs Musculoskeletal: no deformity Skin:  no rash/petichiae Vascular:  Normal pulses all extremities   Neurologic Exam Mental Status: Awake and fully alert. Oriented to place and time. Recent and remote memory intact. Attention span, concentration and fund of knowledge appropriate. Mood and affect appropriate.  Cranial Nerves: Fundoscopic exam reveals sharp disc margins. Pupils equal, briskly reactive to light. Extraocular movements full without nystagmus. Visual fields full to confrontation. Hearing intact. Facial  sensation intact. Face, tongue, palate moves normally and symmetrically.  Motor: Normal bulk and tone. Normal strength in all tested extremity muscles. Sensory.: intact to touch , pinprick , position and vibratory sensation.  Coordination: Rapid alternating movements normal in all extremities. Finger-to-nose and heel-to-shin performed accurately bilaterally. Gait and Station: Arises from chair without difficulty. Stance is normal. Gait demonstrates normal stride length and balance Reflexes: 1+ and symmetric. Toes downgoing.        ASSESSMENT: 54 year old Caucasian lady referred to the office with initial evaluation on 10/25/2018  with Dr. Leonie Man with complaints of 3 to 2-month history of progressive intermittent symptoms of dizziness, imbalance, tremors, muscle twitching's and memory difficulties of unclear etiology.  She has prior history of her typical migraines and an abnormal MRI showing nonspecific white matter hyperintensities likely migraine related.  Extensive lab work-up and MRI unremarkable for underlying cause of symptoms.  Symptoms resolved spontaneously.  Being seen today for migraine follow-up with recent worsening likely due to underlying allergies.     PLAN: -Continue Topamax 25 mg twice daily for migraine prophylaxis -refill recently provided -Continue Relpax as needed for abortive therapy -refill recently provided -Recommend use of OTC allergy medication for new onset allergies -if allergy symptoms improve but continues to experience increase headache days, advised to call office for possible need of Topamax dosage increase -Advised to continue stay active and maintain a healthy diet   Follow-up in 6 months or call earlier if needed   I spent 22 minutes of face-to-face and non-face-to-face time with patient.  This included previsit chart review, lab review, study review, order entry, electronic health record documentation, patient education   Frann Rider,  [facility]  [facility] [address] [place], [place] [address]  Phone [phone] Fax [phone] Note: This document was prepared with digital dictation and possible smart phrase technology. Any transcriptional errors that result from this process are unintentional.

## 2020-01-08 NOTE — Patient Instructions (Addendum)
Your Plan:  Continue topamax 50mg  twice daily for migraine management  Continue Relpax as needed for emergent relief   Try use of OTC allergy medication such as Xyzal, sinus medication and Flonase nasal spray     Follow up in 6 months or call earlier if needed      Thank you for coming to see [place] at [facility]. I hope we have been able to provide you high quality care today.  You may receive a patient satisfaction survey over the next few weeks. We would appreciate your feedback and comments so that we may continue to improve ourselves and the health of our patients.

## 2020-01-09 ENCOUNTER — Encounter: Payer: Self-pay | Admitting: Adult Health

## 2020-01-15 NOTE — Progress Notes (Signed)
I agree with the above plan 

## 2020-04-22 ENCOUNTER — Telehealth: Payer: Self-pay | Admitting: Adult Health

## 2020-04-22 MED ORDER — TOPIRAMATE 25 MG PO TABS: 50.0000 mg | ORAL_TABLET | Freq: Two times a day (BID) | ORAL | 1 refills | Status: DC

## 2020-04-22 NOTE — Telephone Encounter (Signed)
Called pt and relayed that per JM/NO can increase to topamax 50mg  po bid will refill next fill as she has 1/2 bottle now.  She used the relpax prn and will get refill.  Placed new dose in prescription but did not fill at this time.

## 2020-04-22 NOTE — Telephone Encounter (Signed)
I called pt she has had for the last 2-4 wks, increased headaches, dull headaches to 1/ wk migraine.  Taking topamax 25mg  po bid. Has some dizziness as well, off and on.  Went to [place] 2 wks ago (she does note altitude/barometric pressure issues).  Increase topamax or change to something else.  Please advise. Last seen 01-08-20, has appt 10/21.

## 2020-04-22 NOTE — Addendum Note (Signed)
Addended by: Brandon Melnick on: 04/22/2020 02:12 PM   Modules accepted: Orders

## 2020-04-22 NOTE — Telephone Encounter (Signed)
Recommend increasing Topamax back to 50 mg twice daily.  Can also use Relpax for emergent migraine relief

## 2020-04-22 NOTE — Telephone Encounter (Signed)
Pt is asking for a call to discuss more frequent migraines she is having

## 2020-05-25 ENCOUNTER — Other Ambulatory Visit: Payer: Self-pay | Admitting: Adult Health

## 2020-07-09 ENCOUNTER — Ambulatory Visit (INDEPENDENT_AMBULATORY_CARE_PROVIDER_SITE_OTHER): Payer: BC Managed Care – PPO | Admitting: Adult Health

## 2020-07-09 ENCOUNTER — Other Ambulatory Visit: Payer: Self-pay

## 2020-07-09 ENCOUNTER — Encounter: Payer: Self-pay | Admitting: Adult Health

## 2020-07-09 VITALS — BP 118/82 | HR 75 | Ht 69.0 in | Wt 195.0 lb

## 2020-07-09 DIAGNOSIS — G43009 Migraine without aura, not intractable, without status migrainosus: Secondary | ICD-10-CM

## 2020-07-09 DIAGNOSIS — G43709 Chronic migraine without aura, not intractable, without status migrainosus: Secondary | ICD-10-CM

## 2020-07-09 MED ORDER — NURTEC 75 MG PO TBDP: 75.0000 mg | ORAL_TABLET | Freq: Every day | ORAL | 0 refills | Status: DC | PRN

## 2020-07-09 MED ORDER — TOPIRAMATE 50 MG PO TABS: 50.0000 mg | ORAL_TABLET | Freq: Two times a day (BID) | ORAL | 3 refills | Status: DC

## 2020-07-09 NOTE — Patient Instructions (Addendum)
Your Plan:  Continue topamax 50mg  twice daily for migraine prevention  Trial use of Nurtec 75mg  daily as needed for emergent relief - if beneficial, please call office for a formal prescription     Follow up in 6 months or call earlier if needed     Thank you for coming to see [place] at [facility]. I hope we have been able to provide you high quality care today.  You may receive a patient satisfaction survey over the next few weeks. We would appreciate your feedback and comments so that we may continue to improve ourselves and the health of our patients.

## 2020-07-09 NOTE — Progress Notes (Signed)
[facility] [address] [place]. [place] [address] [phone]       FOLLOW UP NOTE  [name] Date of Birth:  11/19/1965 Medical Record Number:  [id number]   Referring MD: Cliffton Asters, PA-C  Reason for Referral: Atypical migraines [facility] provider: Dr. Leonie Man   Chief Complaint  Patient presents with  . Follow-up    rm 9, alone, pt states migraines are better,      HPI:   Today, 07/09/2020, Jill Jenkins returns for migraine follow-up. She called office on 8/9 with complaints of worsening migraine headaches and recommended increasing Topamax dosage back to 50 mg twice daily with improvement of migraine occurrence.  She remains on Topamax 50 mg twice daily without side effects and currently, migraine frequency can vary with some months only a couple migraines but other months may be up to 5 or 6.  She will use Relpax at migraine onset with occasional benefit but will typically experience an additional migraine the following day around the same time.  She has noticed triggers or change of barometric pressure, alcohol and sweet foods such as cake.  No concerns at this time.    History provided for reference purposes only Update 01/08/2020 JM: Jill Jenkins returns for migraine follow-up.  Initially presented to our office in 05/2017 after recurrent stereotypical episodes of tightening of extremities followed by brief loss of consciousness and headaches of unclear significance potentially atypical migraine versus complex partial seizures which responded well to Topamax 100 mg twice daily.  Longstanding history of migraines. On 11/07/2018, seen in office by Dr. Leonie Man for new complaints of dizziness, tremors, twitching and memory loss potentially medication related as all lab work and imaging unremarkable.  Recommended decreasing dosage of Topamax but at follow-up on 02/23/2019 she denies dosage decrease and spontaneous resolution of prior symptoms.  As migraine stable, it was  recommended to decrease Topamax 50 mg twice daily to 25 mg twice daily.  Initially had difficulty with worsening migraine frequency but eventually subsided and has continued on Topamax 25 mg twice daily.  Typical migraine occurrence once monthly lasting for approximately 2 to 3 days and has had slight increase more recently with new onset of allergies this season.  Use of Relpax for emergent relief with benefit approximately 1 time monthly.  No concerns at this time.  Update 02/23/2019 JM: She is being seen today for follow-up regarding complaints of dizziness, tremors, memory twitching and mild memory impairment.  Extensive lab work-up unremarkable and repeat MRI without acute or new findings compared to prior MRI.  It was recommended at prior visit to decrease Topamax dose from 50 mg twice daily to 25 mg twice daily as migraines have been stable.  She did not end up decreasing Topamax dose as she had a lot of 50 mg capsules still and wanted to finish those prior to decreasing dose.  All prior symptoms have subsided without intervention.  Migraines have been stable and will experience occasional migraine with weather changes.  No further concerns at this time.  11/07/2018 visit Dr. Leonie Man: Jill Jenkins is referred today for evaluation for new complaints of dizziness, tremors, twitching's and memory loss for the last 3 to 4 months.  History is obtained from the patient and review of electronic medical records.  I have personally reviewed imaging films in PACS.  She is known to [place] for prior visits for a typical migraine and was last seen on 08/24/2018 by Venancio Poisson nurse practitioner.  States her  migraine headaches seem to be under good control and presently she takes Topamax 50 mg twice daily and gets headaches only once every 2 3 months or so.  Her main complaint today is that for the last 4 to 6 months she has had some progressively worsening symptoms.  She feels dizzy off balance.  She does yoga 4 days a  week and at times noticed that she has to hold on to avoid falls.  She has had a few minor falls but no major injuries.  She denies true vertigo or presyncopal symptoms or loss of consciousness.  She is also noticed a mild tremor of her right hand off-and-on.  It is very fine and intermittent and does not interfere with activities of daily living.  She also feels that her body is quite rigid and at times she is grinding her teeth.  She is also noticed occasional twitching's of the muscles involving her shoulder and the neck.  She also complains of mild short-term memory difficulties.  She denies any prior history of depression or anxiety but feels that she has a underlying fear that she may develop Parkinson's disease as her mother suffered from this disease.  She has lost about 50 pounds in the last 1 year on a strict keto diet and she states she is eating healthy.  She denies any significant headaches, loss of consciousness, double vision, gait or balance difficulties.  She has had no recent lab work or brain imaging study done.      ROS:   14 system review of systems is positive for no complaints and all other systems negative  PMH:  Past Medical History:  Diagnosis Date  . Insomnia   . Low back pain   . Menorrhagia 03/28/2012  . Migraines   . Renal calculus     Social History:  Social History   Socioeconomic History  . Marital status: Married    Spouse name: Not on file  . Number of children: Not on file  . Years of education: Not on file  . Highest education level: Not on file  Occupational History  . Not on file  Tobacco Use  . Smoking status: Never Smoker  . Smokeless tobacco: Never Used  Substance and Sexual Activity  . Alcohol use: No  . Drug use: No  . Sexual activity: Yes    Birth control/protection: Surgical  Other Topics Concern  . Not on file  Social History Narrative  . Not on file   Social Determinants of Health   Financial Resource Strain:   . Difficulty  of Paying Living Expenses: Not on file  Food Insecurity:   . Worried About Charity fundraiser in the Last Year: Not on file  . Ran Out of Food in the Last Year: Not on file  Transportation Needs:   . Lack of Transportation (Medical): Not on file  . Lack of Transportation (Non-Medical): Not on file  Physical Activity:   . Days of Exercise per Week: Not on file  . Minutes of Exercise per Session: Not on file  Stress:   . Feeling of Stress : Not on file  Social Connections:   . Frequency of Communication with Friends and Family: Not on file  . Frequency of Social Gatherings with Friends and Family: Not on file  . Attends Religious Services: Not on file  . Active Member of Clubs or Organizations: Not on file  . Attends Archivist Meetings: Not on file  .  Marital Status: Not on file  Intimate Partner Violence:   . Fear of Current or Ex-Partner: Not on file  . Emotionally Abused: Not on file  . Physically Abused: Not on file  . Sexually Abused: Not on file    Medications:   Current Outpatient Medications on File Prior to Visit  Medication Sig Dispense Refill  . eletriptan (RELPAX) 40 MG tablet When you HAVE a severe migraine, take one; may take a second dose 2 hours later, but no more than 2 doses in 24 hours or 4 doses/week. 10 tablet 5  . estradiol (ESTRACE VAGINAL) 0.1 MG/GM vaginal cream Estrace 0.01% (0.1 mg/gram) vaginal cream    . topiramate (TOPAMAX) 25 MG tablet Take 2 tablets (50 mg total) by mouth 2 (two) times daily. 360 tablet 1  . Tretinoin Microsphere (RETIN-A MICRO PUMP) 0.06 % GEL Retin-A Micro Pump 0.06 % topical gel  1 APPLICATION THIN LAYER TO FACE NIGHTLY     Current Facility-Administered Medications on File Prior to Visit  Medication Dose Route Frequency Provider Last Rate Last Admin  . gadopentetate dimeglumine (MAGNEVIST) injection 20 mL  20 mL Intravenous Once PRN Garvin Fila, MD        Allergies:   Allergies  Allergen Reactions  . No Known  Allergies    Vitals: Today's Vitals   07/09/20 1453  BP: 118/82  Pulse: 75  Weight: 195 lb (88.5 kg)  Height: 5\' 9"  (1.753 m)   Body mass index is 28.8 kg/m.   Physical exam: General: well developed, well nourished, very pleasant middle-age Caucasian female, seated, in no evident distress Head: head normocephalic and atraumatic.   Neck: supple with no carotid or supraclavicular bruits Cardiovascular: regular rate and rhythm, no murmurs Musculoskeletal: no deformity Skin:  no rash/petichiae Vascular:  Normal pulses all extremities   Neurologic Exam Mental Status: Awake and fully alert.   Fluent speech and language.  Oriented to place and time. Recent and remote memory intact. Attention span, concentration and fund of knowledge appropriate. Mood and affect appropriate.  Cranial Nerves: Pupils equal, briskly reactive to light. Extraocular movements full without nystagmus. Visual fields full to confrontation. Hearing intact. Facial sensation intact. Face, tongue, palate moves normally and symmetrically.  Motor: Normal bulk and tone. Normal strength in all tested extremity muscles. Sensory.: intact to touch , pinprick , position and vibratory sensation.  Coordination: Rapid alternating movements normal in all extremities. Finger-to-nose and heel-to-shin performed accurately bilaterally. Gait and Station: Arises from chair without difficulty. Stance is normal. Gait demonstrates normal stride length and balance Reflexes: 1+ and symmetric. Toes downgoing.        ASSESSMENT/PLAN:  Jill Jenkins is a 54 year old Caucasian lady referred to the office with initial evaluation on 05/26/2017 with Dr. 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  Longstanding history of migraine headaches since age of 44.  Abnormal MRI  showing nonspecific white matter hyperintensities likely migraine related.     -Monthly migraine frequency very with some months only experiencing 2-3 migraines and other months may experience 5-6 -Continue Topamax 50 mg twice daily for migraine prophylaxis -previously attempted to lower dosage but she experienced worsening migraine headaches which improved after increasing topiramate dosage.  Topiramate refill provided -Limited benefit with use of Relpax.  Previously tried Imitrex but unable to tolerate -Recommend trialing sample pack Nurtec 75 mg once daily as needed  for acute migraine management.  Advised max dose 75 mg/24hr. She was advised to call office if beneficial and a prescription will be placed -Discussed avoiding migraine triggers   Follow-up in 6 months or call earlier if needed   I spent 25 minutes of face-to-face and non-face-to-face time with patient.  This included previsit chart review, lab review, study review, order entry, electronic health record documentation, patient education regarding atypical migraines with longstanding history of migraines, ongoing use of medications, known migraine triggers and avoidance and answered all of the questions to patient satisfaction   Frann Rider, AGNP-BC  [facility] [address] [place], [place] [address]  Phone [phone] Fax [phone] Note: This document was prepared with digital dictation and possible smart phrase technology. Any transcriptional errors that result from this process are unintentional.

## 2020-07-10 NOTE — Progress Notes (Signed)
I agree with the above plan 

## 2021-01-07 ENCOUNTER — Ambulatory Visit: Payer: BC Managed Care – PPO | Admitting: Adult Health

## 2021-01-09 ENCOUNTER — Other Ambulatory Visit: Payer: Self-pay

## 2021-01-09 ENCOUNTER — Ambulatory Visit (INDEPENDENT_AMBULATORY_CARE_PROVIDER_SITE_OTHER): Payer: BC Managed Care – PPO | Admitting: Adult Health

## 2021-01-09 ENCOUNTER — Encounter: Payer: Self-pay | Admitting: Adult Health

## 2021-01-09 VITALS — BP 127/88 | HR 68 | Ht 70.0 in | Wt 205.0 lb

## 2021-01-09 DIAGNOSIS — G43009 Migraine without aura, not intractable, without status migrainosus: Secondary | ICD-10-CM | POA: Diagnosis not present

## 2021-01-09 MED ORDER — TOPIRAMATE 50 MG PO TABS: 50.0000 mg | ORAL_TABLET | Freq: Two times a day (BID) | ORAL | 3 refills | Status: DC

## 2021-01-09 MED ORDER — ELETRIPTAN HYDROBROMIDE 40 MG PO TABS: 40.0000 mg | ORAL_TABLET | ORAL | 5 refills | Status: DC | PRN

## 2021-01-09 NOTE — Progress Notes (Signed)
I agree with the above plan 

## 2021-01-09 NOTE — Patient Instructions (Addendum)
Continue Topamax 50 mg twice daily as well as Relpax as needed    Follow-up in 6 months or call earlier if needed

## 2021-01-09 NOTE — Progress Notes (Signed)
[facility] [address] [place]. [place] [address] [phone]       FOLLOW UP NOTE  [name] Date of Birth:  1966/01/22 Medical Record Number:  [id number]   Referring MD: Cliffton Asters, PA-C  Reason for Referral: Atypical migraines [facility] provider: Dr. Leonie Man   Chief Complaint  Patient presents with  . Follow-up    RM 14 alone Pt is well, has about 2-3 migraines a month. Overall stable      HPI:   Today, 01/09/2021, Jill Jenkins returns for 58-month migraine follow-up  Reports about 2-3 migraine days per month but generally varies as her migraines are usually triggered by change in bariatric pressure or climate changes such as if Jill Jenkins travels to [place] or [place] to see her family.  If Jill Jenkins stays in this area without any traveling, Jill Jenkins may experience 1 migraine per month Remains on topiramate 50 mg twice daily tolerating without side effects Jill Jenkins remains on Relpax with continued benefit  Jill Jenkins does report new onset allergies this season which Jill Jenkins has not previously experienced.  Jill Jenkins is also currently following with her OB/GYN regarding occasional menopausal symptoms and questions possible treatment options that would not interact with topiramate.  No further concerns at this time    History provided for reference purposes only Update 07/09/2020 JM: Jill Jenkins returns for migraine follow-up. Jill Jenkins called office on 8/9 with complaints of worsening migraine headaches and recommended increasing Topamax dosage back to 50 mg twice daily with improvement of migraine occurrence.  Jill Jenkins remains on Topamax 50 mg twice daily without side effects and currently, migraine frequency can vary with some months only a couple migraines but other months may be up to 5 or 6.  Jill Jenkins will use Relpax at migraine onset with occasional benefit but will typically experience an additional migraine the following day around the same time.  Jill Jenkins has noticed triggers or change of barometric pressure,  alcohol and sweet foods such as cake.  No concerns at this time.  Update 01/08/2020 JM: Jill Jenkins returns for migraine follow-up.  Initially presented to our office in 05/2017 after recurrent stereotypical episodes of tightening of extremities followed by brief loss of consciousness and headaches of unclear significance potentially atypical migraine versus complex partial seizures which responded well to Topamax 100 mg twice daily.  Longstanding history of migraines. On 11/07/2018, seen in office by Dr. Leonie Man for new complaints of dizziness, tremors, twitching and memory loss potentially medication related as all lab work and imaging unremarkable.  Recommended decreasing dosage of Topamax but at follow-up on 02/23/2019 Jill Jenkins denies dosage decrease and spontaneous resolution of prior symptoms.  As migraine stable, it was recommended to decrease Topamax 50 mg twice daily to 25 mg twice daily.  Initially had difficulty with worsening migraine frequency but eventually subsided and has continued on Topamax 25 mg twice daily.  Typical migraine occurrence once monthly lasting for approximately 2 to 3 days and has had slight increase more recently with new onset of allergies this season.  Use of Relpax for emergent relief with benefit approximately 1 time monthly.  No concerns at this time.  Update 02/23/2019 JM: Jill Jenkins is being seen today for follow-up regarding complaints of dizziness, tremors, memory twitching and mild memory impairment.  Extensive lab work-up unremarkable and repeat MRI without acute or new findings compared to prior MRI.  It was recommended at prior visit to decrease Topamax dose from 50 mg twice daily to 25 mg twice daily as migraines have  been stable.  Jill Jenkins did not end up decreasing Topamax dose as Jill Jenkins had a lot of 50 mg capsules still and wanted to finish those prior to decreasing dose.  All prior symptoms have subsided without intervention.  Migraines have been stable and will experience occasional  migraine with weather changes.  No further concerns at this time.  11/07/2018 visit Dr. Leonie Man: Jill Jenkins is referred today for evaluation for new complaints of dizziness, tremors, twitching's and memory loss for the last 3 to 4 months.  History is obtained from the patient and review of electronic medical records.  I have personally reviewed imaging films in PACS.  Jill Jenkins is known to [place] for prior visits for a typical migraine and was last seen on 08/24/2018 by Venancio Poisson nurse practitioner.  States her migraine headaches seem to be under good control and presently Jill Jenkins takes Topamax 50 mg twice daily and gets headaches only once every 2 3 months or so.  Her main complaint today is that for the last 4 to 6 months Jill Jenkins has had some progressively worsening symptoms.  Jill Jenkins feels dizzy off balance.  Jill Jenkins does yoga 4 days a week and at times noticed that Jill Jenkins has to hold on to avoid falls.  Jill Jenkins has had a few minor falls but no major injuries.  Jill Jenkins denies true vertigo or presyncopal symptoms or loss of consciousness.  Jill Jenkins is also noticed a mild tremor of her right hand off-and-on.  It is very fine and intermittent and does not interfere with activities of daily living.  Jill Jenkins also feels that her body is quite rigid and at times Jill Jenkins is grinding her teeth.  Jill Jenkins is also noticed occasional twitching's of the muscles involving her shoulder and the neck.  Jill Jenkins also complains of mild short-term memory difficulties.  Jill Jenkins denies any prior history of depression or anxiety but feels that Jill Jenkins has a underlying fear that Jill Jenkins may develop Parkinson's disease as her mother suffered from this disease.  Jill Jenkins has lost about 50 pounds in the last 1 year on a strict keto diet and Jill Jenkins states Jill Jenkins is eating healthy.  Jill Jenkins denies any significant headaches, loss of consciousness, double vision, gait or balance difficulties.  Jill Jenkins has had no recent lab work or brain imaging study done.      ROS:   14 system review of systems is positive for  allergies and all other systems negative  PMH:  Past Medical History:  Diagnosis Date  . Insomnia   . Low back pain   . Menorrhagia 03/28/2012  . Migraines   . Renal calculus     Social History:  Social History   Socioeconomic History  . Marital status: Married    Spouse name: Not on file  . Number of children: Not on file  . Years of education: Not on file  . Highest education level: Not on file  Occupational History  . Not on file  Tobacco Use  . Smoking status: Never Smoker  . Smokeless tobacco: Never Used  Substance and Sexual Activity  . Alcohol use: No  . Drug use: No  . Sexual activity: Yes    Birth control/protection: Surgical  Other Topics Concern  . Not on file  Social History Narrative  . Not on file   Social Determinants of Health   Financial Resource Strain: Not on file  Food Insecurity: Not on file  Transportation Needs: Not on file  Physical Activity: Not on file  Stress: Not on file  Social Connections: Not on file  Intimate Partner Violence: Not on file    Medications:   Current Outpatient Medications on File Prior to Visit  Medication Sig Dispense Refill  . eletriptan (RELPAX) 40 MG tablet Take by mouth.    . estradiol (ESTRACE) 0.1 MG/GM vaginal cream Estrace 0.01% (0.1 mg/gram) vaginal cream    . topiramate (TOPAMAX) 50 MG tablet Take 1 tablet (50 mg total) by mouth 2 (two) times daily. 180 tablet 3  . Tretinoin Microsphere 0.06 % GEL Retin-A Micro Pump 0.06 % topical gel  1 APPLICATION THIN LAYER TO FACE NIGHTLY     Current Facility-Administered Medications on File Prior to Visit  Medication Dose Route Frequency Provider Last Rate Last Admin  . gadopentetate dimeglumine (MAGNEVIST) injection 20 mL  20 mL Intravenous Once PRN Garvin Fila, MD        Allergies:   Allergies  Allergen Reactions  . No Known Allergies    Vitals: Today's Vitals   01/09/21 1239  BP: 127/88  Pulse: 68  Weight: 205 lb (93 kg)  Height: 5\' 10"  (1.778  m)   Body mass index is 29.41 kg/m.   Physical exam: General: well developed, well nourished, very pleasant middle-age Caucasian female, seated, in no evident distress Head: head normocephalic and atraumatic.   Neck: supple with no carotid or supraclavicular bruits Cardiovascular: regular rate and rhythm, no murmurs Musculoskeletal: no deformity Skin:  no rash/petichiae Vascular:  Normal pulses all extremities   Neurologic Exam Mental Status: Awake and fully alert.   Fluent speech and language.  Oriented to place and time. Recent and remote memory intact. Attention span, concentration and fund of knowledge appropriate. Mood and affect appropriate.  Cranial Nerves: Pupils equal, briskly reactive to light. Extraocular movements full without nystagmus. Visual fields full to confrontation. Hearing intact. Facial sensation intact. Face, tongue, palate moves normally and symmetrically.  Motor: Normal bulk and tone. Normal strength in all tested extremity muscles. Sensory.: intact to touch , pinprick , position and vibratory sensation.  Coordination: Rapid alternating movements normal in all extremities. Finger-to-nose and heel-to-shin performed accurately bilaterally. Gait and Station: Arises from chair without difficulty. Stance is normal. Gait demonstrates normal stride length and balance without use of assistive device Reflexes: 1+ and symmetric. Toes downgoing.        ASSESSMENT/PLAN:  Jill Jenkins is a 55 year old Caucasian lady referred to the office with initial evaluation on 05/26/2017 with Dr. 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  Longstanding history of migraine headaches since age of 26.  Abnormal MRI showing nonspecific white matter hyperintensities likely migraine related.     -Monthly migraine  frequency very depending on bariatric pressure or climate change but overall relatively stable occurring 1-3 days monthly -Continue Topamax 50 mg twice daily for migraine prophylaxis -refill provided -Continue Relpax for emergent relief -refill provided -Discussed use of nonhormonal menopausal treatment such as SNRIs or gabapentin.  Would recommend avoiding estrogen therapy due to known migraines and family history of stroke.  Jill Jenkins plans on further discussing with her OB/GYN   Follow-up in 6 months or call earlier if needed   CC:  [facility] provider: Dr. Lloyd Huger, Rebeca Alert, MD     Frann Rider, [facility]  [facility] [address] [place], [place] [address]  Phone [phone] Fax [phone] Note: This document was prepared with digital  dictation and possible smart phrase technology. Any transcriptional errors that result from this process are unintentional.

## 2021-02-15 ENCOUNTER — Other Ambulatory Visit: Payer: Self-pay

## 2021-02-15 ENCOUNTER — Encounter (HOSPITAL_COMMUNITY): Payer: Self-pay

## 2021-02-15 ENCOUNTER — Emergency Department (HOSPITAL_COMMUNITY): Payer: BC Managed Care – PPO

## 2021-02-15 ENCOUNTER — Inpatient Hospital Stay (HOSPITAL_COMMUNITY)
Admission: EM | Admit: 2021-02-15 | Discharge: 2021-02-21 | DRG: 271 | Disposition: A | Discharge status: Home / Self Care | Payer: BC Managed Care – PPO | Attending: Family Medicine | Admitting: Family Medicine

## 2021-02-15 DIAGNOSIS — Z9889 Other specified postprocedural states: Secondary | ICD-10-CM

## 2021-02-15 DIAGNOSIS — I3139 Other pericardial effusion (noninflammatory): Secondary | ICD-10-CM

## 2021-02-15 DIAGNOSIS — Z20822 Contact with and (suspected) exposure to covid-19: Secondary | ICD-10-CM | POA: Diagnosis present

## 2021-02-15 DIAGNOSIS — N179 Acute kidney failure, unspecified: Secondary | ICD-10-CM | POA: Diagnosis present

## 2021-02-15 DIAGNOSIS — B349 Viral infection, unspecified: Secondary | ICD-10-CM | POA: Diagnosis present

## 2021-02-15 DIAGNOSIS — I313 Pericardial effusion (noninflammatory): Secondary | ICD-10-CM

## 2021-02-15 DIAGNOSIS — Z79899 Other long term (current) drug therapy: Secondary | ICD-10-CM

## 2021-02-15 DIAGNOSIS — J9811 Atelectasis: Secondary | ICD-10-CM | POA: Diagnosis present

## 2021-02-15 DIAGNOSIS — Z8249 Family history of ischemic heart disease and other diseases of the circulatory system: Secondary | ICD-10-CM

## 2021-02-15 DIAGNOSIS — Z823 Family history of stroke: Secondary | ICD-10-CM

## 2021-02-15 DIAGNOSIS — I300 Acute nonspecific idiopathic pericarditis: Secondary | ICD-10-CM | POA: Diagnosis not present

## 2021-02-15 DIAGNOSIS — G43909 Migraine, unspecified, not intractable, without status migrainosus: Secondary | ICD-10-CM | POA: Diagnosis present

## 2021-02-15 DIAGNOSIS — R079 Chest pain, unspecified: Secondary | ICD-10-CM

## 2021-02-15 DIAGNOSIS — Q248 Other specified congenital malformations of heart: Secondary | ICD-10-CM

## 2021-02-15 DIAGNOSIS — I712 Thoracic aortic aneurysm, without rupture: Secondary | ICD-10-CM | POA: Diagnosis present

## 2021-02-15 DIAGNOSIS — K7689 Other specified diseases of liver: Secondary | ICD-10-CM

## 2021-02-15 DIAGNOSIS — I319 Disease of pericardium, unspecified: Secondary | ICD-10-CM | POA: Diagnosis present

## 2021-02-15 DIAGNOSIS — I318 Other specified diseases of pericardium: Secondary | ICD-10-CM | POA: Diagnosis present

## 2021-02-15 DIAGNOSIS — R55 Syncope and collapse: Secondary | ICD-10-CM | POA: Diagnosis not present

## 2021-02-15 DIAGNOSIS — G4700 Insomnia, unspecified: Secondary | ICD-10-CM | POA: Diagnosis present

## 2021-02-15 DIAGNOSIS — I471 Supraventricular tachycardia: Secondary | ICD-10-CM | POA: Diagnosis present

## 2021-02-15 DIAGNOSIS — E860 Dehydration: Secondary | ICD-10-CM | POA: Diagnosis present

## 2021-02-15 LAB — CBC
HCT: 46.3 % — ABNORMAL HIGH (ref 36.0–46.0)
Hemoglobin: 15 g/dL (ref 12.0–15.0)
MCH: 31.5 pg (ref 26.0–34.0)
MCHC: 32.4 g/dL (ref 30.0–36.0)
MCV: 97.3 fL (ref 80.0–100.0)
Platelets: 204 10*3/uL (ref 150–400)
RBC: 4.76 MIL/uL (ref 3.87–5.11)
RDW: 12.8 % (ref 11.5–15.5)
WBC: 11.5 10*3/uL — ABNORMAL HIGH (ref 4.0–10.5)
nRBC: 0 % (ref 0.0–0.2)

## 2021-02-15 LAB — BASIC METABOLIC PANEL
Anion gap: 11 (ref 5–15)
BUN: 15 mg/dL (ref 6–20)
CO2: 19 mmol/L — ABNORMAL LOW (ref 22–32)
Calcium: 9.7 mg/dL (ref 8.9–10.3)
Chloride: 110 mmol/L (ref 98–111)
Creatinine, Ser: 1.04 mg/dL — ABNORMAL HIGH (ref 0.44–1.00)
GFR, Estimated: >60 mL/min (ref >60)
Glucose, Bld: 100 mg/dL — ABNORMAL HIGH (ref 70–99)
Potassium: 3.9 mmol/L (ref 3.5–5.1)
Sodium: 140 mmol/L (ref 135–145)

## 2021-02-15 LAB — TROPONIN I (HIGH SENSITIVITY)
Troponin I (High Sensitivity): 3 ng/L (ref <18)
Troponin I (High Sensitivity): 4 ng/L (ref <18)

## 2021-02-15 LAB — HEPATIC FUNCTION PANEL
ALT: 38 U/L (ref 0–44)
AST: 24 U/L (ref 15–41)
Albumin: 3.9 g/dL (ref 3.5–5.0)
Alkaline Phosphatase: 39 U/L (ref 38–126)
Bilirubin, Direct: 0.1 mg/dL (ref 0.0–0.2)
Indirect Bilirubin: 0.6 mg/dL (ref 0.3–0.9)
Total Bilirubin: 0.7 mg/dL (ref 0.3–1.2)
Total Protein: 6.8 g/dL (ref 6.5–8.1)

## 2021-02-15 LAB — D-DIMER, QUANTITATIVE: D-Dimer, Quant: 0.62 ug/mL-FEU — ABNORMAL HIGH (ref 0.00–0.50)

## 2021-02-15 LAB — RESP PANEL BY RT-PCR (FLU A&B, COVID) ARPGX2
Influenza A by PCR: NEGATIVE
Influenza B by PCR: NEGATIVE
SARS Coronavirus 2 by RT PCR: NEGATIVE

## 2021-02-15 LAB — LIPASE, BLOOD: Lipase: 31 U/L (ref 11–51)

## 2021-02-15 MED ORDER — MORPHINE SULFATE (PF) 4 MG/ML IV SOLN
4.0000 mg | Freq: Once | INTRAVENOUS | Status: AC
  Administered 2021-02-15: 4 mg via INTRAVENOUS
  Filled 2021-02-15: qty 1

## 2021-02-15 MED ORDER — SODIUM CHLORIDE 0.9% FLUSH
3.0000 mL | Freq: Two times a day (BID) | INTRAVENOUS | Status: DC
  Administered 2021-02-16 – 2021-02-19 (×8): 3 mL via INTRAVENOUS

## 2021-02-15 MED ORDER — ENOXAPARIN SODIUM 40 MG/0.4ML IJ SOSY
40.0000 mg | PREFILLED_SYRINGE | INTRAMUSCULAR | Status: DC
  Administered 2021-02-16 – 2021-02-19 (×4): 40 mg via SUBCUTANEOUS
  Filled 2021-02-15 (×6): qty 0.4

## 2021-02-15 MED ORDER — IOHEXOL 350 MG/ML SOLN
75.0000 mL | Freq: Once | INTRAVENOUS | Status: AC | PRN
  Administered 2021-02-15: 56 mL via INTRAVENOUS

## 2021-02-15 MED ORDER — ONDANSETRON HCL 4 MG/2ML IJ SOLN
4.0000 mg | Freq: Once | INTRAMUSCULAR | Status: AC
  Administered 2021-02-15: 4 mg via INTRAVENOUS
  Filled 2021-02-15: qty 2

## 2021-02-15 NOTE — ED Provider Notes (Signed)
[facility] EMERGENCY DEPARTMENT Provider Note   CSN: [id number] Arrival date & time: 02/15/21  1439     History No chief complaint on file.   Jill Jenkins is a 54 y.o. female with a past medical history of migraines who presents today for evaluation of sudden onset chest pain. She states that this afternoon she was doing laundry and had sudden onset of significant chest pain.  She states that she felt short of breath with this.  She did not note any aggravating or alleviating factors, position changes did not change her symptoms. She had laid on the couch and while her husband was on the phone with 911 had a witnessed syncopal event.  She did not fall or suffer any injury. She was reportedly fully unconscious for about 3 minutes and then was "in and out" for another 5 minutes approximately. Patient states that her pain is slightly better than it was earlier however she still has pain in her chest. She denies any personal history of cardiac events.  She denies any history of DVT/PE, no recent double, surgeries, or immobilizations. She reports that she does have extensive family history of cardiac events with a cousin in their 54s requiring open heart surgery and a history of congenital heart issues by her report. She states that she did have 1 echo about 20 years ago and does not recall being told she had any abnormalities.  She had 4 baby aspirin today prior to arrival.  HPI     Past Medical History:  Diagnosis Date  . Insomnia   . Low back pain   . Menorrhagia 03/28/2012  . Migraines   . Renal calculus     Patient Active Problem List   Diagnosis Date Noted  . Menorrhagia 03/28/2012    Past Surgical History:  Procedure Laterality Date  . DILATION AND CURETTAGE OF UTERUS    . POLYPECTOMY  03/29/2012   Procedure: POLYPECTOMY;  Surgeon: Thornell Sartorius, MD;  Location: [facility];  Service: Gynecology;  Laterality: N/A;     OB History   No obstetric history on  file.     Family History  Problem Relation Age of Onset  . Hypertension Mother   . Hypertension Father   . Stroke Maternal Grandfather     Social History   Tobacco Use  . Smoking status: Never Smoker  . Smokeless tobacco: Never Used  Substance Use Topics  . Alcohol use: No  . Drug use: No    Home Medications Prior to Admission medications   Medication Sig Start Date End Date Taking? Authorizing Provider  eletriptan (RELPAX) 40 MG tablet Take 1 tablet (40 mg total) by mouth as needed for migraine or headache (For acute migraine treatment; may repeat x1 PRN >2 hrs; max dose 80mg /24hr). 01/09/21   Frann Rider, NP  estradiol (ESTRACE) 0.1 MG/GM vaginal cream Estrace 0.01% (0.1 mg/gram) vaginal cream    [provider]  topiramate (TOPAMAX) 50 MG tablet Take 1 tablet (50 mg total) by mouth 2 (two) times daily. 01/09/21   Frann Rider, NP  Tretinoin Microsphere 0.06 % GEL Retin-A Micro Pump 0.06 % topical gel  1 APPLICATION THIN LAYER TO FACE NIGHTLY    [provider]    Allergies    No known allergies  Review of Systems   Review of Systems  Constitutional: Negative for chills and fever.  Respiratory: Positive for shortness of breath. Negative for chest tightness.   Cardiovascular: Positive for chest pain.  Negative for palpitations and leg swelling.  Gastrointestinal: Positive for nausea. Negative for abdominal pain, diarrhea and vomiting.  Musculoskeletal: Negative for arthralgias and neck pain.  Neurological: Positive for syncope.  Psychiatric/Behavioral: Negative for confusion.  All other systems reviewed and are negative.   Physical Exam Updated Vital Signs BP 129/86   Pulse 71   Temp 98.8 F (37.1 C) (Oral)   Resp 18   SpO2 100%   Physical Exam Vitals and nursing note reviewed.  Constitutional:      General: She is not in acute distress.    Appearance: She is not diaphoretic.  HENT:     Head: Normocephalic and atraumatic.  Eyes:      General: No scleral icterus.       Right eye: No discharge.        Left eye: No discharge.     Conjunctiva/sclera: Conjunctivae normal.  Cardiovascular:     Rate and Rhythm: Normal rate and regular rhythm.     Heart sounds: Normal heart sounds. No murmur heard.   Pulmonary:     Effort: Pulmonary effort is normal. No respiratory distress.     Breath sounds: No stridor.  Chest:     Comments: There is significant tenderness over anterior chest wall however patient states that her pain is different.  No crepitus or deformities palpated. Abdominal:     General: There is no distension.  Musculoskeletal:        General: No deformity.     Cervical back: Normal range of motion and neck supple.     Right lower leg: No edema.     Left lower leg: No edema.  Skin:    General: Skin is warm and dry.  Neurological:     Mental Status: She is alert.     Motor: No abnormal muscle tone.     Comments: Patient is awake and alert.  Answers questions appropriately.  Speech is not slurred.  Facial movements are grossly symmetric.  5/5 strength bilateral arms and legs.  Psychiatric:        Mood and Affect: Mood normal.        Behavior: Behavior normal.     ED Results / Procedures / Treatments   Labs (all labs ordered are listed, but only abnormal results are displayed) Labs Reviewed  BASIC METABOLIC PANEL - Abnormal; Notable for the following components:      Result Value   CO2 19 (*)    Glucose, Bld 100 (*)    Creatinine, Ser 1.04 (*)    All other components within normal limits  CBC - Abnormal; Notable for the following components:   WBC 11.5 (*)    HCT 46.3 (*)    All other components within normal limits  D-DIMER, QUANTITATIVE - Abnormal; Notable for the following components:   D-Dimer, Quant 0.62 (*)    All other components within normal limits  RESP PANEL BY RT-PCR (FLU A&B, COVID) ARPGX2  HEPATIC FUNCTION PANEL  LIPASE, BLOOD  TROPONIN I (HIGH SENSITIVITY)  TROPONIN I (HIGH  SENSITIVITY)    EKG EKG Interpretation  Date/Time:  Saturday February 15 2021 14:45:24 EDT Ventricular Rate:  82 PR Interval:  124 QRS Duration: 82 QT Interval:  358 QTC Calculation: 418 R Axis:   44 Text Interpretation: Sinus rhythm with marked sinus arrhythmia Cannot rule out Anterior infarct , age undetermined Abnormal ECG Confirmed by Quintella Reichert [phone]) on 02/15/2021 6:29:02 PM   Radiology DG Chest 2 View  Result Date: 02/15/2021  CLINICAL DATA:  Chest pain and shortness of breath. EXAM: CHEST - 2 VIEW COMPARISON:  None. FINDINGS: The heart size and mediastinal contours are within normal limits. Both lungs are clear. The visualized skeletal structures are unremarkable. IMPRESSION: No active cardiopulmonary disease. Electronically Signed   By: Virgina Norfolk M.D.   On: 02/15/2021 15:17    Procedures Procedures   Medications Ordered in ED Medications  morphine 4 MG/ML injection 4 mg (4 mg Intravenous Given 02/15/21 2120)  ondansetron (ZOFRAN) injection 4 mg (4 mg Intravenous Given 02/15/21 2125)  iohexol (OMNIPAQUE) 350 MG/ML injection 75 mL (56 mLs Intravenous Contrast Given 02/15/21 2325)    ED Course  I have reviewed the triage vital signs and the nursing notes.  Pertinent labs & imaging results that were available during my care of the patient were reviewed by me and considered in my medical decision making (see chart for details).  Clinical Course as of 02/15/21 2353  Sat Feb 15, 2021  2110 D-Dimer, Quant(!): 0.62 [EH]  2116 Patient reevaluated.  She will require PE study given her elevated D-dimer.  She requested pain medicine, morphine and Zofran as ordered, her QTC is not prolonged significantly. [EH]  2351 I spoke with hospitalist Dr. Nevada Crane who will see patient for admission. [EH]    Clinical Course User Index [EH] Ollen Gross   MDM Rules/Calculators/A&P                         Patient is a 55 year old woman who presents today for evaluation of  sudden onset of chest pain, shortly after which was followed by a syncopal event where she was fully unconscious for about 3 minutes with full loss of posture and tone. This happened while she had already been laid on the couch and she did not fall. Given the sudden nature of her chest pain onset combined with syncope PE is considered. D-dimer is elevated and cannot be age-adjusted.  CTA PE study is ordered. PE study pending at the time of admission. CBC shows mild leukocytosis with a white count of 11.5.  BMP is unremarkable.  Hepatic function panel is normal and lipase is not elevated.  COVID and flu tests are negative.  Troponins x2 is not elevated.  Given that patient had shortness of breath, had the sudden onset of chest pain prior to her symptoms beginning, and has not had any recent cardiac evaluation she will require admission for high risk syncope.  I spoke with Dr. Nevada Crane who will see the patient for admission.    Note: Portions of this report may have been transcribed using voice recognition software. Every effort was made to ensure accuracy; however, inadvertent computerized transcription errors may be present  Final Clinical Impression(s) / ED Diagnoses Final diagnoses:  Syncope and collapse  Chest pain, unspecified type    Rx / DC Orders ED Discharge Orders    None       Ollen Gross 02/15/21 2353    Quintella Reichert, MD 02/16/21 1241

## 2021-02-15 NOTE — ED Notes (Signed)
Pt ambulated to bathroom steadily, but endorsed feeling lightheaded due to migraine.

## 2021-02-15 NOTE — ED Triage Notes (Signed)
Patient arrived by [facility] for sharp cp. Patient stated that the pain started after putting sheets on the bed. Patient states no relief with GERD meds. Patient took aspirin prior to arrival. Patient alert and oriented

## 2021-02-16 ENCOUNTER — Observation Stay (HOSPITAL_BASED_OUTPATIENT_CLINIC_OR_DEPARTMENT_OTHER): Payer: BC Managed Care – PPO

## 2021-02-16 ENCOUNTER — Observation Stay (HOSPITAL_COMMUNITY): Payer: BC Managed Care – PPO

## 2021-02-16 ENCOUNTER — Encounter (HOSPITAL_COMMUNITY): Payer: Self-pay | Admitting: Internal Medicine

## 2021-02-16 DIAGNOSIS — I361 Nonrheumatic tricuspid (valve) insufficiency: Secondary | ICD-10-CM | POA: Diagnosis not present

## 2021-02-16 DIAGNOSIS — C801 Malignant (primary) neoplasm, unspecified: Secondary | ICD-10-CM

## 2021-02-16 DIAGNOSIS — R55 Syncope and collapse: Secondary | ICD-10-CM

## 2021-02-16 DIAGNOSIS — I313 Pericardial effusion (noninflammatory): Secondary | ICD-10-CM

## 2021-02-16 DIAGNOSIS — R079 Chest pain, unspecified: Secondary | ICD-10-CM

## 2021-02-16 DIAGNOSIS — I3139 Other pericardial effusion (noninflammatory): Secondary | ICD-10-CM

## 2021-02-16 DIAGNOSIS — R7989 Other specified abnormal findings of blood chemistry: Secondary | ICD-10-CM

## 2021-02-16 DIAGNOSIS — I300 Acute nonspecific idiopathic pericarditis: Secondary | ICD-10-CM | POA: Diagnosis not present

## 2021-02-16 DIAGNOSIS — Q248 Other specified congenital malformations of heart: Secondary | ICD-10-CM

## 2021-02-16 LAB — CBC
HCT: 41.7 % (ref 36.0–46.0)
HCT: 41.5 % (ref 36.0–46.0)
Hemoglobin: 13.7 g/dL (ref 12.0–15.0)
Hemoglobin: 14 g/dL (ref 12.0–15.0)
MCH: 31.8 pg (ref 26.0–34.0)
MCH: 32.2 pg (ref 26.0–34.0)
MCHC: 32.9 g/dL (ref 30.0–36.0)
MCHC: 33.7 g/dL (ref 30.0–36.0)
MCV: 96.8 fL (ref 80.0–100.0)
MCV: 95.4 fL (ref 80.0–100.0)
Platelets: 186 10*3/uL (ref 150–400)
Platelets: 170 10*3/uL (ref 150–400)
RBC: 4.31 MIL/uL (ref 3.87–5.11)
RBC: 4.35 MIL/uL (ref 3.87–5.11)
RDW: 13 % (ref 11.5–15.5)
RDW: 13 % (ref 11.5–15.5)
WBC: 13.1 10*3/uL — ABNORMAL HIGH (ref 4.0–10.5)
WBC: 10.5 10*3/uL (ref 4.0–10.5)
nRBC: 0 % (ref 0.0–0.2)
nRBC: 0 % (ref 0.0–0.2)

## 2021-02-16 LAB — URINALYSIS, ROUTINE W REFLEX MICROSCOPIC
Bacteria, UA: NONE SEEN
Bilirubin Urine: NEGATIVE
Glucose, UA: NEGATIVE mg/dL
Ketones, ur: NEGATIVE mg/dL
Leukocytes,Ua: NEGATIVE
Nitrite: NEGATIVE
Protein, ur: NEGATIVE mg/dL
Specific Gravity, Urine: 1.01 (ref 1.005–1.030)
pH: 5 (ref 5.0–8.0)

## 2021-02-16 LAB — LIPID PANEL
Cholesterol: 192 mg/dL (ref 0–200)
HDL: 48 mg/dL (ref >40)
LDL Cholesterol: 117 mg/dL — ABNORMAL HIGH (ref 0–99)
Total CHOL/HDL Ratio: 4 RATIO
Triglycerides: 133 mg/dL (ref <150)
VLDL: 27 mg/dL (ref 0–40)

## 2021-02-16 LAB — HIV ANTIBODY (ROUTINE TESTING W REFLEX): HIV Screen 4th Generation wRfx: NONREACTIVE

## 2021-02-16 LAB — TSH: TSH: 1.907 u[IU]/mL (ref 0.350–4.500)

## 2021-02-16 LAB — MAGNESIUM: Magnesium: 1.8 mg/dL (ref 1.7–2.4)

## 2021-02-16 LAB — BASIC METABOLIC PANEL
Anion gap: 9 (ref 5–15)
BUN: 12 mg/dL (ref 6–20)
CO2: 20 mmol/L — ABNORMAL LOW (ref 22–32)
Calcium: 8.9 mg/dL (ref 8.9–10.3)
Chloride: 109 mmol/L (ref 98–111)
Creatinine, Ser: 0.93 mg/dL (ref 0.44–1.00)
GFR, Estimated: >60 mL/min (ref >60)
Glucose, Bld: 131 mg/dL — ABNORMAL HIGH (ref 70–99)
Potassium: 3.7 mmol/L (ref 3.5–5.1)
Sodium: 138 mmol/L (ref 135–145)

## 2021-02-16 LAB — PHOSPHORUS: Phosphorus: 4.5 mg/dL (ref 2.5–4.6)

## 2021-02-16 LAB — SEDIMENTATION RATE: Sed Rate: 1 mm/hr (ref 0–22)

## 2021-02-16 LAB — C-REACTIVE PROTEIN: CRP: 1.4 mg/dL — ABNORMAL HIGH (ref <1.0)

## 2021-02-16 LAB — ECHOCARDIOGRAM COMPLETE
Area-P 1/2: 3.91 cm2
S' Lateral: 2.4 cm
Weight: 2878.33 oz

## 2021-02-16 LAB — CREATININE, SERUM
Creatinine, Ser: 1.04 mg/dL — ABNORMAL HIGH (ref 0.44–1.00)
GFR, Estimated: >60 mL/min (ref >60)

## 2021-02-16 LAB — TROPONIN I (HIGH SENSITIVITY): Troponin I (High Sensitivity): 4 ng/L (ref <18)

## 2021-02-16 MED ORDER — TOPIRAMATE 25 MG PO TABS
50.0000 mg | ORAL_TABLET | Freq: Two times a day (BID) | ORAL | Status: DC
  Administered 2021-02-16 – 2021-02-17 (×5): 50 mg via ORAL
  Filled 2021-02-16 (×7): qty 2

## 2021-02-16 MED ORDER — COLCHICINE 0.6 MG PO TABS
0.6000 mg | ORAL_TABLET | Freq: Every day | ORAL | Status: DC
  Administered 2021-02-17 – 2021-02-20 (×4): 0.6 mg via ORAL
  Filled 2021-02-16 (×5): qty 1

## 2021-02-16 MED ORDER — IBUPROFEN 600 MG PO TABS
800.0000 mg | ORAL_TABLET | Freq: Three times a day (TID) | ORAL | Status: DC
  Administered 2021-02-16 – 2021-02-18 (×6): 800 mg via ORAL
  Filled 2021-02-16 (×6): qty 1

## 2021-02-16 MED ORDER — MORPHINE SULFATE (PF) 4 MG/ML IV SOLN
4.0000 mg | Freq: Once | INTRAVENOUS | Status: AC
  Administered 2021-02-16: 4 mg via INTRAVENOUS
  Filled 2021-02-16: qty 1

## 2021-02-16 MED ORDER — MORPHINE SULFATE (PF) 2 MG/ML IV SOLN
2.0000 mg | INTRAVENOUS | Status: DC | PRN
  Administered 2021-02-16 – 2021-02-17 (×4): 2 mg via INTRAVENOUS
  Filled 2021-02-16 (×5): qty 1

## 2021-02-16 MED ORDER — SENNA 8.6 MG PO TABS
1.0000 | ORAL_TABLET | Freq: Every day | ORAL | Status: DC
  Administered 2021-02-16 – 2021-02-17 (×2): 8.6 mg via ORAL
  Filled 2021-02-16 (×3): qty 1

## 2021-02-16 MED ORDER — ONDANSETRON HCL 4 MG/2ML IJ SOLN: 4.0000 mg | Freq: Four times a day (QID) | INTRAMUSCULAR | Status: DC | PRN

## 2021-02-16 MED ORDER — MELATONIN 3 MG PO TABS
3.0000 mg | ORAL_TABLET | Freq: Every evening | ORAL | Status: DC | PRN
  Filled 2021-02-16: qty 1

## 2021-02-16 MED ORDER — PANTOPRAZOLE SODIUM 40 MG PO TBEC
40.0000 mg | DELAYED_RELEASE_TABLET | Freq: Every day | ORAL | Status: DC
  Administered 2021-02-16 – 2021-02-17 (×2): 40 mg via ORAL
  Filled 2021-02-16 (×3): qty 1

## 2021-02-16 MED ORDER — COLCHICINE 0.6 MG PO TABS
0.6000 mg | ORAL_TABLET | Freq: Two times a day (BID) | ORAL | Status: AC
  Administered 2021-02-16 (×2): 0.6 mg via ORAL
  Filled 2021-02-16 (×2): qty 1

## 2021-02-16 MED ORDER — LACTATED RINGERS IV SOLN: INTRAVENOUS | Status: AC

## 2021-02-16 MED ORDER — ELETRIPTAN HYDROBROMIDE 20 MG PO TABS
40.0000 mg | ORAL_TABLET | ORAL | Status: DC | PRN
  Filled 2021-02-16: qty 2

## 2021-02-16 MED ORDER — ACETAMINOPHEN 325 MG PO TABS
650.0000 mg | ORAL_TABLET | Freq: Four times a day (QID) | ORAL | Status: DC | PRN
  Administered 2021-02-16 – 2021-02-17 (×3): 650 mg via ORAL
  Filled 2021-02-16 (×3): qty 2

## 2021-02-16 NOTE — Progress Notes (Signed)
EEG completed, results pending. 

## 2021-02-16 NOTE — H&P (Addendum)
History and Physical  Jill Jenkins:[id number] DOB: 1966/05/23 DOA: 02/15/2021  Referring physician: Overton Mam, Leland PCP: Chesley Noon, MD  Outpatient Specialists: None Patient coming from: Home via EMS.  Chief Complaint: Chest pain followed by witnessed syncope.  HPI: Jill Jenkins is a 55 y.o. female with medical history significant for migraines, who presented to [facility] ED due to sudden onset severe exertional substernal chest pain.  She describes it as sharp, nonradiating.  Worse with taking a breath.  Associated with dyspnea.  She took 2 baby aspirin at home prior to EMS arrival.  Had laid on the couch while her husband was on the phone with 911 had a witnessed syncopal event.  He noted that her right hand was shaking while laying unconscious for about 3 minutes.  No falls.  No recent surgeries, however reports a 16-hour road trip to [place] 2 weeks ago, states she took breaks.  She received 3 additional baby aspirin in the ambulance.  She was brought into the ED for further evaluation.  Chest pain improved in the ED with a dose of IV morphine.  Work-up in the ED revealed negative troponin x2.  Elevated D-dimer at 0.62.  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  COVID-19 screening test negative.  At the time of this visit patient continues to have pleuritic pain with conversational dyspnea.  On exam noted tenderness with palpation of right lower extremity.  Doppler ultrasound ordered to rule out DVT.  EDP requested admission for syncope work-up and to rule out ACS.  TR[facility], hospitalist team, was asked to admit.  ED Course:  Temperature 98.9.  BP 129/86, pulse 72, respiratory 16, O2 saturation 100% on room air.  COVID-19 screening test negative.  Lab studies remarkable for troponin negative 4, 3.  WBC 11.5, hemoglobin 15.0, platelet 204.  D-dimer 0.62.  Chest x-ray no active cardiopulmonary disease.  CTA PE with findings as stated above.  Review of  Systems: Review of systems as noted in the HPI. All other systems reviewed and are negative.   Past Medical History:  Diagnosis Date  . Insomnia   . Low back pain   . Menorrhagia 03/28/2012  . Migraines   . Renal calculus    Past Surgical History:  Procedure Laterality Date  . DILATION AND CURETTAGE OF UTERUS    . POLYPECTOMY  03/29/2012   Procedure: POLYPECTOMY;  Surgeon: Thornell Sartorius, MD;  Location: [facility];  Service: Gynecology;  Laterality: N/A;    Social History:  reports that she has never smoked. She has never used smokeless tobacco. She reports that she does not drink alcohol and does not use drugs.   Allergies  Allergen Reactions  . No Known Allergies     Family History  Problem Relation Age of Onset  . Hypertension Mother   . Hypertension Father   . Stroke Maternal Grandfather     Mother with pacemaker placement in her 11s.  Prior to Admission medications   Medication Sig Start Date End Date Taking? Authorizing Provider  eletriptan (RELPAX) 40 MG tablet Take 1 tablet (40 mg total) by mouth as needed for migraine or headache (For acute migraine treatment; may repeat x1 PRN >2 hrs; max dose 80mg /24hr). 01/09/21   Frann Rider, NP  estradiol (ESTRACE) 0.1 MG/GM vaginal cream Estrace 0.01% (0.1 mg/gram) vaginal cream    [provider]  topiramate (TOPAMAX) 50 MG tablet Take 1 tablet (50 mg total) by mouth  2 (two) times daily. 01/09/21   Frann Rider, NP  Tretinoin Microsphere 0.06 % GEL Retin-A Micro Pump 0.06 % topical gel  1 APPLICATION THIN LAYER TO FACE NIGHTLY    [provider]    Physical Exam: BP 129/86   Pulse 71   Temp 98.8 F (37.1 C) (Oral)   Resp 18   SpO2 100%   . General: 55 y.o. year-old female well developed well nourished in no acute distress.  Alert and oriented x3.  Conversational dyspnea noted.  Uncomfortable due to pleuritic pain. . Cardiovascular: Regular rate and rhythm with no rubs or gallops.  No thyromegaly or JVD  noted.  No lower extremity edema. 2/4 pulses in all 4 extremities. Marland Kitchen Respiratory: Clear to auscultation with no wheezes or rales.  Poor inspiratory effort. . Abdomen: Soft nontender nondistended with normal bowel sounds x4 quadrants. . Muskuloskeletal: Right lower extremity tenderness with minimal palpation. . Neuro: CN II-XII intact, strength, sensation, reflexes . Skin: No ulcerative lesions noted or rashes . Psychiatry: Judgement and insight appear normal. Mood is appropriate for condition and setting          Labs on Admission:  Basic Metabolic Panel: Recent Labs  Lab 02/15/21 1502  NA 140  K 3.9  CL 110  CO2 19*  GLUCOSE 100*  BUN 15  CREATININE 1.04*  CALCIUM 9.7   Liver Function Tests: Recent Labs  Lab 02/15/21 1951  AST 24  ALT 38  ALKPHOS 39  BILITOT 0.7  PROT 6.8  ALBUMIN 3.9   Recent Labs  Lab 02/15/21 1951  LIPASE 31   No results for input(s): AMMONIA in the last 168 hours. CBC: Recent Labs  Lab 02/15/21 1502  WBC 11.5*  HGB 15.0  HCT 46.3*  MCV 97.3  PLT 204   Cardiac Enzymes: No results for input(s): CKTOTAL, CKMB, CKMBINDEX, TROPONINI in the last 168 hours.  BNP (last 3 results) No results for input(s): BNP in the last 8760 hours.  ProBNP (last 3 results) No results for input(s): PROBNP in the last 8760 hours.  CBG: No results for input(s): GLUCAP in the last 168 hours.  Radiological Exams on Admission: DG Chest 2 View  Result Date: 02/15/2021 CLINICAL DATA:  Chest pain and shortness of breath. EXAM: CHEST - 2 VIEW COMPARISON:  None. FINDINGS: The heart size and mediastinal contours are within normal limits. Both lungs are clear. The visualized skeletal structures are unremarkable. IMPRESSION: No active cardiopulmonary disease. Electronically Signed   By: Virgina Norfolk M.D.   On: 02/15/2021 15:17    EKG: I independently viewed the EKG done and my findings are as followed: Sinus rhythm rate of 82.  Nonspecific ST-T changes.  QTc  418.  Assessment/Plan Present on Admission: . Syncope  Active Problems:   Syncope  Witnessed syncope post sudden onset substernal sharp chest pain. Syncope was witnessed by her husband, reported loss of consciousness for about 3 minutes and associated with hand shaking while unconscious.  Obtain orthostatic vital signs  EEG to rule out active seizure. Obtain 2D echo Monitor on telemetry PT OT assessment when hemodynamically stable Fall precautions Gentle IV fluid hydration  Atypical chest pain, rule out ACS Troponin negative x2 No evidence of acute ischemia on twelve-lead EKG Continue to monitor on telemetry CTA PE is negative for pulmonary embolism. No evidence of pneumothorax. Due to persistent symptomatology, will repeat troponin Obtain fasting lipid panel in the morning.  Elevated D-dimer, PE ruled out, rule out DVT. D-dimer 0.62 CTA  PE negative. Right lower extremity tenderness with minimal palpation on exam. 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 Emilio Aspen, pericardial cyst, likely not contributing to presenting symptoms.  Cardiology will see in the morning.  AKI suspect prerenal in the setting of dehydration. Baseline creatinine appears to be 0.7 with GFR greater than 60. Presented with creatinine of 1.04  Gentle IV fluid hydration Monitor urine output Repeat BMP in the morning.  Mild normal anion gap metabolic acidosis in the setting of AKI Serum bicarb 19, anion gap 11 Continue gentle IV fluid hydration Repeat BMP in the morning.  Leukocytosis, rule out active infective process Follow urine analysis No active disease on chest x-ray.  Migraines Resume home regimen.    DVT prophylaxis: Subcu Lovenox daily.  Code Status: Full code.  Family Communication: Husband at bedside.  All questions  answered to the best of my ability.  Disposition Plan: Admit to telemetry cardiac unit.  Consults called: Routine consult to cardiology, will see in the morning.  Admission status: Observation status.   Status is: Observation    Dispo:  Patient From: Home  Planned Disposition: Home possibly on 02/16/2021 once symptomatology has improved and work-up for the elevated D-dimer is completed and cardiology signs off.  Medically stable for discharge: No, ongoing work-up for chest pain and elevated D-dimer.      Kayleen Memos MD [facility] Pager [phone]  If 7PM-7AM, please contact night-coverage [email or web address] Password [email or web address]  02/16/2021, 12:00 AM

## 2021-02-16 NOTE — ED Notes (Signed)
This RN called to give report and at this time the nurse is unable to receive patient

## 2021-02-16 NOTE — ED Notes (Signed)
Tele  Breakfast Ordered 

## 2021-02-16 NOTE — Procedures (Signed)
Patient Name: Jill Jenkins  MRN: [id number]  Epilepsy Attending: Lora Havens  Referring Physician/Provider: Dr Irene Pap Date: 02/16/2021 Duration: 22.34 mins  Patient history: 55yo F with syncope. EEG to evaluate for seizure  Level of alertness: Awake, asleep  AEDs during EEG study: TPM  Technical aspects: This EEG study was done with scalp electrodes positioned according to the 10-20 International system of electrode placement. Electrical activity was acquired at a sampling rate of 500Hz  and reviewed with a high frequency filter of 70Hz  and a low frequency filter of 1Hz . EEG data were recorded continuously and digitally stored.   Description: The posterior dominant rhythm consists of 9 Hz activity of moderate voltage (25-35 uV) seen predominantly in posterior head regions, symmetric and reactive to eye opening and eye closing. Sleep was characterized by vertex waves, sleep spindles (12 to 14 Hz), maximal frontocentral region. Physiologic photic driving was seen during photic stimulation.  Hyperventilation was not performed.     IMPRESSION: This study is within normal limits. No seizures or epileptiform discharges were seen throughout the recording.   Rayley Gao Barbra Sarks

## 2021-02-16 NOTE — Progress Notes (Signed)
Bilateral lower extremity venous duplex has been completed. Preliminary results can be found in CV Proc through chart review.   02/16/21 12:33 PM Jill Jenkins RVT

## 2021-02-16 NOTE — Plan of Care (Signed)
  Problem: Health Behavior/Discharge Planning: Goal: Ability to manage health-related needs will improve Outcome: Progressing   

## 2021-02-16 NOTE — Consult Note (Addendum)
Cardiology Consultation:   Patient ID: TONJI ELLIFF MRN: [id number]; DOB: 1966/01/09  Admit date: 02/15/2021 Date of Consult: 02/16/2021  PCP:  Chesley Noon, MD   [place] Cardiologist: New to [facility]   Patient Profile:   Jill Jenkins is a 55 y.o. 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 Nevada Crane.  History of Present Illness:   Ms. Bradt presented to [facility] ED on 02/15/2021 for evaluation of chest pain. In talking with the patient today, she reports being in her usual state of health until yesterday afternoon when she developed chest pain while doing laundry. Reports a significant shooting pain between her breasts and reports associated dizziness and dyspnea at that time. No radiating pain down her arms or up to her jaw. She notified family members and upon sitting down due to her dizziness, she lost consciousness for several minutes. No reported bowel or bladder incontinence. Her husband does report her hands were shaking. She reports episodes of dizziness in the past which have been associated with her migraines but denies any LOC previously. No prior chest pain or dyspnea on exertion. She denies any personal history of CAD or CHF. Reports a strong family history of cardiac issues as both of her parents have pacemakers but she reports they were older when these were placed. Maternal uncles have undergone bypass surgery and also a history of congenital heart disease in her mother's family.   Initial labs show WBC 11.5, Hgb 15.0, platelets 204, Na+ 140, K+ 3.9 and creatinine 1.04. Initial Hs Troponin 3 with repeat values of 4. D-dimer 0.62. COVID negative. FLP shows total cholesterol of 192, HDL 48, triglycerides 133 and LDL 117. EKG shows sinus rhythm, HR 82 with no distinct ST abnormalities. CXR with no active cardiopulmonary disease. CTA  showed no evidence of a PE and mentioned no atherosclerotic plaque and no definite coronary artery calcifications. 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 Also had multiple fluid densities along the liver felt to be consistent with hepatic cysts.   She reports still having intense chest pain at this time which is worse with deep breaths or lying flat.    Past Medical History:  Diagnosis Date  . Insomnia   . Low back pain   . Menorrhagia 03/28/2012  . Migraines   . Renal calculus     Past Surgical History:  Procedure Laterality Date  . DILATION AND CURETTAGE OF UTERUS    . POLYPECTOMY  03/29/2012   Procedure: POLYPECTOMY;  Surgeon: Thornell Sartorius, MD;  Location: [facility];  Service: Gynecology;  Laterality: N/A;     Home Medications:  Prior to Admission medications   Medication Sig Start Date End Date Taking? Authorizing Provider  eletriptan (RELPAX) 40 MG tablet Take 1 tablet (40 mg total) by mouth as needed for migraine or headache (For acute migraine treatment; may repeat x1 PRN >2 hrs; max dose 62m/24hr). 01/09/21  Yes McCue, JJanett Billow NP  estradiol (ESTRACE) 0.1 MG/GM vaginal cream Place 1 Applicatorful vaginally 3 (three) times a week.   Yes [provider]  topiramate (TOPAMAX) 50 MG tablet Take 1 tablet (50 mg total) by mouth 2 (two) times daily. 01/09/21  Yes McCue, JJanett Billow NP  Tretinoin Microsphere 0.06 % GEL Retin-A  Micro Pump 0.06 % topical gel  1 APPLICATION THIN LAYER TO FACE NIGHTLY   Yes [provider]    Inpatient Medications: Scheduled Meds: . enoxaparin (LOVENOX) injection  40 mg Subcutaneous Q24H  . senna  1 tablet Oral Daily  . sodium chloride flush  3 mL Intravenous Q12H  . topiramate  50 mg Oral BID   Continuous Infusions: . lactated ringers 50 mL/hr at 02/16/21 0038   PRN Meds: acetaminophen, eletriptan, melatonin, morphine injection, ondansetron  (ZOFRAN) IV  Allergies:    Allergies  Allergen Reactions  . No Known Allergies     Social History:   Social History   Socioeconomic History  . Marital status: Married    Spouse name: Not on file  . Number of children: Not on file  . Years of education: Not on file  . Highest education level: Not on file  Occupational History  . Not on file  Tobacco Use  . Smoking status: Never Smoker  . Smokeless tobacco: Never Used  Substance and Sexual Activity  . Alcohol use: No  . Drug use: No  . Sexual activity: Yes    Birth control/protection: Surgical  Other Topics Concern  . Not on file  Social History Narrative  . Not on file   Social Determinants of Health   Financial Resource Strain: Not on file  Food Insecurity: Not on file  Transportation Needs: Not on file  Physical Activity: Not on file  Stress: Not on file  Social Connections: Not on file  Intimate Partner Violence: Not on file    Family History:    Family History  Problem Relation Age of Onset  . Hypertension Mother   . Hypertension Father   . Stroke Maternal Grandfather      ROS:  Please see the history of present illness.   All other ROS reviewed and negative.     Physical Exam/Data:   Vitals:   02/16/21 0500 02/16/21 0600 02/16/21 0700 02/16/21 0859  BP: 117/80 115/80 125/78 120/84  Pulse: 76 73 72 83  Resp: '20 20 18 18  ' Temp:    98.7 F (37.1 C)  TempSrc:    Oral  SpO2: 98% 97% 98% 99%  Weight:    81.6 kg   No intake or output data in the 24 hours ending 02/16/21 0907 Last 3 Weights 02/16/2021 01/09/2021 07/09/2020  Weight (lbs) 179 lb 14.3 oz 205 lb 195 lb  Weight (kg) 81.6 kg 92.987 kg 88.451 kg     Body mass index is 25.81 kg/m.  General:  Well nourished, well developed female appearing in no acute distress. HEENT: normal Lymph: no adenopathy Neck: no JVD Endocrine:  No thryomegaly Vascular: No carotid bruits; FA pulses 2+ bilaterally without bruits  Cardiac:  normal S1, S2; RRR;  no murmur. Lungs:  clear to auscultation bilaterally, no wheezing, rhonchi or rales  Abd: Does not appear distended. Ext: no edema Musculoskeletal:  No deformities, BUE and BLE strength normal and equal Skin: warm and dry  Neuro:  CNs 2-12 intact, no focal abnormalities noted Psych:  Normal affect   EKG:  The EKG was personally reviewed and demonstrates: Sinus rhythm, HR 82 with no distinct ST abnormalities.  Telemetry:  Telemetry was personally reviewed and demonstrates: Sinus rhythm, HR in 70's to 80's with 8 beats of narrow-complex tachycardia, most consistent with brief SVT.   Relevant CV Studies:  Echocardiogram: Pending  Laboratory Data:  High Sensitivity Troponin:   Recent Labs  Lab 02/15/21  1502 02/15/21 1920 02/16/21 0200  TROPONINIHS '3 4 4     ' Chemistry Recent Labs  Lab 02/15/21 1502 02/16/21 0200  NA 140 138  K 3.9 3.7  CL 110 109  CO2 19* 20*  GLUCOSE 100* 131*  BUN 15 12  CREATININE 1.04* 0.93  CALCIUM 9.7 8.9  GFRNONAA >60 >60  ANIONGAP 11 9    Recent Labs  Lab 02/15/21 1951  PROT 6.8  ALBUMIN 3.9  AST 24  ALT 38  ALKPHOS 39  BILITOT 0.7   Hematology Recent Labs  Lab 02/15/21 1502 02/16/21 0200  WBC 11.5* 13.1*  RBC 4.76 4.31  HGB 15.0 13.7  HCT 46.3* 41.7  MCV 97.3 96.8  MCH 31.5 31.8  MCHC 32.4 32.9  RDW 12.8 13.0  PLT 204 186   BNPNo results for input(s): BNP, PROBNP in the last 168 hours.  DDimer  Recent Labs  Lab 02/15/21 1951  DDIMER 0.62*     Radiology/Studies:  DG Chest 2 View  Result Date: 02/15/2021 CLINICAL DATA:  Chest pain and shortness of breath. EXAM: CHEST - 2 VIEW COMPARISON:  None. FINDINGS: The heart size and mediastinal contours are within normal limits. Both lungs are clear. The visualized skeletal structures are unremarkable. IMPRESSION: No active cardiopulmonary disease. Electronically Signed   By: Virgina Norfolk M.D.   On: 02/15/2021 15:17   CT Angio Chest PE W and/or Wo Contrast  Result Date:  02/16/2021 CLINICAL DATA:  sharp cp. Patient stated that the pain started after putting sheets on the bed. PE suspected, low/intermediate prob, positive D-dimer EXAM: CT ANGIOGRAPHY CHEST WITH CONTRAST TECHNIQUE: Multidetector CT imaging of the chest was performed using the standard protocol during bolus administration of intravenous contrast. Multiplanar CT image reconstructions and MIPs were obtained to evaluate the vascular anatomy. CONTRAST:  14m OMNIPAQUE IOHEXOL 350 MG/ML SOLN COMPARISON:  None. FINDINGS: Cardiovascular: Satisfactory opacification of the pulmonary arteries to the segmental level. No evidence of pulmonary embolism. Normal heart size. No significant pericardial effusion. The thoracic aorta is normal in caliber. No atherosclerotic plaque of the thoracic aorta. No definite coronary artery calcifications. 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 No enlarged mediastinal, hilar, or axillary lymph nodes. Thyroid gland, trachea, and esophagus demonstrate no significant findings. Lungs/Pleura: Bilateral lower lobe subsegmental atelectasis. No focal consolidation. No pulmonary nodule. No pulmonary mass. No pleural effusion. No pneumothorax Upper Abdomen: Multiple fluid density lesions within the liver likely represent hepatic cysts with the largest in the left hepatic lobe measuring up to 3.8 cm. Musculoskeletal: No chest wall abnormality.  Bilateral breast implants. No suspicious lytic or blastic osseous lesions. No acute displaced fracture. Multilevel degenerative changes of the spine. Review of the MIP images confirms the above findings. IMPRESSION: 1. No pulmonary embolus. 2. No acute intrathoracic abnormality. 3. 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 4. Multiple fluid density lesions within the liver statistically likely represent hepatic cysts. Electronically Signed   By: MIven Finn M.D.   On: 02/16/2021 00:08     Assessment and Plan:   1. Pleuritic Chest Pain - Her episodes of chest pain are mostly pleuritic as they are exacerbated with taking a deep breath. Also worse with lying down. Denies any viral prodrome.  - Her Hs Troponin values have been negative and EKG is without distinct ST changes but no prior tracings available for comparison. D-dimer was elevated but CTA negative for  a PE. Also did not show any definitive coronary calcifications. Echocardiogram pending.  - Will check ESR and CRP. If elevated, would plan to start high-dose NSAIDS and Colchicine.   2. 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  - Echocardiogram pending for further evaluation. Would likely anticipate a cMRI later this admission as well pending echo read.   3. Syncope  - Unclear etiology. She does report dizziness and chest pain prior to the event. Has experienced dizziness in the past due to migraine headaches but no actual LOC. Family members do report her hands were shaking at the time but no bowel or bladder incontinence.  - Echo pending. EEG also ordered by the admitting team. Continue to follow on telemetry. Thus far, she has only had an 8 beat episode of what appears most consistent with SVT. Mg at 1.8 and K+ at 3.7 this AM. Will add on TSH to prior labs.   4. Leukocytosis - WBC at 11.5 on admission, at 13.1 today. CXR and CTA without acute findings to account for this. UA negative. Further work-up per the admitting team.   5. Hepatic Cysts - CTA this admission shows multiple fluid density lesions within the liver statistically likely representing hepatic cysts. LFT's WNL this admission. Will need to be followed by her PCP as an outpatient.    Risk Assessment/Risk Scores:     HEAR Score (for undifferentiated chest pain):      For questions or updates, please contact  [place] Please consult [email or web address] for contact info under    Signed, Erma Heritage, PA-C  02/16/2021 9:07 AM

## 2021-02-16 NOTE — Progress Notes (Signed)
Jill Jenkins is a 55 y.o. female with medical history significant for migraines, who presented to [facility] ED due to sudden onset severe exertional substernal chest pain.  She describes it as sharp, nonradiating.  Worse with taking a breath.  Associated with dyspnea.  She took 2 baby aspirin at home prior to EMS arrival.  Had laid on the couch while her husband was on the phone with 911 had a witnessed syncopal event.  He noted that her right hand was shaking while laying unconscious for about 3 minutes.    Pt seen and examined.  She was admitted earlier this am by Dr Nevada Crane, please see her note for detailed H&P.    Plan:   Cardiology consult , repeat echocardiogram.  Pain control with colchicine and ibuprofen.  EEG for evaluation of syncope.    Hosie Poisson, MD

## 2021-02-16 NOTE — Progress Notes (Signed)
*  PRELIMINARY RESULTS* Echocardiogram 2D Echocardiogram has been performed.  Jill Jenkins 02/16/2021, 2:11 PM

## 2021-02-17 ENCOUNTER — Observation Stay (HOSPITAL_COMMUNITY): Payer: BC Managed Care – PPO

## 2021-02-17 DIAGNOSIS — Z823 Family history of stroke: Secondary | ICD-10-CM | POA: Diagnosis not present

## 2021-02-17 DIAGNOSIS — I471 Supraventricular tachycardia: Secondary | ICD-10-CM | POA: Diagnosis present

## 2021-02-17 DIAGNOSIS — Z8249 Family history of ischemic heart disease and other diseases of the circulatory system: Secondary | ICD-10-CM | POA: Diagnosis not present

## 2021-02-17 DIAGNOSIS — I300 Acute nonspecific idiopathic pericarditis: Secondary | ICD-10-CM | POA: Diagnosis present

## 2021-02-17 DIAGNOSIS — Z9889 Other specified postprocedural states: Secondary | ICD-10-CM | POA: Diagnosis not present

## 2021-02-17 DIAGNOSIS — G43909 Migraine, unspecified, not intractable, without status migrainosus: Secondary | ICD-10-CM | POA: Diagnosis present

## 2021-02-17 DIAGNOSIS — N179 Acute kidney failure, unspecified: Secondary | ICD-10-CM | POA: Diagnosis present

## 2021-02-17 DIAGNOSIS — Z20822 Contact with and (suspected) exposure to covid-19: Secondary | ICD-10-CM | POA: Diagnosis present

## 2021-02-17 DIAGNOSIS — B349 Viral infection, unspecified: Secondary | ICD-10-CM | POA: Diagnosis present

## 2021-02-17 DIAGNOSIS — J9811 Atelectasis: Secondary | ICD-10-CM | POA: Diagnosis present

## 2021-02-17 DIAGNOSIS — G4700 Insomnia, unspecified: Secondary | ICD-10-CM | POA: Diagnosis present

## 2021-02-17 DIAGNOSIS — Z79899 Other long term (current) drug therapy: Secondary | ICD-10-CM | POA: Diagnosis not present

## 2021-02-17 DIAGNOSIS — R55 Syncope and collapse: Secondary | ICD-10-CM | POA: Diagnosis present

## 2021-02-17 DIAGNOSIS — I712 Thoracic aortic aneurysm, without rupture: Secondary | ICD-10-CM | POA: Diagnosis present

## 2021-02-17 DIAGNOSIS — K7689 Other specified diseases of liver: Secondary | ICD-10-CM | POA: Diagnosis present

## 2021-02-17 DIAGNOSIS — E860 Dehydration: Secondary | ICD-10-CM | POA: Diagnosis present

## 2021-02-17 DIAGNOSIS — I318 Other specified diseases of pericardium: Secondary | ICD-10-CM | POA: Diagnosis present

## 2021-02-17 DIAGNOSIS — R079 Chest pain, unspecified: Secondary | ICD-10-CM | POA: Diagnosis not present

## 2021-02-17 DIAGNOSIS — I301 Infective pericarditis: Secondary | ICD-10-CM | POA: Diagnosis not present

## 2021-02-17 DIAGNOSIS — Q248 Other specified congenital malformations of heart: Secondary | ICD-10-CM | POA: Diagnosis not present

## 2021-02-17 LAB — GLUCOSE, CAPILLARY: Glucose-Capillary: 112 mg/dL — ABNORMAL HIGH (ref 70–99)

## 2021-02-17 MED ORDER — GADOBUTROL 1 MMOL/ML IV SOLN
12.0000 mL | Freq: Once | INTRAVENOUS | Status: AC | PRN
  Administered 2021-02-17: 12 mL via INTRAVENOUS

## 2021-02-17 MED ORDER — SENNOSIDES-DOCUSATE SODIUM 8.6-50 MG PO TABS
2.0000 | ORAL_TABLET | Freq: Two times a day (BID) | ORAL | Status: DC
  Administered 2021-02-17 – 2021-02-19 (×4): 2 via ORAL
  Filled 2021-02-17 (×6): qty 2

## 2021-02-17 MED ORDER — POLYETHYLENE GLYCOL 3350 17 G PO PACK
17.0000 g | PACK | Freq: Every day | ORAL | Status: DC
  Administered 2021-02-17: 17 g via ORAL
  Filled 2021-02-17 (×4): qty 1

## 2021-02-17 MED ORDER — MORPHINE SULFATE (PF) 2 MG/ML IV SOLN
2.0000 mg | Freq: Once | INTRAVENOUS | Status: AC
Start: 2021-02-17 — End: 2021-02-17
  Administered 2021-02-17: 2 mg via INTRAVENOUS
  Filled 2021-02-17: qty 1

## 2021-02-17 MED ORDER — KETOROLAC TROMETHAMINE 15 MG/ML IJ SOLN
15.0000 mg | Freq: Two times a day (BID) | INTRAMUSCULAR | Status: DC | PRN
  Administered 2021-02-17: 15 mg via INTRAVENOUS
  Filled 2021-02-17 (×2): qty 1

## 2021-02-17 NOTE — Progress Notes (Signed)
PROGRESS NOTE    Jill Jenkins  ZOX:[id number] DOB: November 25, 1965 DOA: 02/15/2021 PCP: Chesley Noon, MD    No chief complaint on file.   Brief Narrative:   Jill Jenkins a 55 y.o.femalewith medical history significant formigraines, who presented to [facility] ED due to sudden onsetsevereexertional substernal chest pain, sharp, worsening with breathing, associated with sob.patient's husband  reports she was lying on the  couch while her husband was on the phone with 911 had a witnessed syncopal event.He noted that her right hand was shakingwhile layingunconscious for about 3 minutes. 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 She is scheduled for cardiac MRI. Meanwhile she is being treated for acute pericarditis with Colchicine and Ibuprofen. EEG ordered for evaluation of syncope.    Assessment & Plan:   Active Problems:   Syncope and collapse   Chest pain   Acute idiopathic pericarditis   Pericardial cyst   Pleuritic chest pain/ atypical chest pain.  Improving but not resolved yet. Initial troponins have been negative.  EKG does not show any ischemic changes.   CTA showed Incidentally noted likely 4.3 cm pericardial cyst. Multiple fluid density lesions within the liver statistically likely represent hepatic cysts. No pulmonary embolus.  Repeat echocardiogram is unremarkable.Cardiology on board. Plan for cardiac MRI later today.  Pain control with colchicine and ibuprofen. ESR is negative.  CRP is 1.4 TSH wnl.    Syncope: Probably vasovagal in origin.  Echocardiogram unremarkable.  EEG does not show any seizures or epileptiform discharges.  Therapy evaluations are pending.    Leukocytosis Resolved   Hepatic cysts Multiple liver cysts found incidentally.  Liver enzymes within normal limits on admission. Recommend outpatient follow-up with PCP.   DVT prophylaxis: (Lovenox) Code Status: (Full  code) Family Communication: none at bedside.  Disposition:   Status is: Observation  The patient will require care spanning > 2 midnights and should be moved to inpatient because: Ongoing diagnostic testing needed not appropriate for outpatient work up and Unsafe d/c plan  Dispo:  Patient From: Home  Planned Disposition: Home  Medically stable for discharge: No      Consultants:   Cardiology      Procedures: EEG  Echocardiogram.   Antimicrobials:  Antibiotics Given (last 72 hours)    None          Subjective: Chest pain persistent, severe last night requring IV morphine.   Objective: Vitals:   02/16/21 1940 02/16/21 2330 02/17/21 0210 02/17/21 0423  BP: 100/73 107/74  114/79  Pulse: 77 70  75  Resp: 20 (!) 23 (!) 21 18  Temp: 98.4 F (36.9 C) 98.7 F (37.1 C)  98.4 F (36.9 C)  TempSrc: Oral Oral  Oral  SpO2: 97% 98%  97%  Weight:   91.5 kg     Intake/Output Summary (Last 24 hours) at 02/17/2021 1043 Last data filed at 02/16/2021 2200 Gross per 24 hour  Intake 1506.43 ml  Output --  Net 1506.43 ml   Filed Weights   02/16/21 0859 02/17/21 0210  Weight: 81.6 kg 91.5 kg    Examination:  General exam: Appears calm and comfortable  Respiratory system: Clear to auscultation. Respiratory effort normal. Cardiovascular system: S1 & S2 heard, RRR. No JVD, murmurs,  No pedal edema. Gastrointestinal system: Abdomen is nondistended, soft and nontender. Normal bowel sounds heard. Central nervous system: Alert and oriented. No focal neurological deficits. Extremities: Symmetric 5 x 5  power. Skin: No rashes, lesions or ulcers Psychiatry:  Mood & affect appropriate.     Data Reviewed: I have personally reviewed following labs and imaging studies  CBC: Recent Labs  Lab 02/15/21 1502 02/16/21 0200 02/16/21 1052  WBC 11.5* 13.1* 10.5  HGB 15.0 13.7 14.0  HCT 46.3* 41.7 41.5  MCV 97.3 96.8 95.4  PLT 204 186 660    Basic Metabolic Panel: Recent Labs   Lab 02/15/21 1502 02/16/21 0200 02/16/21 1052  NA 140 138  --   K 3.9 3.7  --   CL 110 109  --   CO2 19* 20*  --   GLUCOSE 100* 131*  --   BUN 15 12  --   CREATININE 1.04* 0.93 1.04*  CALCIUM 9.7 8.9  --   MG  --  1.8  --   PHOS  --  4.5  --     GFR: Estimated Creatinine Clearance: 75.9 mL/min (A) (by C-G formula based on SCr of 1.04 mg/dL (H)).  Liver Function Tests: Recent Labs  Lab 02/15/21 1951  AST 24  ALT 38  ALKPHOS 39  BILITOT 0.7  PROT 6.8  ALBUMIN 3.9    CBG: Recent Labs  Lab 02/17/21 0610  GLUCAP 112*     Recent Results (from the past 240 hour(s))  Resp Panel by RT-PCR (Flu A&B, Covid) Nasopharyngeal Swab     Status: None   Collection Time: 02/15/21  8:09 PM   Specimen: Nasopharyngeal Swab; Nasopharyngeal(NP) swabs in vial transport medium  Result Value Ref Range Status   SARS Coronavirus 2 by RT PCR NEGATIVE NEGATIVE Final    Comment: (NOTE) SARS-CoV-2 target nucleic acids are NOT DETECTED.  The SARS-CoV-2 RNA is generally detectable in upper respiratory specimens during the acute phase of infection. The lowest concentration of SARS-CoV-2 viral copies this assay can detect is 138 copies/mL. A negative result does not preclude SARS-Cov-2 infection and should not be used as the sole basis for treatment or other patient management decisions. A negative result may occur with  improper specimen collection/handling, submission of specimen other than nasopharyngeal swab, presence of viral mutation(s) within the areas targeted by this assay, and inadequate number of viral copies(<138 copies/mL). A negative result must be combined with clinical observations, patient history, and epidemiological information. The expected result is Negative.  Fact Sheet for Patients:  EntrepreneurPulse.com.au  Fact Sheet for Healthcare Providers:  IncredibleEmployment.be  This test is no t yet approved or cleared by the [place] FDA and  has been authorized for detection and/or diagnosis of SARS-CoV-2 by FDA under an Emergency Use Authorization (EUA). This EUA will remain  in effect (meaning this test can be used) for the duration of the COVID-19 declaration under Section 564(b)(1) of the Act, 21 U.S.C.section 360bbb-3(b)(1), unless the authorization is terminated  or revoked sooner.       Influenza A by PCR NEGATIVE NEGATIVE Final   Influenza B by PCR NEGATIVE NEGATIVE Final    Comment: (NOTE) The Xpert Xpress SARS-CoV-2/FLU/RSV plus assay is intended as an aid in the diagnosis of influenza from Nasopharyngeal swab specimens and should not be used as a sole basis for treatment. Nasal washings and aspirates are unacceptable for Xpert Xpress SARS-CoV-2/FLU/RSV testing.  Fact Sheet for Patients: EntrepreneurPulse.com.au  Fact Sheet for Healthcare Providers: IncredibleEmployment.be  This test is not yet approved or cleared by the [place] FDA and has been authorized for detection and/or diagnosis of SARS-CoV-2 by FDA under an Emergency Use Authorization (  EUA). This EUA will remain in effect (meaning this test can be used) for the duration of the COVID-19 declaration under Section 564(b)(1) of the Act, 21 U.S.C. section 360bbb-3(b)(1), unless the authorization is terminated or revoked.  Performed at [facility], [place] [address] [place], [place] [address]          Radiology Studies: DG Chest 2 View  Result Date: 02/15/2021 CLINICAL DATA:  Chest pain and shortness of breath. EXAM: CHEST - 2 VIEW COMPARISON:  None. FINDINGS: The heart size and mediastinal contours are within normal limits. Both lungs are clear. The visualized skeletal structures are unremarkable. IMPRESSION: No active cardiopulmonary disease. Electronically Signed   By: Virgina Norfolk M.D.   On: 02/15/2021 15:17   CT Angio Chest PE W and/or Wo Contrast  Result Date:  02/16/2021 CLINICAL DATA:  sharp cp. Patient stated that the pain started after putting sheets on the bed. PE suspected, low/intermediate prob, positive D-dimer EXAM: CT ANGIOGRAPHY CHEST WITH CONTRAST TECHNIQUE: Multidetector CT imaging of the chest was performed using the standard protocol during bolus administration of intravenous contrast. Multiplanar CT image reconstructions and MIPs were obtained to evaluate the vascular anatomy. CONTRAST:  31m OMNIPAQUE IOHEXOL 350 MG/ML SOLN COMPARISON:  None. FINDINGS: Cardiovascular: Satisfactory opacification of the pulmonary arteries to the segmental level. No evidence of pulmonary embolism. Normal heart size. No significant pericardial effusion. The thoracic aorta is normal in caliber. No atherosclerotic plaque of the thoracic aorta. No definite coronary artery calcifications. 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 No enlarged mediastinal, hilar, or axillary lymph nodes. Thyroid gland, trachea, and esophagus demonstrate no significant findings. Lungs/Pleura: Bilateral lower lobe subsegmental atelectasis. No focal consolidation. No pulmonary nodule. No pulmonary mass. No pleural effusion. No pneumothorax Upper Abdomen: Multiple fluid density lesions within the liver likely represent hepatic cysts with the largest in the left hepatic lobe measuring up to 3.8 cm. Musculoskeletal: No chest wall abnormality.  Bilateral breast implants. No suspicious lytic or blastic osseous lesions. No acute displaced fracture. Multilevel degenerative changes of the spine. Review of the MIP images confirms the above findings. IMPRESSION: 1. No pulmonary embolus. 2. No acute intrathoracic abnormality. 3. 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 4. Multiple fluid density lesions within the liver statistically likely represent hepatic cysts. Electronically Signed   By: MIven Finn M.D.   On: 02/16/2021 00:08   EEG adult  Result Date: 02/16/2021 YLora Havens MD     02/16/2021  4:12 PM Patient Name: MCAIRO LINGENFELTERMRN: [id number] Attending: PLora HavensReferring Physician/Provider: Dr CIrene PapDate: 02/16/2021 Duration: 22.34 mins Patient history: 89yoF with syncope. EEG to evaluate for seizure Level of alertness: Awake, asleep AEDs during EEG study: TPM Technical aspects: This EEG study was done with scalp electrodes positioned according to the 10-20 International system of electrode placement. Electrical activity was acquired at a sampling rate of 500Hz and reviewed with a high frequency filter of 70Hz and a low frequency filter of 1Hz. EEG data were recorded continuously and digitally stored. Description: The posterior dominant rhythm consists of 9 Hz activity of moderate voltage (25-35 uV) seen predominantly in posterior head regions, symmetric and reactive to eye opening and eye closing. Sleep was characterized by vertex waves, sleep spindles (12 to 14 Hz), maximal frontocentral region. Physiologic photic driving was seen during photic stimulation.  Hyperventilation was not performed.  IMPRESSION: This study is within normal limits. No seizures or epileptiform discharges were seen throughout the recording. Lora Havens   ECHOCARDIOGRAM COMPLETE  Result Date: 02/16/2021    ECHOCARDIOGRAM REPORT   Patient Name:   Gloristine P Couillard Date of Exam: 02/16/2021 Medical Rec #:  [id number]      Height:       70.0 in Accession #:    [id number]     Weight:       179.9 lb Date of Birth:  August 21, 1966      BSA:          1.995 m Patient Age:    78 years       BP:           120/84 mmHg Patient Gender: F              HR:           83 bpm. Exam Location:  Inpatient Procedure: 2D Echo, Cardiac Doppler and Color Doppler Indications:    Syncope R55  History:        Patient has no prior history of Echocardiogram examinations.                 Signs/Symptoms:Chest Pain and Syncope. Acute  idiopathic                 pericarditis, Pericardial cyst.  Sonographer:    Alvino Chapel RCS Referring Phys: [id number] [place]  1. Left ventricular ejection fraction, by estimation, is 60 to 65%. The left ventricle has normal function. The left ventricle has no regional wall motion abnormalities. Left ventricular diastolic parameters were normal.  2. Right ventricular systolic function is normal. The right ventricular size is normal. There is normal pulmonary artery systolic pressure.  3. The mitral valve is normal in structure. No evidence of mitral valve regurgitation. No evidence of mitral stenosis.  4. The aortic valve is tricuspid. Aortic valve regurgitation is not visualized. No aortic stenosis is present.  5. The inferior vena cava is normal in size with greater than 50% respiratory variability, suggesting right atrial pressure of 3 mmHg. FINDINGS  Left Ventricle: Left ventricular ejection fraction, by estimation, is 60 to 65%. The left ventricle has normal function. The left ventricle has no regional wall motion abnormalities. The left ventricular internal cavity size was normal in size. There is  no left ventricular hypertrophy. Left ventricular diastolic parameters were normal. Normal left ventricular filling pressure. Right Ventricle: The right ventricular size is normal. No increase in right ventricular wall thickness. Right ventricular systolic function is normal. There is normal pulmonary artery systolic pressure. The tricuspid regurgitant velocity is 2.14 m/s, and  with an assumed right atrial pressure of 3 mmHg, the estimated right ventricular systolic pressure is 09.7 mmHg. Left Atrium: Left atrial size was normal in size. Right Atrium: Right atrial size was normal in size. Pericardium: There is no evidence of pericardial effusion. Mitral Valve: The mitral valve is normal in structure. No evidence of mitral valve regurgitation. No evidence of mitral valve stenosis. Tricuspid Valve:  The tricuspid valve is normal in structure. Tricuspid valve regurgitation is mild . No evidence of tricuspid stenosis. Aortic Valve: The aortic valve is tricuspid. Aortic valve regurgitation is not visualized. No aortic stenosis is present. Pulmonic Valve: The pulmonic valve was normal in structure. Pulmonic valve regurgitation is not visualized. No evidence of pulmonic stenosis. Aorta: The aortic root is normal in size and structure. Venous: The inferior vena  cava is normal in size with greater than 50% respiratory variability, suggesting right atrial pressure of 3 mmHg. IAS/Shunts: No atrial level shunt detected by color flow Doppler.  LEFT VENTRICLE PLAX 2D LVIDd:         4.50 cm  Diastology LVIDs:         2.40 cm  LV e' medial:    8.70 cm/s LV PW:         0.90 cm  LV E/e' medial:  8.8 LV IVS:        0.90 cm  LV e' lateral:   11.00 cm/s LVOT diam:     2.00 cm  LV E/e' lateral: 7.0 LV SV:         84 LV SV Index:   42 LVOT Area:     3.14 cm  RIGHT VENTRICLE RV S prime:     14.10 cm/s TAPSE (M-mode): 2.0 cm LEFT ATRIUM             Index       RIGHT ATRIUM           Index LA diam:        3.00 cm 1.50 cm/m  RA Area:     19.40 cm LA Vol (A2C):   39.9 ml 20.00 ml/m RA Volume:   56.70 ml  28.42 ml/m LA Vol (A4C):   42.8 ml 21.45 ml/m LA Biplane Vol: 43.3 ml 21.70 ml/m  AORTIC VALVE LVOT Vmax:   117.00 cm/s LVOT Vmean:  79.000 cm/s LVOT VTI:    0.266 m  AORTA Ao Root diam: 3.40 cm MITRAL VALVE               TRICUSPID VALVE MV Area (PHT): 3.91 cm    TR Peak grad:   18.3 mmHg MV Decel Time: 194 msec    TR Vmax:        214.00 cm/s MV E velocity: 76.70 cm/s MV A velocity: 66.40 cm/s  SHUNTS MV E/A ratio:  1.16        Systemic VTI:  0.27 m                            Systemic Diam: 2.00 cm Dani Gobble Croitoru MD Electronically signed by Sanda Klein MD Signature Date/Time: 02/16/2021/3:06:02 PM    Final    VAS [place] LOWER EXTREMITY VENOUS (DVT)  Result Date: 02/16/2021  Lower Venous DVT Study Patient Name:  Copelyn P Swango   Date of Exam:   02/16/2021 Medical Rec #: [id number]       Accession #:    [id number] Date of Birth: [birth year]       Patient Gender: F Patient Age:   46Y Exam Location:  [facility] Procedure:      VAS [place] LOWER EXTREMITY VENOUS (DVT) Referring Phys: [id number] [place] --------------------------------------------------------------------------------  Indications: Elevated Ddimer.  Risk Factors: None identified. Comparison Study: No prior studies. Performing Technologist: Oliver Hum RVT  Examination Guidelines: A complete evaluation includes B-mode imaging, spectral Doppler, color Doppler, and power Doppler as needed of all accessible portions of each vessel. Bilateral testing is considered an integral part of a complete examination. Limited examinations for reoccurring indications may be performed as noted. The reflux portion of the exam is performed with the patient in reverse Trendelenburg.  +---------+---------------+---------+-----------+----------+--------------+ RIGHT    CompressibilityPhasicitySpontaneityPropertiesThrombus Aging +---------+---------------+---------+-----------+----------+--------------+ CFV      Full  Yes      Yes                                 +---------+---------------+---------+-----------+----------+--------------+ SFJ      Full                                                        +---------+---------------+---------+-----------+----------+--------------+ FV Prox  Full                                                        +---------+---------------+---------+-----------+----------+--------------+ FV Mid   Full                                                        +---------+---------------+---------+-----------+----------+--------------+ FV DistalFull                                                        +---------+---------------+---------+-----------+----------+--------------+ PFV      Full                                                         +---------+---------------+---------+-----------+----------+--------------+ POP      Full           Yes      Yes                                 +---------+---------------+---------+-----------+----------+--------------+ PTV      Full                                                        +---------+---------------+---------+-----------+----------+--------------+ PERO     Full                                                        +---------+---------------+---------+-----------+----------+--------------+   +---------+---------------+---------+-----------+----------+--------------+ LEFT     CompressibilityPhasicitySpontaneityPropertiesThrombus Aging +---------+---------------+---------+-----------+----------+--------------+ CFV      Full           Yes      Yes                                 +---------+---------------+---------+-----------+----------+--------------+ SFJ  Full                                                        +---------+---------------+---------+-----------+----------+--------------+ FV Prox  Full                                                        +---------+---------------+---------+-----------+----------+--------------+ FV Mid   Full                                                        +---------+---------------+---------+-----------+----------+--------------+ FV DistalFull                                                        +---------+---------------+---------+-----------+----------+--------------+ PFV      Full                                                        +---------+---------------+---------+-----------+----------+--------------+ POP      Full           Yes      Yes                                 +---------+---------------+---------+-----------+----------+--------------+ PTV      Full                                                         +---------+---------------+---------+-----------+----------+--------------+ PERO     Full                                                        +---------+---------------+---------+-----------+----------+--------------+     Summary: RIGHT: - There is no evidence of deep vein thrombosis in the lower extremity.  - No cystic structure found in the popliteal fossa.  LEFT: - There is no evidence of deep vein thrombosis in the lower extremity.  - No cystic structure found in the popliteal fossa.  *See table(s) above for measurements and observations. Electronically signed by Ruta Hinds MD on 02/16/2021 at 5:08:43 PM.    Final         Scheduled Meds: . colchicine  0.6 mg Oral Daily  . enoxaparin (LOVENOX) injection  40 mg Subcutaneous Q24H  . ibuprofen  800 mg Oral Q8H  . pantoprazole  40 mg  Oral Daily  . senna  1 tablet Oral Daily  . sodium chloride flush  3 mL Intravenous Q12H  . topiramate  50 mg Oral BID   Continuous Infusions:   LOS: 0 days        Hosie Poisson, MD [facility]   To contact the attending provider between 7A-7P or the covering provider during after hours 7P-7A, please log into the web site [email or web address] and access using universal [place] password for that web site. If you do not have the password, please call the hospital operator.  02/17/2021, 10:43 AM

## 2021-02-17 NOTE — Evaluation (Signed)
Physical Therapy Evaluation Patient Details Name: Jill Jenkins MRN: [id number] DOB: 1966/04/03 Today's Date: 02/17/2021   History of Present Illness  Patient is a 55 y/o female who presents on 02/15/21 with chest pain, SOB and witnessed syncopal episode with + LOC and hand shaking. EEG unremarkable. 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 MRI pending. Workup pending. PMH includes migraines.  Clinical Impression  Patient presents with lightheadedness, chest discomfort, dyspnea on exertion, decreased activity tolerance and impaired mobility s/p above. Pt lives at home with spouse and children and is independent for ADLs.Jill Jenkins and does yoga. Today, pt tolerated short distance ambulation in room with Min guard and furniture walking for support. 2-3/4 DOE noted with short distance ambulation and lightheadedness in standing for more than 30 seconds. Sitting BP 119/74, standing BP 101/74 with lightheadedness. HR ranging from 83-92 bpm throughout. Plan for MRI this morning, Likely once symptoms resolve, pt will return to PLOF. Will follow acutely to maximize independence and mobility and continue further assessment to determine discharge needs.    Follow Up Recommendations No PT follow up;Supervision - Intermittent    Equipment Recommendations  None recommended by PT    Recommendations for Other Services       Precautions / Restrictions Precautions Precautions: Fall;Other (comment) Precaution Comments: lightheadedness, watch BP Restrictions Weight Bearing Restrictions: No      Mobility  Bed Mobility               General bed mobility comments: Walking out of bathroom upon arrival.    Transfers Overall transfer level: Needs assistance Equipment used: None Transfers: Sit to/from Stand Sit to Stand: Min guard         General transfer comment: Min guard for transfers, sat in chair post ambulation. Min A at  times in standing due to lightheadedness and instability with eyes closing.  Ambulation/Gait Ambulation/Gait assistance: Min guard Gait Distance (Feet): 22 Feet Assistive device: None Gait Pattern/deviations: Step-through pattern;Decreased stride length Gait velocity: decreased Gait velocity interpretation: <1.31 ft/sec, indicative of household ambulator General Gait Details: Slow, guarded and mildly unsteady gait reaching for furniture for support. 2-3/4 DOE with chest discomfort.  Stairs            Wheelchair Mobility    Modified Rankin (Stroke Patients Only)       Balance Overall balance assessment: Needs assistance Sitting-balance support: Feet supported;No upper extremity supported Sitting balance-Leahy Scale: Good     Standing balance support: During functional activity Standing balance-Leahy Scale: Fair Standing balance comment: Able to stand at sink and brush teeth leaning on counter for support.                             Pertinent Vitals/Pain Pain Assessment: Faces Faces Pain Scale: Hurts little more Pain Location: chest pain central with breathing Pain Descriptors / Indicators: Sharp;Grimacing;Discomfort Pain Intervention(s): Monitored during session;Limited activity within patient's tolerance    Home Living Family/patient expects to be discharged to:: Private residence Living Arrangements: Spouse/significant other;Children Available Help at Discharge: Family;Available PRN/intermittently (spouse works) Type of Home: House Home Access: Stairs to enter Entrance Stairs-Rails: None Technical brewer of Steps: 2 Home Layout: Two level;Able to live on main level with bedroom/bathroom Home Equipment: None      Prior Function Level of Independence: Independent         Comments: Drives, does yoga. Does  not work. Independent  with ADLs/IADLs.     Hand Dominance   Dominant Hand: Right    Extremity/Trunk Assessment   Upper  Extremity Assessment Upper Extremity Assessment: Defer to OT evaluation    Lower Extremity Assessment Lower Extremity Assessment: Generalized weakness (but functional)    Cervical / Trunk Assessment Cervical / Trunk Assessment: Normal  Communication   Communication: No difficulties  Cognition Arousal/Alertness: Awake/alert Behavior During Therapy: WFL for tasks assessed/performed Overall Cognitive Status: Within Functional Limits for tasks assessed                                        General Comments General comments (skin integrity, edema, etc.): Sitting BP 119/74, standing BP 101/74 with lightheadedness. HR ranging from 83-92 bpm.    Exercises     Assessment/Plan    PT Assessment Patient needs continued PT services  PT Problem List Decreased strength;Decreased mobility;Pain;Decreased balance;Decreased activity tolerance;Cardiopulmonary status limiting activity       PT Treatment Interventions Therapeutic exercise;Gait training;Stair training;Functional mobility training;Therapeutic activities;Patient/family education;Balance training    PT Goals (Current goals can be found in the Care Plan section)  Acute Rehab PT Goals Patient Stated Goal: to get better and go home PT Goal Formulation: With patient Time For Goal Achievement: 03/03/21 Potential to Achieve Goals: Good    Frequency Min 3X/week   Barriers to discharge Inaccessible home environment stairs    Co-evaluation               AM-PAC PT "6 Clicks" Mobility  Outcome Measure Help needed turning from your back to your side while in a flat bed without using bedrails?: None Help needed moving from lying on your back to sitting on the side of a flat bed without using bedrails?: None Help needed moving to and from a bed to a chair (including a wheelchair)?: A Little Help needed standing up from a chair using your arms (e.g., wheelchair or bedside chair)?: A Little Help needed to walk in  hospital room?: A Little Help needed climbing 3-5 steps with a railing? : A Little 6 Click Score: 20    End of Session   Activity Tolerance: Patient limited by pain;Other (comment) (lightheadedness) Patient left: in chair;with call bell/phone within reach;with family/visitor present Nurse Communication: Mobility status PT Visit Diagnosis: Pain;Unsteadiness on feet (R26.81);Difficulty in walking, not elsewhere classified (R26.2) Pain - part of body:  (chest)    Time: 7858-8502 PT Time Calculation (min) (ACUTE ONLY): 15 min   Charges:   PT Evaluation $PT Eval Moderate Complexity: 1 Mod          Marisa Severin, PT, DPT Acute Rehabilitation Services Pager [phone] Office [place] 02/17/2021, 10:08 AM

## 2021-02-17 NOTE — Progress Notes (Signed)
Progress Note  Patient Name: Jill Jenkins Date of Encounter: 02/17/2021  [facility] Cardiologist: New to Dr Oval Linsey   Subjective   Patient states she had a lot of sharp chest pain last night, it's worsened with breathing and laying flat. She said colchicine seems helped the pain briefly yesterday but she had only received 2 dose so far and has not received the dose today. She had her MRI today. She states she did not sleep well due to chest pain and SOB last night.   Inpatient Medications    Scheduled Meds: . colchicine  0.6 mg Oral Daily  . enoxaparin (LOVENOX) injection  40 mg Subcutaneous Q24H  . ibuprofen  800 mg Oral Q8H  . pantoprazole  40 mg Oral Daily  . senna  1 tablet Oral Daily  . sodium chloride flush  3 mL Intravenous Q12H  . topiramate  50 mg Oral BID   Continuous Infusions:  PRN Meds: acetaminophen, eletriptan, melatonin, morphine injection, ondansetron (ZOFRAN) IV   Vital Signs    Vitals:   02/16/21 1940 02/16/21 2330 02/17/21 0210 02/17/21 0423  BP: 100/73 107/74  114/79  Pulse: 77 70  75  Resp: 20 (!) 23 (!) 21 18  Temp: 98.4 F (36.9 C) 98.7 F (37.1 C)  98.4 F (36.9 C)  TempSrc: Oral Oral  Oral  SpO2: 97% 98%  97%  Weight:   91.5 kg     Intake/Output Summary (Last 24 hours) at 02/17/2021 1117 Last data filed at 02/16/2021 2200 Gross per 24 hour  Intake 1506.43 ml  Output --  Net 1506.43 ml   Last 3 Weights 02/17/2021 02/16/2021 01/09/2021  Weight (lbs) 201 lb 11.2 oz 179 lb 14.3 oz 205 lb  Weight (kg) 91.491 kg 81.6 kg 92.987 kg      Telemetry    Sinus rhythm with rate of 70s - Personally Reviewed  ECG    New new tracing today - Personally Reviewed  Physical Exam   GEN: No acute distress.   Neck: No JVD Cardiac: RRR, no murmurs, rubs, or gallops. No friction rub noted  Respiratory: Clear to auscultation bilaterally. On room air.  GI: Soft, nontender, non-distended  MS: Trace BLE edema; No deformity. Neuro:  Alert and oriented  x3, follow commands appropriately  Psych: Normal affect   Labs    High Sensitivity Troponin:   Recent Labs  Lab 02/15/21 1502 02/15/21 1920 02/16/21 0200  TROPONINIHS $RemoveBefo'3 4 4      'haUDBZjLcCR$ Chemistry Recent Labs  Lab 02/15/21 1502 02/15/21 1951 02/16/21 0200 02/16/21 1052  NA 140  --  138  --   K 3.9  --  3.7  --   CL 110  --  109  --   CO2 19*  --  20*  --   GLUCOSE 100*  --  131*  --   BUN 15  --  12  --   CREATININE 1.04*  --  0.93 1.04*  CALCIUM 9.7  --  8.9  --   PROT  --  6.8  --   --   ALBUMIN  --  3.9  --   --   AST  --  24  --   --   ALT  --  38  --   --   ALKPHOS  --  39  --   --   BILITOT  --  0.7  --   --   GFRNONAA >60  --  >60 >60  ANIONGAP 11  --  9  --      Hematology Recent Labs  Lab 02/15/21 1502 02/16/21 0200 02/16/21 1052  WBC 11.5* 13.1* 10.5  RBC 4.76 4.31 4.35  HGB 15.0 13.7 14.0  HCT 46.3* 41.7 41.5  MCV 97.3 96.8 95.4  MCH 31.5 31.8 32.2  MCHC 32.4 32.9 33.7  RDW 12.8 13.0 13.0  PLT 204 186 170    BNPNo results for input(s): BNP, PROBNP in the last 168 hours.   DDimer  Recent Labs  Lab 02/15/21 1951  DDIMER 0.62*     Radiology    DG Chest 2 View  Result Date: 02/15/2021 CLINICAL DATA:  Chest pain and shortness of breath. EXAM: CHEST - 2 VIEW COMPARISON:  None. FINDINGS: The heart size and mediastinal contours are within normal limits. Both lungs are clear. The visualized skeletal structures are unremarkable. IMPRESSION: No active cardiopulmonary disease. Electronically Signed   By: Virgina Norfolk M.D.   On: 02/15/2021 15:17   CT Angio Chest PE W and/or Wo Contrast  Result Date: 02/16/2021 CLINICAL DATA:  sharp cp. Patient stated that the pain started after putting sheets on the bed. PE suspected, low/intermediate prob, positive D-dimer EXAM: CT ANGIOGRAPHY CHEST WITH CONTRAST TECHNIQUE: Multidetector CT imaging of the chest was performed using the standard protocol during bolus administration of intravenous contrast. Multiplanar  CT image reconstructions and MIPs were obtained to evaluate the vascular anatomy. CONTRAST:  69mL OMNIPAQUE IOHEXOL 350 MG/ML SOLN COMPARISON:  None. FINDINGS: Cardiovascular: Satisfactory opacification of the pulmonary arteries to the segmental level. No evidence of pulmonary embolism. Normal heart size. No significant pericardial effusion. The thoracic aorta is normal in caliber. No atherosclerotic plaque of the thoracic aorta. No definite coronary artery calcifications. 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 No enlarged mediastinal, hilar, or axillary lymph nodes. Thyroid gland, trachea, and esophagus demonstrate no significant findings. Lungs/Pleura: Bilateral lower lobe subsegmental atelectasis. No focal consolidation. No pulmonary nodule. No pulmonary mass. No pleural effusion. No pneumothorax Upper Abdomen: Multiple fluid density lesions within the liver likely represent hepatic cysts with the largest in the left hepatic lobe measuring up to 3.8 cm. Musculoskeletal: No chest wall abnormality.  Bilateral breast implants. No suspicious lytic or blastic osseous lesions. No acute displaced fracture. Multilevel degenerative changes of the spine. Review of the MIP images confirms the above findings. IMPRESSION: 1. No pulmonary embolus. 2. No acute intrathoracic abnormality. 3. 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 4. Multiple fluid density lesions within the liver statistically likely represent hepatic cysts. Electronically Signed   By: Iven Finn M.D.   On: 02/16/2021 00:08   EEG adult  Result Date: 02/16/2021 Lora Havens, MD     02/16/2021  4:12 PM Patient Name: Jill Jenkins MRN: [id number] Epilepsy Attending: Lora Havens Referring Physician/Provider: Dr Irene Pap Date: 02/16/2021 Duration: 22.34 mins Patient history: 55yo F with syncope. EEG to evaluate for seizure Level of  alertness: Awake, asleep AEDs during EEG study: TPM Technical aspects: This EEG study was done with scalp electrodes positioned according to the 10-20 International system of electrode placement. Electrical activity was acquired at a sampling rate of $Remov'500Hz'RpEccU$  and reviewed with a high frequency filter of $RemoveB'70Hz'JWyJTuhg$  and a low frequency filter of $RemoveB'1Hz'HXmLfcry$ . EEG data were recorded continuously and digitally stored. Description: The posterior dominant rhythm consists of 9 Hz activity of moderate voltage (25-35 uV) seen predominantly  in posterior head regions, symmetric and reactive to eye opening and eye closing. Sleep was characterized by vertex waves, sleep spindles (12 to 14 Hz), maximal frontocentral region. Physiologic photic driving was seen during photic stimulation.  Hyperventilation was not performed.   IMPRESSION: This study is within normal limits. No seizures or epileptiform discharges were seen throughout the recording. Lora Havens   ECHOCARDIOGRAM COMPLETE  Result Date: 02/16/2021    ECHOCARDIOGRAM REPORT   Patient Name:   Bea P Quattrone Date of Exam: 02/16/2021 Medical Rec #:  [id number]      Height:       70.0 in Accession #:    [id number]     Weight:       179.9 lb Date of Birth:  1966/07/15      BSA:          1.995 m Patient Age:    60 years       BP:           120/84 mmHg Patient Gender: F              HR:           83 bpm. Exam Location:  Inpatient Procedure: 2D Echo, Cardiac Doppler and Color Doppler Indications:    Syncope R55  History:        Patient has no prior history of Echocardiogram examinations.                 Signs/Symptoms:Chest Pain and Syncope. Acute idiopathic                 pericarditis, Pericardial cyst.  Sonographer:    Alvino Chapel RCS Referring Phys: [id number] [place]  1. Left ventricular ejection fraction, by estimation, is 60 to 65%. The left ventricle has normal function. The left ventricle has no regional wall motion abnormalities. Left ventricular diastolic parameters  were normal.  2. Right ventricular systolic function is normal. The right ventricular size is normal. There is normal pulmonary artery systolic pressure.  3. The mitral valve is normal in structure. No evidence of mitral valve regurgitation. No evidence of mitral stenosis.  4. The aortic valve is tricuspid. Aortic valve regurgitation is not visualized. No aortic stenosis is present.  5. The inferior vena cava is normal in size with greater than 50% respiratory variability, suggesting right atrial pressure of 3 mmHg. FINDINGS  Left Ventricle: Left ventricular ejection fraction, by estimation, is 60 to 65%. The left ventricle has normal function. The left ventricle has no regional wall motion abnormalities. The left ventricular internal cavity size was normal in size. There is  no left ventricular hypertrophy. Left ventricular diastolic parameters were normal. Normal left ventricular filling pressure. Right Ventricle: The right ventricular size is normal. No increase in right ventricular wall thickness. Right ventricular systolic function is normal. There is normal pulmonary artery systolic pressure. The tricuspid regurgitant velocity is 2.14 m/s, and  with an assumed right atrial pressure of 3 mmHg, the estimated right ventricular systolic pressure is 06.3 mmHg. Left Atrium: Left atrial size was normal in size. Right Atrium: Right atrial size was normal in size. Pericardium: There is no evidence of pericardial effusion. Mitral Valve: The mitral valve is normal in structure. No evidence of mitral valve regurgitation. No evidence of mitral valve stenosis. Tricuspid Valve: The tricuspid valve is normal in structure. Tricuspid valve regurgitation is mild . No evidence of tricuspid stenosis. Aortic Valve: The aortic valve is tricuspid. Aortic valve regurgitation  is not visualized. No aortic stenosis is present. Pulmonic Valve: The pulmonic valve was normal in structure. Pulmonic valve regurgitation is not visualized. No  evidence of pulmonic stenosis. Aorta: The aortic root is normal in size and structure. Venous: The inferior vena cava is normal in size with greater than 50% respiratory variability, suggesting right atrial pressure of 3 mmHg. IAS/Shunts: No atrial level shunt detected by color flow Doppler.  LEFT VENTRICLE PLAX 2D LVIDd:         4.50 cm  Diastology LVIDs:         2.40 cm  LV e' medial:    8.70 cm/s LV PW:         0.90 cm  LV E/e' medial:  8.8 LV IVS:        0.90 cm  LV e' lateral:   11.00 cm/s LVOT diam:     2.00 cm  LV E/e' lateral: 7.0 LV SV:         84 LV SV Index:   42 LVOT Area:     3.14 cm  RIGHT VENTRICLE RV S prime:     14.10 cm/s TAPSE (M-mode): 2.0 cm LEFT ATRIUM             Index       RIGHT ATRIUM           Index LA diam:        3.00 cm 1.50 cm/m  RA Area:     19.40 cm LA Vol (A2C):   39.9 ml 20.00 ml/m RA Volume:   56.70 ml  28.42 ml/m LA Vol (A4C):   42.8 ml 21.45 ml/m LA Biplane Vol: 43.3 ml 21.70 ml/m  AORTIC VALVE LVOT Vmax:   117.00 cm/s LVOT Vmean:  79.000 cm/s LVOT VTI:    0.266 m  AORTA Ao Root diam: 3.40 cm MITRAL VALVE               TRICUSPID VALVE MV Area (PHT): 3.91 cm    TR Peak grad:   18.3 mmHg MV Decel Time: 194 msec    TR Vmax:        214.00 cm/s MV E velocity: 76.70 cm/s MV A velocity: 66.40 cm/s  SHUNTS MV E/A ratio:  1.16        Systemic VTI:  0.27 m                            Systemic Diam: 2.00 cm Dani Gobble Croitoru MD Electronically signed by Sanda Klein MD Signature Date/Time: 02/16/2021/3:06:02 PM    Final    VAS [place] LOWER EXTREMITY VENOUS (DVT)  Result Date: 02/16/2021  Lower Venous DVT Study Patient Name:  Ahniya P Linderman  Date of Exam:   02/16/2021 Medical Rec #: [id number]       Accession #:    [id number] Date of Birth: [birth year]       Patient Gender: F Patient Age:   9Y Exam Location:  [facility] Procedure:      VAS [place] LOWER EXTREMITY VENOUS (DVT) Referring Phys: [id number] [place]  --------------------------------------------------------------------------------  Indications: Elevated Ddimer.  Risk Factors: None identified. Comparison Study: No prior studies. Performing Technologist: Oliver Hum RVT  Examination Guidelines: A complete evaluation includes B-mode imaging, spectral Doppler, color Doppler, and power Doppler as needed of all accessible portions of each vessel. Bilateral testing is considered an integral part of a complete examination. Limited examinations for reoccurring indications may be  performed as noted. The reflux portion of the exam is performed with the patient in reverse Trendelenburg.  +---------+---------------+---------+-----------+----------+--------------+ RIGHT    CompressibilityPhasicitySpontaneityPropertiesThrombus Aging +---------+---------------+---------+-----------+----------+--------------+ CFV      Full           Yes      Yes                                 +---------+---------------+---------+-----------+----------+--------------+ SFJ      Full                                                        +---------+---------------+---------+-----------+----------+--------------+ FV Prox  Full                                                        +---------+---------------+---------+-----------+----------+--------------+ FV Mid   Full                                                        +---------+---------------+---------+-----------+----------+--------------+ FV DistalFull                                                        +---------+---------------+---------+-----------+----------+--------------+ PFV      Full                                                        +---------+---------------+---------+-----------+----------+--------------+ POP      Full           Yes      Yes                                 +---------+---------------+---------+-----------+----------+--------------+ PTV      Full                                                         +---------+---------------+---------+-----------+----------+--------------+ PERO     Full                                                        +---------+---------------+---------+-----------+----------+--------------+   +---------+---------------+---------+-----------+----------+--------------+ LEFT     CompressibilityPhasicitySpontaneityPropertiesThrombus Aging +---------+---------------+---------+-----------+----------+--------------+ CFV      Full           Yes  Yes                                 +---------+---------------+---------+-----------+----------+--------------+ SFJ      Full                                                        +---------+---------------+---------+-----------+----------+--------------+ FV Prox  Full                                                        +---------+---------------+---------+-----------+----------+--------------+ FV Mid   Full                                                        +---------+---------------+---------+-----------+----------+--------------+ FV DistalFull                                                        +---------+---------------+---------+-----------+----------+--------------+ PFV      Full                                                        +---------+---------------+---------+-----------+----------+--------------+ POP      Full           Yes      Yes                                 +---------+---------------+---------+-----------+----------+--------------+ PTV      Full                                                        +---------+---------------+---------+-----------+----------+--------------+ PERO     Full                                                        +---------+---------------+---------+-----------+----------+--------------+     Summary: RIGHT: - There is no evidence of deep vein  thrombosis in the lower extremity.  - No cystic structure found in the popliteal fossa.  LEFT: - There is no evidence of deep vein thrombosis in the lower extremity.  - No cystic structure found in the popliteal fossa.  *See table(s) above for measurements and observations. Electronically signed by Ruta Hinds MD on 02/16/2021 at 5:08:43 PM.    Final  Cardiac Studies   Echo from 02/16/21:  1. Left ventricular ejection fraction, by estimation, is 60 to 65%. The  left ventricle has normal function. The left ventricle has no regional  wall motion abnormalities. Left ventricular diastolic parameters were  normal.  2. Right ventricular systolic function is normal. The right ventricular  size is normal. There is normal pulmonary artery systolic pressure.  3. The mitral valve is normal in structure. No evidence of mitral valve  regurgitation. No evidence of mitral stenosis.  4. The aortic valve is tricuspid. Aortic valve regurgitation is not  visualized. No aortic stenosis is present.  5. The inferior vena cava is normal in size with greater than 50%  respiratory variability, suggesting right atrial pressure of 3 mmHg   Patient Profile     55 y.o. female with PMH of migraine, insomnia, no known cardiac history, cardiology is consulted for chest pain /syncope and pericardial cyst.   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  - Echo as above - Cardiac MRI pending   Syncope  - presented with dizziness and chest pain prior to syncope event - Echo as above - EEG negative for seizures or epileptiform discharges  - telemetry monitor without acute events noted  - cardiac MRI pending   Leukocytosis, resolved  - WBC normalized today  - managed per primary team   Hepatic cysts - managed per primary team   Migraine - managed per primary team     For questions or updates, please contact [facility] Please consult [email or web address] for contact info under        Signed, Margie Billet, NP  02/17/2021, 11:17 AM

## 2021-02-17 NOTE — Progress Notes (Signed)
Occupational Therapy Evaluation/Discharge  PTA, pt lives with spouse and one college-aged child that is home for summer break. Pt reports Independence in all ADLs, IADLs and mobility. Pt presents now with chest pain interfering with daily tasks and continued medical work-up ongoing. Pt able to demo Independent transfers and has been going to bathroom Independently at hospital. Assessed orthostatic vitals, which were negative. Pt reports chest pain has improved since last night though still present, denies any lightheadedness with standing activities during session. Anticipate once symptoms resolve, pt will quickly return to normal activities with no further OT services needed.  BP lying: 94/68 (79) BP sitting: 123/75 (90) BP standing: 115/84 (95)   02/17/21 1200  OT Visit Information  Last OT Received On 02/17/21  Assistance Needed +1  History of Present Illness Patient is a 55 y/o female who presents on 02/15/21 with chest pain, SOB and witnessed syncopal episode with + LOC and hand shaking. EEG unremarkable. 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 MRI pending. Workup pending. PMH includes migraines.  Precautions  Precautions Other (comment)  Precaution Comments lightheadedness, watch BP  Restrictions  Weight Bearing Restrictions No  Home Living  Family/patient expects to be discharged to: Private residence  Living Arrangements Spouse/significant other;Children  Available Help at Discharge Family;Available PRN/intermittently  Type of Home House  Home Access Stairs to enter  Entrance Stairs-Number of Steps 2  Entrance Stairs-Rails None  Home Layout Two level;Able to live on main level with bedroom/bathroom  Alternate Level Stairs-Rails Right  Bathroom Shower/Tub Tub/shower unit;Walk-in Engineer, materials None  Prior Function  Level of Independence Independent  Comments Drives, does  yoga. Does  not work. Independent with ADLs/IADLs.  Communication  Communication No difficulties  Pain Assessment  Pain Assessment Faces  Faces Pain Scale 4  Pain Location chest pain central with breathing  Pain Descriptors / Indicators Discomfort;Grimacing  Pain Intervention(s) Monitored during session;Other (comment) (rn aware)  Cognition  Arousal/Alertness Awake/alert  Behavior During Therapy WFL for tasks assessed/performed  Overall Cognitive Status Within Functional Limits for tasks assessed  Upper Extremity Assessment  Upper Extremity Assessment Overall WFL for tasks assessed  Lower Extremity Assessment  Lower Extremity Assessment Overall WFL for tasks assessed  Cervical / Trunk Assessment  Cervical / Trunk Assessment Normal  ADL  Overall ADL's  Independent  Vision- History  Patient Visual Report No change from baseline  Vision- Assessment  Vision Assessment? No apparent visual deficits  Bed Mobility  Overal bed mobility Independent  Transfers  Overall transfer level Independent  Equipment used None  Transfers Sit to/from Stand  Sit to Stand Independent  General transfer comment no assist needed  Balance  Overall balance assessment Needs assistance  Sitting-balance support Feet supported;No upper extremity supported  Sitting balance-Leahy Scale Good  Standing balance support During functional activity  Standing balance-Leahy Scale Good  OT - End of Session  Activity Tolerance Other (comment);Patient limited by fatigue (limited by chest discomfort)  Patient left in bed;with call bell/phone within reach;with family/visitor present  Nurse Communication Mobility status  OT Assessment  OT Recommendation/Assessment Patient does not need any further OT services  OT Visit Diagnosis Pain  Pain - part of body  (chest)  OT Problem List Pain  AM-PAC OT "6 Clicks" Daily Activity Outcome Measure (Version 2)  Help from another person eating meals? 4  Help from another  person taking care of personal grooming? 4  Help from another person toileting, which includes using toliet, bedpan, or urinal? 4  Help from another person bathing (including washing, rinsing, drying)? 4  Help from another person to put on and taking off regular upper body clothing? 4  Help from another person to put on and taking off regular lower body clothing? 4  6 Click Score 24  OT Recommendation  Follow Up Recommendations No OT follow up  OT Equipment None recommended by OT  Acute Rehab OT Goals  Patient Stated Goal to get better and go home  OT Goal Formulation All assessment and education complete, DC therapy  OT Time Calculation  OT Start Time (ACUTE ONLY) 1150  OT Stop Time (ACUTE ONLY) 1202  OT Time Calculation (min) 12 min  OT General Charges  $OT Visit 1 Visit  OT Evaluation  $OT Eval Low Complexity 1 Low  Written Expression  Dominant Hand Right

## 2021-02-17 NOTE — Progress Notes (Signed)
Pt had SVT, 14 beat run per CCMD. Notified cardiology in person.

## 2021-02-18 DIAGNOSIS — I319 Disease of pericardium, unspecified: Secondary | ICD-10-CM | POA: Diagnosis present

## 2021-02-18 DIAGNOSIS — I318 Other specified diseases of pericardium: Secondary | ICD-10-CM

## 2021-02-18 LAB — GLUCOSE, CAPILLARY: Glucose-Capillary: 110 mg/dL — ABNORMAL HIGH (ref 70–99)

## 2021-02-18 MED ORDER — IBUPROFEN 200 MG PO TABS
800.0000 mg | ORAL_TABLET | Freq: Four times a day (QID) | ORAL | Status: DC
  Administered 2021-02-18 – 2021-02-20 (×8): 800 mg via ORAL
  Filled 2021-02-18 (×8): qty 1

## 2021-02-18 MED ORDER — TOPIRAMATE 25 MG PO TABS
25.0000 mg | ORAL_TABLET | Freq: Two times a day (BID) | ORAL | Status: DC
  Administered 2021-02-18 – 2021-02-21 (×6): 25 mg via ORAL
  Filled 2021-02-18 (×7): qty 1

## 2021-02-18 MED ORDER — PANTOPRAZOLE SODIUM 40 MG PO TBEC
40.0000 mg | DELAYED_RELEASE_TABLET | Freq: Two times a day (BID) | ORAL | Status: DC
  Administered 2021-02-18 – 2021-02-20 (×5): 40 mg via ORAL
  Filled 2021-02-18 (×5): qty 1

## 2021-02-18 NOTE — Progress Notes (Signed)
Progress Note  Patient Name: Jill Jenkins Date of Encounter: 02/18/2021  [facility] Cardiologist: Skeet Latch, MD (new)  Subjective   Feeling better today.  She continues to have a 2 out of 10 dull chest pain that is worsened with inspiration.  She was able to get some sleep last night.  Inpatient Medications    Scheduled Meds: . colchicine  0.6 mg Oral Daily  . enoxaparin (LOVENOX) injection  40 mg Subcutaneous Q24H  . ibuprofen  800 mg Oral QID  . pantoprazole  40 mg Oral BID  . polyethylene glycol  17 g Oral Daily  . senna-docusate  2 tablet Oral BID  . sodium chloride flush  3 mL Intravenous Q12H  . topiramate  50 mg Oral BID   Continuous Infusions:  PRN Meds: acetaminophen, eletriptan, ketorolac, melatonin, morphine injection, ondansetron (ZOFRAN) IV   Vital Signs    Vitals:   02/17/21 1200 02/17/21 2014 02/18/21 0540 02/18/21 0700  BP: 115/84 121/79 109/66 (!) 104/59  Pulse:  74 66 67  Resp:  18 17 (!) 22  Temp:  97.7 F (36.5 C) 98.4 F (36.9 C) 98.3 F (36.8 C)  TempSrc:  Oral Oral Oral  SpO2:  99% 97%   Weight:   91.6 kg     Intake/Output Summary (Last 24 hours) at 02/18/2021 1016 Last data filed at 02/18/2021 0813 Gross per 24 hour  Intake 703 ml  Output --  Net 703 ml   Last 3 Weights 02/18/2021 02/17/2021 02/16/2021  Weight (lbs) 201 lb 15.1 oz 201 lb 11.2 oz 179 lb 14.3 oz  Weight (kg) 91.6 kg 91.491 kg 81.6 kg      Telemetry    Sinus rhythm.  50 beats SVT.- Personally Reviewed  ECG    N/A- Personally Reviewed  Physical Exam   VS:  BP (!) 104/59 (BP Location: Right Arm)   Pulse 67   Temp 98.3 F (36.8 C) (Oral)   Resp (!) 22   Wt 91.6 kg   SpO2 97%   BMI 28.98 kg/m  , BMI Body mass index is 28.98 kg/m. GENERAL:  Well appearing HEENT: Pupils equal round and reactive, fundi not visualized, oral mucosa unremarkable NECK:  No jugular venous distention, waveform within normal limits, carotid upstroke brisk and symmetric, no  bruits LUNGS:  Clear to auscultation bilaterally HEART:  RRR.  PMI not displaced or sustained,S1 and S2 within normal limits, no S3, no S4, no clicks, no rubs, no murmurs ABD:  Flat, positive bowel sounds normal in frequency in pitch, no bruits, no rebound, no guarding, no midline pulsatile mass, no hepatomegaly, no splenomegaly EXT:  2 plus pulses throughout, no edema, no cyanosis no clubbing SKIN:  No rashes no nodules NEURO:  Cranial nerves II through XII grossly intact, motor grossly intact throughout PSYCH:  Cognitively intact, oriented to person place and time   Labs    High Sensitivity Troponin:   Recent Labs  Lab 02/15/21 1502 02/15/21 1920 02/16/21 0200  TROPONINIHS 3 4 4       Chemistry Recent Labs  Lab 02/15/21 1502 02/15/21 1951 02/16/21 0200 02/16/21 1052  NA 140  --  138  --   K 3.9  --  3.7  --   CL 110  --  109  --   CO2 19*  --  20*  --   GLUCOSE 100*  --  131*  --   BUN 15  --  12  --   CREATININE 1.04*  --  0.93 1.04*  CALCIUM 9.7  --  8.9  --   PROT  --  6.8  --   --   ALBUMIN  --  3.9  --   --   AST  --  24  --   --   ALT  --  38  --   --   ALKPHOS  --  39  --   --   BILITOT  --  0.7  --   --   GFRNONAA >60  --  >60 >60  ANIONGAP 11  --  9  --      Hematology Recent Labs  Lab 02/15/21 1502 02/16/21 0200 02/16/21 1052  WBC 11.5* 13.1* 10.5  RBC 4.76 4.31 4.35  HGB 15.0 13.7 14.0  HCT 46.3* 41.7 41.5  MCV 97.3 96.8 95.4  MCH 31.5 31.8 32.2  MCHC 32.4 32.9 33.7  RDW 12.8 13.0 13.0  PLT 204 186 170    BNPNo results for input(s): BNP, PROBNP in the last 168 hours.   DDimer  Recent Labs  Lab 02/15/21 1951  DDIMER 0.62*     Radiology    MR CARDIAC MORPHOLOGY W WO CONTRAST  Result Date: 02/17/2021 CLINICAL DATA:  Suspected pericardial cyst EXAM: CARDIAC MRI TECHNIQUE: The patient was scanned on a 1.5 Tesla Siemens magnet. A dedicated cardiac coil was used. Functional imaging was done using Fiesta sequences. 2,3, and 4 chamber  views were done to assess for RWMA's. Modified Simpson's rule using a short axis stack was used to calculate an ejection fraction on a dedicated work Conservation officer, nature. The patient received 12 cc of Gadavist. After 10 minutes inversion recovery sequences were used to assess for infiltration and scar tissue. CONTRAST:  12 cc  of Gadavist FINDINGS: Left ventricle: -Normal size -Normal wall thickness -Normal systolic function -Normal ECV (25%) -No LGE LV EF:  67% (Normal 56-78%) Absolute volumes: LV EDV: 142mL (Normal 52-141 mL) LV ESV: 86mL (Normal 13-51 mL) LV SV: 60mL (Normal 33-97 mL) CO: 4.7L/min (Normal 2.7-6.0 L/min) Indexed volumes: LV EDV: 50mL/sq-m (Normal 41-81 mL/sq-m) LV ESV: 60mL/sq-m (Normal 12-21 mL/sq-m) LV SV: 59mL/sq-m (Normal 26-56 mL/sq-m) CI: 2.2L/min/sq-m (Normal 1.8-3.8 L/min/sq-m) Right ventricle: Normal size and systolic function RV EF: 32% (Normal 47-80%) Absolute volumes: RV EDV: 146mL (Normal 58-154 mL) RV ESV: 30mL (Normal 12-68 mL) RV SV: 7mL (Normal 35-98 mL) CO: 4.6L/min (Normal 2.7-6 L/min) Indexed volumes: RV EDV: 65mL/sq-m (Normal 48-87 mL/sq-m) RV ESV: 77mL/sq-m (Normal 11-28 mL/sq-m) RV SV: 65mL/sq-m (Normal 27-57 mL/sq-m) CI: 2.2L/min/sq-m (Normal 1.8-3.8 L/min/sq-m) Left atrium: Normal size Right atrium: Normal size Mitral valve: No regurgitation Aortic valve: No regurgitation Tricuspid valve: Trivial regurgitation Pulmonic valve: No regurgitation Aorta: Dilated ascending aorta measuring 39mm Pericardium: Small effusion measuring up to 11mm adjacent to LV lateral wall. There is a 55mm x 67mm x 71mm mass adjacent to the right atrium. Mass is hypointense to myocardium on T1 weighted imaging, hyperintense on T2 weighted imaging, does not suppress with fat saturation, no contrast uptake on first pass perfusion, and no late gaolinium enhancement. This is consistent with a pericardial cyst. IMPRESSION: 1. There is a 53mm x 20mm x 22mm mass adjacent to the right atrium. Mass  is hypointense to myocardium on T1 weighted imaging, hyperintense on T2 weighted imaging, does not suppress with fat saturation, no contrast uptake on first pass perfusion, and no late gadolinium enhancement. This is consistent with a pericardial cyst. 2. Pericardial LGE and hyperenhancement on T2 weighted imaging suggests pericardial  inflammation, consistent with acute pericarditis 3. Small pericardial effusion measuring up to 89mm adjacent to LV lateral wall 4. Normal LV size, wall thickness, and systolic function (EF 88%). No LGE to suggest myocardial scar 5.  Normal RV size and systolic function (EF 41%) 6.  Dilated ascending aorta measuring 10mm Electronically Signed   By: Oswaldo Milian MD   On: 02/17/2021 21:26   EEG adult  Result Date: 02/16/2021 Lora Havens, MD     02/16/2021  4:12 PM Patient Name: CIGI BEGA MRN: [id number] Epilepsy Attending: Lora Havens Referring Physician/Provider: Dr Irene Pap Date: 02/16/2021 Duration: 22.34 mins Patient history: 55yo F with syncope. EEG to evaluate for seizure Level of alertness: Awake, asleep AEDs during EEG study: TPM Technical aspects: This EEG study was done with scalp electrodes positioned according to the 10-20 International system of electrode placement. Electrical activity was acquired at a sampling rate of 500Hz  and reviewed with a high frequency filter of 70Hz  and a low frequency filter of 1Hz . EEG data were recorded continuously and digitally stored. Description: The posterior dominant rhythm consists of 9 Hz activity of moderate voltage (25-35 uV) seen predominantly in posterior head regions, symmetric and reactive to eye opening and eye closing. Sleep was characterized by vertex waves, sleep spindles (12 to 14 Hz), maximal frontocentral region. Physiologic photic driving was seen during photic stimulation.  Hyperventilation was not performed.   IMPRESSION: This study is within normal limits. No seizures or epileptiform discharges  were seen throughout the recording. Lora Havens   ECHOCARDIOGRAM COMPLETE  Result Date: 02/16/2021    ECHOCARDIOGRAM REPORT   Patient Name:   Arnie P Pion Date of Exam: 02/16/2021 Medical Rec #:  [id number]      Height:       70.0 in Accession #:    [id number]     Weight:       179.9 lb Date of Birth:  1965/12/08      BSA:          1.995 m Patient Age:    35 years       BP:           120/84 mmHg Patient Gender: F              HR:           83 bpm. Exam Location:  Inpatient Procedure: 2D Echo, Cardiac Doppler and Color Doppler Indications:    Syncope R55  History:        Patient has no prior history of Echocardiogram examinations.                 Signs/Symptoms:Chest Pain and Syncope. Acute idiopathic                 pericarditis, Pericardial cyst.  Sonographer:    Alvino Chapel RCS Referring Phys: [id number] [place]  1. Left ventricular ejection fraction, by estimation, is 60 to 65%. The left ventricle has normal function. The left ventricle has no regional wall motion abnormalities. Left ventricular diastolic parameters were normal.  2. Right ventricular systolic function is normal. The right ventricular size is normal. There is normal pulmonary artery systolic pressure.  3. The mitral valve is normal in structure. No evidence of mitral valve regurgitation. No evidence of mitral stenosis.  4. The aortic valve is tricuspid. Aortic valve regurgitation is not visualized. No aortic stenosis is present.  5. The inferior vena cava is normal in size with greater  than 50% respiratory variability, suggesting right atrial pressure of 3 mmHg. FINDINGS  Left Ventricle: Left ventricular ejection fraction, by estimation, is 60 to 65%. The left ventricle has normal function. The left ventricle has no regional wall motion abnormalities. The left ventricular internal cavity size was normal in size. There is  no left ventricular hypertrophy. Left ventricular diastolic parameters were normal. Normal left  ventricular filling pressure. Right Ventricle: The right ventricular size is normal. No increase in right ventricular wall thickness. Right ventricular systolic function is normal. There is normal pulmonary artery systolic pressure. The tricuspid regurgitant velocity is 2.14 m/s, and  with an assumed right atrial pressure of 3 mmHg, the estimated right ventricular systolic pressure is 98.9 mmHg. Left Atrium: Left atrial size was normal in size. Right Atrium: Right atrial size was normal in size. Pericardium: There is no evidence of pericardial effusion. Mitral Valve: The mitral valve is normal in structure. No evidence of mitral valve regurgitation. No evidence of mitral valve stenosis. Tricuspid Valve: The tricuspid valve is normal in structure. Tricuspid valve regurgitation is mild . No evidence of tricuspid stenosis. Aortic Valve: The aortic valve is tricuspid. Aortic valve regurgitation is not visualized. No aortic stenosis is present. Pulmonic Valve: The pulmonic valve was normal in structure. Pulmonic valve regurgitation is not visualized. No evidence of pulmonic stenosis. Aorta: The aortic root is normal in size and structure. Venous: The inferior vena cava is normal in size with greater than 50% respiratory variability, suggesting right atrial pressure of 3 mmHg. IAS/Shunts: No atrial level shunt detected by color flow Doppler.  LEFT VENTRICLE PLAX 2D LVIDd:         4.50 cm  Diastology LVIDs:         2.40 cm  LV e' medial:    8.70 cm/s LV PW:         0.90 cm  LV E/e' medial:  8.8 LV IVS:        0.90 cm  LV e' lateral:   11.00 cm/s LVOT diam:     2.00 cm  LV E/e' lateral: 7.0 LV SV:         84 LV SV Index:   42 LVOT Area:     3.14 cm  RIGHT VENTRICLE RV S prime:     14.10 cm/s TAPSE (M-mode): 2.0 cm LEFT ATRIUM             Index       RIGHT ATRIUM           Index LA diam:        3.00 cm 1.50 cm/m  RA Area:     19.40 cm LA Vol (A2C):   39.9 ml 20.00 ml/m RA Volume:   56.70 ml  28.42 ml/m LA Vol (A4C):    42.8 ml 21.45 ml/m LA Biplane Vol: 43.3 ml 21.70 ml/m  AORTIC VALVE LVOT Vmax:   117.00 cm/s LVOT Vmean:  79.000 cm/s LVOT VTI:    0.266 m  AORTA Ao Root diam: 3.40 cm MITRAL VALVE               TRICUSPID VALVE MV Area (PHT): 3.91 cm    TR Peak grad:   18.3 mmHg MV Decel Time: 194 msec    TR Vmax:        214.00 cm/s MV E velocity: 76.70 cm/s MV A velocity: 66.40 cm/s  SHUNTS MV E/A ratio:  1.16        Systemic VTI:  0.27 m  Systemic Diam: 2.00 cm Sanda Klein MD Electronically signed by Sanda Klein MD Signature Date/Time: 02/16/2021/3:06:02 PM    Final    VAS [place] LOWER EXTREMITY VENOUS (DVT)  Result Date: 02/16/2021  Lower Venous DVT Study Patient Name:  Terryn P Straughter  Date of Exam:   02/16/2021 Medical Rec #: [id number]       Accession #:    [id number] Date of Birth: May 02, 1966       Patient Gender: F Patient Age:   62Y Exam Location:  [facility] Procedure:      VAS [place] LOWER EXTREMITY VENOUS (DVT) Referring Phys: [id number] [place] --------------------------------------------------------------------------------  Indications: Elevated Ddimer.  Risk Factors: None identified. Comparison Study: No prior studies. Performing Technologist: Oliver Hum RVT  Examination Guidelines: A complete evaluation includes B-mode imaging, spectral Doppler, color Doppler, and power Doppler as needed of all accessible portions of each vessel. Bilateral testing is considered an integral part of a complete examination. Limited examinations for reoccurring indications may be performed as noted. The reflux portion of the exam is performed with the patient in reverse Trendelenburg.  +---------+---------------+---------+-----------+----------+--------------+ RIGHT    CompressibilityPhasicitySpontaneityPropertiesThrombus Aging +---------+---------------+---------+-----------+----------+--------------+ CFV      Full           Yes      Yes                                  +---------+---------------+---------+-----------+----------+--------------+ SFJ      Full                                                        +---------+---------------+---------+-----------+----------+--------------+ FV Prox  Full                                                        +---------+---------------+---------+-----------+----------+--------------+ FV Mid   Full                                                        +---------+---------------+---------+-----------+----------+--------------+ FV DistalFull                                                        +---------+---------------+---------+-----------+----------+--------------+ PFV      Full                                                        +---------+---------------+---------+-----------+----------+--------------+ POP      Full           Yes      Yes                                 +---------+---------------+---------+-----------+----------+--------------+  PTV      Full                                                        +---------+---------------+---------+-----------+----------+--------------+ PERO     Full                                                        +---------+---------------+---------+-----------+----------+--------------+   +---------+---------------+---------+-----------+----------+--------------+ LEFT     CompressibilityPhasicitySpontaneityPropertiesThrombus Aging +---------+---------------+---------+-----------+----------+--------------+ CFV      Full           Yes      Yes                                 +---------+---------------+---------+-----------+----------+--------------+ SFJ      Full                                                        +---------+---------------+---------+-----------+----------+--------------+ FV Prox  Full                                                         +---------+---------------+---------+-----------+----------+--------------+ FV Mid   Full                                                        +---------+---------------+---------+-----------+----------+--------------+ FV DistalFull                                                        +---------+---------------+---------+-----------+----------+--------------+ PFV      Full                                                        +---------+---------------+---------+-----------+----------+--------------+ POP      Full           Yes      Yes                                 +---------+---------------+---------+-----------+----------+--------------+ PTV      Full                                                        +---------+---------------+---------+-----------+----------+--------------+  PERO     Full                                                        +---------+---------------+---------+-----------+----------+--------------+     Summary: RIGHT: - There is no evidence of deep vein thrombosis in the lower extremity.  - No cystic structure found in the popliteal fossa.  LEFT: - There is no evidence of deep vein thrombosis in the lower extremity.  - No cystic structure found in the popliteal fossa.  *See table(s) above for measurements and observations. Electronically signed by Ruta Hinds MD on 02/16/2021 at 5:08:43 PM.    Final     Cardiac Studies   Echo 02/16/2021: 1. Left ventricular ejection fraction, by estimation, is 60 to 65%. The  left ventricle has normal function. The left ventricle has no regional  wall motion abnormalities. Left ventricular diastolic parameters were  normal.  2. Right ventricular systolic function is normal. The right ventricular  size is normal. There is normal pulmonary artery systolic pressure.  3. The mitral valve is normal in structure. No evidence of mitral valve  regurgitation. No evidence of mitral stenosis.  4. The  aortic valve is tricuspid. Aortic valve regurgitation is not  visualized. No aortic stenosis is present.  5. The inferior vena cava is normal in size with greater than 50%  respiratory variability, suggesting right atrial pressure of 3 mmHg.   Chest CT-A (02/15/2021): IMPRESSION: 1. No pulmonary embolus. 2. No acute intrathoracic abnormality. 3. 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 4. Multiple fluid density lesions within the liver statistically likely represent hepatic cysts.  Cardiac MRI 02/17/2021: IMPRESSION: 1. There is a 42mm x 74mm x 60mm mass adjacent to the right atrium. Mass is hypointense to myocardium on T1 weighted imaging, hyperintense on T2 weighted imaging, does not suppress with fat saturation, no contrast uptake on first pass perfusion, and no late gadolinium enhancement. This is consistent with a pericardial cyst.  2. Pericardial LGE and hyperenhancement on T2 weighted imaging suggests pericardial inflammation, consistent with acute pericarditis  3. Small pericardial effusion measuring up to 69mm adjacent to LV lateral wall  4. Normal LV size, wall thickness, and systolic function (EF 50%). No LGE to suggest myocardial scar  5.  Normal RV size and systolic function (EF 53%)  6.  Dilated ascending aorta measuring 39mm  Patient Profile     Ms. Burpee is a 20F with no cardiac history here with syncope and chest pain.  She was found to have what appears to be a larger pericardial cyst and acute pericarditis.    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  There is no evidence of hemodynamic compromise.  It seems as though her syncope was a vagally mediated in the setting of severe, stabbing chest pain.  Her chest pain has improved significantly with colchicine and ibuprofen.  We will plan to continue the colchicine for 3 months and the ibuprofen for 1 month.  She is on  appropriate PPI prophylaxis.  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  It is unclear whether it has been changing in size given that she has no prior chest imaging.  She does note that she had allergy symptoms  3 weeks ago and she does not chronically have allergies.  Did not improve with taking Zyrtec.  I suspect that she may have actually had a viral infection and that this represents viral pericarditis.  Cardiac MRI did demonstrate pericardial inflammation consistent with acute pericarditis.  She and her husband wonder whether the cyst needs to be removed.  We discussed options including serial imaging with medical management of pericarditis, aspiration with alcohol injection and surgical removal of the cyst.  They would like to have input from CT surgery as to their recommendation.  We have asked them to see her today.  We will see what they say, but on my review of the imaging it does not seem that it would be easy to aspirate from an anterior position.  It is lateral to the right atrium and the right pulmonary veins.  Is possible it could be done through intrathoracic spaces on the right, but this would require dropping her right long in the OR.  I do not see that it can be reached easily via bronchoscopy.  I suspect the best course would be to treat her pericarditis and repeat serial imaging.  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  Repeat imaging in 6-12 months.  She does not have hypertension.  # SVT:  Likely related to the pericarditis.  Management as above.     For questions or updates, please contact [place] Please consult [email or web address] for contact info under        Signed, Skeet Latch, MD  02/18/2021, 10:16 AM

## 2021-02-18 NOTE — Progress Notes (Signed)
[facility] PROGRESS NOTE    Progress Note  Jill Jenkins  KNL:[id number] DOB: 01/18/1966 DOA: 02/15/2021 PCP: Jill Noon, MD     Brief Narrative:   Jill Jenkins is an 55 y.o. female past medical history significant for migraines who comes into the [facility], ED for sudden onset of severe exertional chest pain associated with shortness of breath.  Her husband noted she had a syncopal episode for about 3 minutes.  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  Meantime she has been treating with acute pericarditis with colchicine and ibuprofen.    Assessment/Plan:   Syncope and collapse: Probably vasovagal, 2D echo was unremarkable. EEG showed no signs of seizures. Physical therapy evaluation no PT follow-up.  Acute pericarditis: Cardiac biomarkers have been negative, EKG showed no signs of ischemia. CT angio of the chest showed no PE.  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 2D echo showed an EF of 60% no wall motion abnormalities due to the concern of acute pericarditis she was started on empiric treatment with colchicine and ibuprofen.  Toradol for breakthrough pain. Continue Protonix for GI prophylaxis she is on very high dose of Advil. ESR and TSH WNL. She is still complaining of pleuritic chest pain increase ibuprofen to QID.  Pericardial cyst: Seen on CT.  Cardiac MRI showed a 40 mm x 24 mm mass adjacent to the right atrium consistent with a pericardial cyst.  TEE 2 weight imaging showed suggest pericardial inflammation consistent with acute pericarditis. There is a small pericardial effusion. 2D echo showed no evidence of hemodynamic compromise.  Further management per cardiology.  Incidental multiple hepatic cyst: Liver enzymes within normal limits need to to follow-up with PCP as an outpatient  Leukocytosis unspecified: Now resolved likely stress margination.  Migraines: Continue ibuprofen and Toradol for  breakthrough.    DVT prophylaxis: lovenox Family Communication:none Status is: Inpatient  Remains inpatient appropriate because:Hemodynamically unstable   Dispo:  Patient From: Home  Planned Disposition: Home  Medically stable for discharge: No    Code Status:     Code Status Orders  (From admission, onward)         Start     Ordered   02/15/21 2357  Full code  Continuous        02/15/21 2356        Code Status History    Date Active Date Inactive Code Status Order ID Comments User Context   02/15/2021 2355 02/15/2021 2356 Full Code [id number]  Jill Memos, DO ED   Advance Care Planning Activity        IV Access:    Peripheral IV   Procedures and diagnostic studies:   MR CARDIAC MORPHOLOGY W WO CONTRAST  Result Date: 02/17/2021 CLINICAL DATA:  Suspected pericardial cyst EXAM: CARDIAC MRI TECHNIQUE: The patient was scanned on a 1.5 Tesla Siemens magnet. A dedicated cardiac coil was used. Functional imaging was done using Fiesta sequences. 2,3, and 4 chamber views were done to assess for RWMA's. Modified Simpson's rule using a short axis stack was used to calculate an ejection fraction on a dedicated work Conservation officer, nature. The patient received 12 cc of Gadavist. After 10 minutes inversion recovery sequences were used to assess for infiltration and scar tissue. CONTRAST:  12 cc  of Gadavist FINDINGS: Left ventricle: -Normal size -Normal wall thickness -Normal systolic function -Normal ECV (25%) -No LGE LV EF:  67% (Normal 56-78%)  Absolute volumes: LV EDV: 168mL (Normal 52-141 mL) LV ESV: 62mL (Normal 13-51 mL) LV SV: 55mL (Normal 33-97 mL) CO: 4.7L/min (Normal 2.7-6.0 L/min) Indexed volumes: LV EDV: 74mL/sq-m (Normal 41-81 mL/sq-m) LV ESV: 49mL/sq-m (Normal 12-21 mL/sq-m) LV SV: 10mL/sq-m (Normal 26-56 mL/sq-m) CI: 2.2L/min/sq-m (Normal 1.8-3.8 L/min/sq-m) Right ventricle: Normal size and systolic function RV EF: 25% (Normal 47-80%) Absolute volumes: RV EDV:  143mL (Normal 58-154 mL) RV ESV: 22mL (Normal 12-68 mL) RV SV: 53mL (Normal 35-98 mL) CO: 4.6L/min (Normal 2.7-6 L/min) Indexed volumes: RV EDV: 70mL/sq-m (Normal 48-87 mL/sq-m) RV ESV: 30mL/sq-m (Normal 11-28 mL/sq-m) RV SV: 39mL/sq-m (Normal 27-57 mL/sq-m) CI: 2.2L/min/sq-m (Normal 1.8-3.8 L/min/sq-m) Left atrium: Normal size Right atrium: Normal size Mitral valve: No regurgitation Aortic valve: No regurgitation Tricuspid valve: Trivial regurgitation Pulmonic valve: No regurgitation Aorta: Dilated ascending aorta measuring 45mm Pericardium: Small effusion measuring up to 63mm adjacent to LV lateral wall. There is a 98mm x 31mm x 6mm mass adjacent to the right atrium. Mass is hypointense to myocardium on T1 weighted imaging, hyperintense on T2 weighted imaging, does not suppress with fat saturation, no contrast uptake on first pass perfusion, and no late gaolinium enhancement. This is consistent with a pericardial cyst. IMPRESSION: 1. There is a 33mm x 102mm x 28mm mass adjacent to the right atrium. Mass is hypointense to myocardium on T1 weighted imaging, hyperintense on T2 weighted imaging, does not suppress with fat saturation, no contrast uptake on first pass perfusion, and no late gadolinium enhancement. This is consistent with a pericardial cyst. 2. Pericardial LGE and hyperenhancement on T2 weighted imaging suggests pericardial inflammation, consistent with acute pericarditis 3. Small pericardial effusion measuring up to 50mm adjacent to LV lateral wall 4. Normal LV size, wall thickness, and systolic function (EF 00%). No LGE to suggest myocardial scar 5.  Normal RV size and systolic function (EF 37%) 6.  Dilated ascending aorta measuring 49mm Electronically Signed   By: Jill Milian MD   On: 02/17/2021 21:26   EEG adult  Result Date: 02/16/2021 Jill Havens, MD     02/16/2021  4:12 PM Patient Name: Jill Jenkins MRN: [id number] Epilepsy Attending: Lora Jenkins Referring Physician/Provider:  Dr Irene Jenkins Date: 02/16/2021 Duration: 22.34 mins Patient history: 55yo F with syncope. EEG to evaluate for seizure Level of alertness: Awake, asleep AEDs during EEG study: TPM Technical aspects: This EEG study was done with scalp electrodes positioned according to the 10-20 International system of electrode placement. Electrical activity was acquired at a sampling rate of $Remov'500Hz'mwXPDf$  and reviewed with a high frequency filter of $RemoveB'70Hz'OXEaKueI$  and a low frequency filter of $RemoveB'1Hz'YpujJIaM$ . EEG data were recorded continuously and digitally stored. Description: The posterior dominant rhythm consists of 9 Hz activity of moderate voltage (25-35 uV) seen predominantly in posterior head regions, symmetric and reactive to eye opening and eye closing. Sleep was characterized by vertex waves, sleep spindles (12 to 14 Hz), maximal frontocentral region. Physiologic photic driving was seen during photic stimulation.  Hyperventilation was not performed.   IMPRESSION: This study is within normal limits. No seizures or epileptiform discharges were seen throughout the recording. Jill Jenkins   ECHOCARDIOGRAM COMPLETE  Result Date: 02/16/2021    ECHOCARDIOGRAM REPORT   Patient Name:   Jamin P Mannings Date of Exam: 02/16/2021 Medical Rec #:  [id number]      Height:       70.0 in Accession #:    [id number]     Weight:  179.9 lb Date of Birth:  08-16-1966      BSA:          1.995 m Patient Age:    53 years       BP:           120/84 mmHg Patient Gender: F              HR:           83 bpm. Exam Location:  Inpatient Procedure: 2D Echo, Cardiac Doppler and Color Doppler Indications:    Syncope R55  History:        Patient has no prior history of Echocardiogram examinations.                 Signs/Symptoms:Chest Pain and Syncope. Acute idiopathic                 pericarditis, Pericardial cyst.  Sonographer:    Alvino Chapel RCS Referring Phys: [id number] [place]  1. Left ventricular ejection fraction, by estimation, is 60 to 65%. The left  ventricle has normal function. The left ventricle has no regional wall motion abnormalities. Left ventricular diastolic parameters were normal.  2. Right ventricular systolic function is normal. The right ventricular size is normal. There is normal pulmonary artery systolic pressure.  3. The mitral valve is normal in structure. No evidence of mitral valve regurgitation. No evidence of mitral stenosis.  4. The aortic valve is tricuspid. Aortic valve regurgitation is not visualized. No aortic stenosis is present.  5. The inferior vena cava is normal in size with greater than 50% respiratory variability, suggesting right atrial pressure of 3 mmHg. FINDINGS  Left Ventricle: Left ventricular ejection fraction, by estimation, is 60 to 65%. The left ventricle has normal function. The left ventricle has no regional wall motion abnormalities. The left ventricular internal cavity size was normal in size. There is  no left ventricular hypertrophy. Left ventricular diastolic parameters were normal. Normal left ventricular filling pressure. Right Ventricle: The right ventricular size is normal. No increase in right ventricular wall thickness. Right ventricular systolic function is normal. There is normal pulmonary artery systolic pressure. The tricuspid regurgitant velocity is 2.14 m/s, and  with an assumed right atrial pressure of 3 mmHg, the estimated right ventricular systolic pressure is 51.0 mmHg. Left Atrium: Left atrial size was normal in size. Right Atrium: Right atrial size was normal in size. Pericardium: There is no evidence of pericardial effusion. Mitral Valve: The mitral valve is normal in structure. No evidence of mitral valve regurgitation. No evidence of mitral valve stenosis. Tricuspid Valve: The tricuspid valve is normal in structure. Tricuspid valve regurgitation is mild . No evidence of tricuspid stenosis. Aortic Valve: The aortic valve is tricuspid. Aortic valve regurgitation is not visualized. No aortic  stenosis is present. Pulmonic Valve: The pulmonic valve was normal in structure. Pulmonic valve regurgitation is not visualized. No evidence of pulmonic stenosis. Aorta: The aortic root is normal in size and structure. Venous: The inferior vena cava is normal in size with greater than 50% respiratory variability, suggesting right atrial pressure of 3 mmHg. IAS/Shunts: No atrial level shunt detected by color flow Doppler.  LEFT VENTRICLE PLAX 2D LVIDd:         4.50 cm  Diastology LVIDs:         2.40 cm  LV e' medial:    8.70 cm/s LV PW:         0.90 cm  LV E/e' medial:  8.8 LV IVS:        0.90 cm  LV e' lateral:   11.00 cm/s LVOT diam:     2.00 cm  LV E/e' lateral: 7.0 LV SV:         84 LV SV Index:   42 LVOT Area:     3.14 cm  RIGHT VENTRICLE RV S prime:     14.10 cm/s TAPSE (M-mode): 2.0 cm LEFT ATRIUM             Index       RIGHT ATRIUM           Index LA diam:        3.00 cm 1.50 cm/m  RA Area:     19.40 cm LA Vol (A2C):   39.9 ml 20.00 ml/m RA Volume:   56.70 ml  28.42 ml/m LA Vol (A4C):   42.8 ml 21.45 ml/m LA Biplane Vol: 43.3 ml 21.70 ml/m  AORTIC VALVE LVOT Vmax:   117.00 cm/s LVOT Vmean:  79.000 cm/s LVOT VTI:    0.266 m  AORTA Ao Root diam: 3.40 cm MITRAL VALVE               TRICUSPID VALVE MV Area (PHT): 3.91 cm    TR Peak grad:   18.3 mmHg MV Decel Time: 194 msec    TR Vmax:        214.00 cm/s MV E velocity: 76.70 cm/s MV A velocity: 66.40 cm/s  SHUNTS MV E/A ratio:  1.16        Systemic VTI:  0.27 m                            Systemic Diam: 2.00 cm Dani Gobble Croitoru MD Electronically signed by Sanda Klein MD Signature Date/Time: 02/16/2021/3:06:02 PM    Final    VAS [place] LOWER EXTREMITY VENOUS (DVT)  Result Date: 02/16/2021  Lower Venous DVT Study Patient Name:  Caytlyn P Schmaltz  Date of Exam:   02/16/2021 Medical Rec #: [id number]       Accession #:    [id number] Date of Birth: 11-Oct-1965       Patient Gender: F Patient Age:   39Y Exam Location[facility]  [facility] Procedure:      VAS [place] LOWER  EXTREMITY VENOUS (DVT) Referring Phys: [id number] [place] --------------------------------------------------------------------------------  Indications: Elevated Ddimer.  Risk Factors: None identified. Comparison Study: No prior studies. Performing Technologist: Oliver Hum RVT  Examination Guidelines: A complete evaluation includes B-mode imaging, spectral Doppler, color Doppler, and power Doppler as needed of all accessible portions of each vessel. Bilateral testing is considered an integral part of a complete examination. Limited examinations for reoccurring indications may be performed as noted. The reflux portion of the exam is performed with the patient in reverse Trendelenburg.  +---------+---------------+---------+-----------+----------+--------------+ RIGHT    CompressibilityPhasicitySpontaneityPropertiesThrombus Aging +---------+---------------+---------+-----------+----------+--------------+ CFV      Full           Yes      Yes                                 +---------+---------------+---------+-----------+----------+--------------+ SFJ      Full                                                        +---------+---------------+---------+-----------+----------+--------------+  FV Prox  Full                                                        +---------+---------------+---------+-----------+----------+--------------+ FV Mid   Full                                                        +---------+---------------+---------+-----------+----------+--------------+ FV DistalFull                                                        +---------+---------------+---------+-----------+----------+--------------+ PFV      Full                                                        +---------+---------------+---------+-----------+----------+--------------+ POP      Full           Yes      Yes                                  +---------+---------------+---------+-----------+----------+--------------+ PTV      Full                                                        +---------+---------------+---------+-----------+----------+--------------+ PERO     Full                                                        +---------+---------------+---------+-----------+----------+--------------+   +---------+---------------+---------+-----------+----------+--------------+ LEFT     CompressibilityPhasicitySpontaneityPropertiesThrombus Aging +---------+---------------+---------+-----------+----------+--------------+ CFV      Full           Yes      Yes                                 +---------+---------------+---------+-----------+----------+--------------+ SFJ      Full                                                        +---------+---------------+---------+-----------+----------+--------------+ FV Prox  Full                                                        +---------+---------------+---------+-----------+----------+--------------+  FV Mid   Full                                                        +---------+---------------+---------+-----------+----------+--------------+ FV DistalFull                                                        +---------+---------------+---------+-----------+----------+--------------+ PFV      Full                                                        +---------+---------------+---------+-----------+----------+--------------+ POP      Full           Yes      Yes                                 +---------+---------------+---------+-----------+----------+--------------+ PTV      Full                                                        +---------+---------------+---------+-----------+----------+--------------+ PERO     Full                                                         +---------+---------------+---------+-----------+----------+--------------+     Summary: RIGHT: - There is no evidence of deep vein thrombosis in the lower extremity.  - No cystic structure found in the popliteal fossa.  LEFT: - There is no evidence of deep vein thrombosis in the lower extremity.  - No cystic structure found in the popliteal fossa.  *See table(s) above for measurements and observations. Electronically signed by Ruta Hinds MD on 02/16/2021 at 5:08:43 PM.    Final      Medical Consultants:    None.   Subjective:    Jill Jenkins relates her pain is not controlled  Objective:    Vitals:   02/17/21 1200 02/17/21 2014 02/18/21 0540 02/18/21 0700  BP: 115/84 121/79 109/66 (!) 104/59  Pulse:  74 66 67  Resp:  18 17 (!) 22  Temp:  97.7 F (36.5 C) 98.4 F (36.9 C) 98.3 F (36.8 C)  TempSrc:  Oral Oral Oral  SpO2:  99% 97%   Weight:   91.6 kg    SpO2: 97 %   Intake/Output Summary (Last 24 hours) at 02/18/2021 0813 Last data filed at 02/17/2021 2133 Gross per 24 hour  Intake 463 ml  Output --  Net 463 ml   Filed Weights   02/16/21 0859 02/17/21 0210 02/18/21 0540  Weight: 81.6 kg 91.5 kg 91.6 kg    Exam: General exam:  In no acute distress. Respiratory system: Good air movement and clear to auscultation. Cardiovascular system: S1 & S2 heard, RRR. No JVD, murmurs, rubs, gallops or clicks.  Gastrointestinal system: Abdomen is nondistended, soft and nontender.  Central nervous system: Alert and oriented. No focal neurological deficits. Extremities: No pedal edema. Skin: No rashes, lesions or ulcers Psychiatry: Judgement and insight appear normal. Mood & affect appropriate.    Data Reviewed:    Labs: Basic Metabolic Panel: Recent Labs  Lab 02/15/21 1502 02/16/21 0200 02/16/21 1052  NA 140 138  --   K 3.9 3.7  --   CL 110 109  --   CO2 19* 20*  --   GLUCOSE 100* 131*  --   BUN 15 12  --   CREATININE 1.04* 0.93 1.04*  CALCIUM 9.7 8.9  --   MG   --  1.8  --   PHOS  --  4.5  --    GFR Estimated Creatinine Clearance: 75.9 mL/min (A) (by C-G formula based on SCr of 1.04 mg/dL (H)). Liver Function Tests: Recent Labs  Lab 02/15/21 1951  AST 24  ALT 38  ALKPHOS 39  BILITOT 0.7  PROT 6.8  ALBUMIN 3.9   Recent Labs  Lab 02/15/21 1951  LIPASE 31   No results for input(s): AMMONIA in the last 168 hours. Coagulation profile No results for input(s): INR, PROTIME in the last 168 hours. COVID-19 Labs  Recent Labs    02/15/21 1951 02/16/21 0900  DDIMER 0.62*  --   CRP  --  1.4*    Lab Results  Component Value Date   SARSCOV2NAA NEGATIVE 02/15/2021    CBC: Recent Labs  Lab 02/15/21 1502 02/16/21 0200 02/16/21 1052  WBC 11.5* 13.1* 10.5  HGB 15.0 13.7 14.0  HCT 46.3* 41.7 41.5  MCV 97.3 96.8 95.4  PLT 204 186 170   Cardiac Enzymes: No results for input(s): CKTOTAL, CKMB, CKMBINDEX, TROPONINI in the last 168 hours. BNP (last 3 results) No results for input(s): PROBNP in the last 8760 hours. CBG: Recent Labs  Lab 02/17/21 0610 02/18/21 0547  GLUCAP 112* 110*   D-Dimer: Recent Labs    02/15/21 1951  DDIMER 0.62*   Hgb A1c: No results for input(s): HGBA1C in the last 72 hours. Lipid Profile: Recent Labs    02/16/21 0200  CHOL 192  HDL 48  LDLCALC 117*  TRIG 133  CHOLHDL 4.0   Thyroid function studies: Recent Labs    02/16/21 1052  TSH 1.907   Anemia work up: No results for input(s): VITAMINB12, FOLATE, FERRITIN, TIBC, IRON, RETICCTPCT in the last 72 hours. Sepsis Labs: Recent Labs  Lab 02/15/21 1502 02/16/21 0200 02/16/21 1052  WBC 11.5* 13.1* 10.5   Microbiology Recent Results (from the past 240 hour(s))  Resp Panel by RT-PCR (Flu A&B, Covid) Nasopharyngeal Swab     Status: None   Collection Time: 02/15/21  8:09 PM   Specimen: Nasopharyngeal Swab; Nasopharyngeal(NP) swabs in vial transport medium  Result Value Ref Range Status   SARS Coronavirus 2 by RT PCR NEGATIVE NEGATIVE  Final    Comment: (NOTE) SARS-CoV-2 target nucleic acids are NOT DETECTED.  The SARS-CoV-2 RNA is generally detectable in upper respiratory specimens during the acute phase of infection. The lowest concentration of SARS-CoV-2 viral copies this assay can detect is 138 copies/mL. A negative result does not preclude SARS-Cov-2 infection and should not be used as the sole basis for treatment or other patient management decisions. A negative  result may occur with  improper specimen collection/handling, submission of specimen other than nasopharyngeal swab, presence of viral mutation(s) within the areas targeted by this assay, and inadequate number of viral copies(<138 copies/mL). A negative result must be combined with clinical observations, patient history, and epidemiological information. The expected result is Negative.  Fact Sheet for Patients:  EntrepreneurPulse.com.au  Fact Sheet for Healthcare Providers:  IncredibleEmployment.be  This test is no t yet approved or cleared by the [place] FDA and  has been authorized for detection and/or diagnosis of SARS-CoV-2 by FDA under an Emergency Use Authorization (EUA). This EUA will remain  in effect (meaning this test can be used) for the duration of the COVID-19 declaration under Section 564(b)(1) of the Act, 21 U.S.C.section 360bbb-3(b)(1), unless the authorization is terminated  or revoked sooner.       Influenza A by PCR NEGATIVE NEGATIVE Final   Influenza B by PCR NEGATIVE NEGATIVE Final    Comment: (NOTE) The Xpert Xpress SARS-CoV-2/FLU/RSV plus assay is intended as an aid in the diagnosis of influenza from Nasopharyngeal swab specimens and should not be used as a sole basis for treatment. Nasal washings and aspirates are unacceptable for Xpert Xpress SARS-CoV-2/FLU/RSV testing.  Fact Sheet for Patients: EntrepreneurPulse.com.au  Fact Sheet for Healthcare  Providers: IncredibleEmployment.be  This test is not yet approved or cleared by the [place] FDA and has been authorized for detection and/or diagnosis of SARS-CoV-2 by FDA under an Emergency Use Authorization (EUA). This EUA will remain in effect (meaning this test can be used) for the duration of the COVID-19 declaration under Section 564(b)(1) of the Act, 21 U.S.C. section 360bbb-3(b)(1), unless the authorization is terminated or revoked.  Performed at [facility], [place] [address], [place], [place] [address]      Medications:   . colchicine  0.6 mg Oral Daily  . enoxaparin (LOVENOX) injection  40 mg Subcutaneous Q24H  . ibuprofen  800 mg Oral Q8H  . pantoprazole  40 mg Oral Daily  . polyethylene glycol  17 g Oral Daily  . senna-docusate  2 tablet Oral BID  . sodium chloride flush  3 mL Intravenous Q12H  . topiramate  50 mg Oral BID   Continuous Infusions:    LOS: 1 day   Charlynne Cousins  [facility]  02/18/2021, 8:13 AM

## 2021-02-18 NOTE — Consult Note (Signed)
[address]       [place] [address]             [phone]                    [place] Medical Record #[id number] Date of Birth: 03/27/1966  Referring: No ref. provider found Primary Care: Chesley Noon, MD Primary Cardiologist: Skeet Latch, MD  Chief Complaint:   No chief complaint on file.   History of Present Illness:    Jill Jenkins 55 y.o. female admitted to the medical service with acute onset chest and pleuritic.  She stated that over the weekend she had 2 episodes of sharp pain followed by some shortness of breath.  EKG was negative for cardiogenic cause.  CRP was slightly elevated raising concern for pericarditis.  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  CTS was consulted to assist with management    Past Medical History:  Diagnosis Date  . Insomnia   . Low back pain   . Menorrhagia 03/28/2012  . Migraines   . Renal calculus     Past Surgical History:  Procedure Laterality Date  . DILATION AND CURETTAGE OF UTERUS    . POLYPECTOMY  03/29/2012   Procedure: POLYPECTOMY;  Surgeon: Thornell Sartorius, MD;  Location: [facility];  Service: Gynecology;  Laterality: N/A;    Family History  Problem Relation Age of Onset  . Hypertension Mother   . Hypertension Father   . Stroke Maternal Grandfather      Social History   Tobacco Use  Smoking Status Never Smoker  Smokeless Tobacco Never Used    Social History   Substance and Sexual Activity  Alcohol Use No     Allergies  Allergen Reactions  . No Known Allergies     Current Facility-Administered Medications  Medication Dose Route Frequency Provider Last Rate Last Admin  . acetaminophen (TYLENOL) tablet 650 mg  650 mg Oral Q6H PRN Irene Pap N, DO   650 mg at 02/17/21 1126  . colchicine tablet 0.6 mg  0.6 mg Oral Daily Skeet Latch, MD   0.6 mg at 02/18/21 1031  . eletriptan (RELPAX) tablet 40 mg  40 mg Oral Q2H PRN Irene Pap N, DO       . enoxaparin (LOVENOX) injection 40 mg  40 mg Subcutaneous Q24H Hall, Carole N, DO   40 mg at 02/18/21 1031  . ibuprofen (ADVIL) tablet 800 mg  800 mg Oral QID Charlynne Cousins, MD   800 mg at 02/18/21 1032  . ketorolac (TORADOL) 15 MG/ML injection 15 mg  15 mg Intravenous Q12H PRN Skeet Latch, MD   15 mg at 02/17/21 1446  . melatonin tablet 3 mg  3 mg Oral QHS PRN Irene Pap N, DO      . morphine 2 MG/ML injection 2 mg  2 mg Intravenous Q3H PRN Irene Pap N, DO   2 mg at 02/17/21 0753  . ondansetron (ZOFRAN) injection 4 mg  4 mg Intravenous Q6H PRN Irene Pap N, DO      . pantoprazole (PROTONIX) EC tablet 40 mg  40 mg Oral BID Charlynne Cousins, MD   40 mg at 02/18/21 1035  . polyethylene glycol (MIRALAX / GLYCOLAX) packet 17 g  17 g Oral Daily Hosie Poisson, MD   17 g at 02/17/21 1651  . senna-docusate (Senokot-S) tablet 2 tablet  2 tablet Oral BID  Hosie Poisson, MD   2 tablet at 02/18/21 1035  . sodium chloride flush (NS) 0.9 % injection 3 mL  3 mL Intravenous Q12H Hall, Carole N, DO   3 mL at 02/18/21 1038  . topiramate (TOPAMAX) tablet 25 mg  25 mg Oral BID Skeet Latch, MD        Review of Systems  Constitutional: Negative.   Respiratory: Positive for shortness of breath.   Cardiovascular: Positive for chest pain.  Neurological: Positive for headaches.    PHYSICAL EXAMINATION: BP 126/73 (BP Location: Left Arm)   Pulse 66   Temp 98 F (36.7 C) (Oral)   Resp 18   Wt 91.6 kg   SpO2 100%   BMI 28.98 kg/m   Physical Exam Constitutional:      General: She is not in acute distress.    Appearance: Normal appearance. She is normal weight. She is not ill-appearing, toxic-appearing or diaphoretic.  HENT:     Head: Normocephalic and atraumatic.  Eyes:     Extraocular Movements: Extraocular movements intact.  Cardiovascular:     Rate and Rhythm: Normal rate.  Pulmonary:     Effort: Pulmonary effort is normal. No respiratory distress.  Abdominal:      General: There is no distension.  Musculoskeletal:        General: Normal range of motion.     Cervical back: Normal range of motion.  Skin:    General: Skin is warm and dry.  Neurological:     General: No focal deficit present.     Mental Status: She is alert and oriented to person, place, and time.      Diagnostic Studies & Laboratory data:     Recent Radiology Findings:   DG Chest 2 View  Result Date: 02/15/2021 CLINICAL DATA:  Chest pain and shortness of breath. EXAM: CHEST - 2 VIEW COMPARISON:  None. FINDINGS: The heart size and mediastinal contours are within normal limits. Both lungs are clear. The visualized skeletal structures are unremarkable. IMPRESSION: No active cardiopulmonary disease. Electronically Signed   By: Virgina Norfolk M.D.   On: 02/15/2021 15:17   CT Angio Chest PE W and/or Wo Contrast  Result Date: 02/16/2021 CLINICAL DATA:  sharp cp. Patient stated that the pain started after putting sheets on the bed. PE suspected, low/intermediate prob, positive D-dimer EXAM: CT ANGIOGRAPHY CHEST WITH CONTRAST TECHNIQUE: Multidetector CT imaging of the chest was performed using the standard protocol during bolus administration of intravenous contrast. Multiplanar CT image reconstructions and MIPs were obtained to evaluate the vascular anatomy. CONTRAST:  53mL OMNIPAQUE IOHEXOL 350 MG/ML SOLN COMPARISON:  None. FINDINGS: Cardiovascular: Satisfactory opacification of the pulmonary arteries to the segmental level. No evidence of pulmonary embolism. Normal heart size. No significant pericardial effusion. The thoracic aorta is normal in caliber. No atherosclerotic plaque of the thoracic aorta. No definite coronary artery calcifications. 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 No enlarged mediastinal, hilar, or axillary lymph nodes. Thyroid gland, trachea, and esophagus demonstrate no  significant findings. Lungs/Pleura: Bilateral lower lobe subsegmental atelectasis. No focal consolidation. No pulmonary nodule. No pulmonary mass. No pleural effusion. No pneumothorax Upper Abdomen: Multiple fluid density lesions within the liver likely represent hepatic cysts with the largest in the left hepatic lobe measuring up to 3.8 cm. Musculoskeletal: No chest wall abnormality.  Bilateral breast implants. No suspicious lytic or blastic osseous lesions. No acute displaced fracture.  Multilevel degenerative changes of the spine. Review of the MIP images confirms the above findings. IMPRESSION: 1. No pulmonary embolus. 2. No acute intrathoracic abnormality. 3. 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 4. Multiple fluid density lesions within the liver statistically likely represent hepatic cysts. Electronically Signed   By: Iven Finn M.D.   On: 02/16/2021 00:08   MR CARDIAC MORPHOLOGY W WO CONTRAST  Result Date: 02/17/2021 CLINICAL DATA:  Suspected pericardial cyst EXAM: CARDIAC MRI TECHNIQUE: The patient was scanned on a 1.5 Tesla Siemens magnet. A dedicated cardiac coil was used. Functional imaging was done using Fiesta sequences. 2,3, and 4 chamber views were done to assess for RWMA's. Modified Simpson's rule using a short axis stack was used to calculate an ejection fraction on a dedicated work Conservation officer, nature. The patient received 12 cc of Gadavist. After 10 minutes inversion recovery sequences were used to assess for infiltration and scar tissue. CONTRAST:  12 cc  of Gadavist FINDINGS: Left ventricle: -Normal size -Normal wall thickness -Normal systolic function -Normal ECV (25%) -No LGE LV EF:  67% (Normal 56-78%) Absolute volumes: LV EDV: 19mL (Normal 52-141 mL) LV ESV: 40mL (Normal 13-51 mL) LV SV: 41mL (Normal 33-97 mL) CO: 4.7L/min (Normal 2.7-6.0 L/min) Indexed volumes: LV EDV: 51mL/sq-m (Normal 41-81 mL/sq-m) LV ESV: 23mL/sq-m (Normal 12-21 mL/sq-m) LV SV: 39mL/sq-m  (Normal 26-56 mL/sq-m) CI: 2.2L/min/sq-m (Normal 1.8-3.8 L/min/sq-m) Right ventricle: Normal size and systolic function RV EF: 38% (Normal 47-80%) Absolute volumes: RV EDV: 14mL (Normal 58-154 mL) RV ESV: 55mL (Normal 12-68 mL) RV SV: 12mL (Normal 35-98 mL) CO: 4.6L/min (Normal 2.7-6 L/min) Indexed volumes: RV EDV: 50mL/sq-m (Normal 48-87 mL/sq-m) RV ESV: 4mL/sq-m (Normal 11-28 mL/sq-m) RV SV: 21mL/sq-m (Normal 27-57 mL/sq-m) CI: 2.2L/min/sq-m (Normal 1.8-3.8 L/min/sq-m) Left atrium: Normal size Right atrium: Normal size Mitral valve: No regurgitation Aortic valve: No regurgitation Tricuspid valve: Trivial regurgitation Pulmonic valve: No regurgitation Aorta: Dilated ascending aorta measuring 31mm Pericardium: Small effusion measuring up to 8mm adjacent to LV lateral wall. There is a 69mm x 43mm x 75mm mass adjacent to the right atrium. Mass is hypointense to myocardium on T1 weighted imaging, hyperintense on T2 weighted imaging, does not suppress with fat saturation, no contrast uptake on first pass perfusion, and no late gaolinium enhancement. This is consistent with a pericardial cyst. IMPRESSION: 1. There is a 83mm x 72mm x 1mm mass adjacent to the right atrium. Mass is hypointense to myocardium on T1 weighted imaging, hyperintense on T2 weighted imaging, does not suppress with fat saturation, no contrast uptake on first pass perfusion, and no late gadolinium enhancement. This is consistent with a pericardial cyst. 2. Pericardial LGE and hyperenhancement on T2 weighted imaging suggests pericardial inflammation, consistent with acute pericarditis 3. Small pericardial effusion measuring up to 34mm adjacent to LV lateral wall 4. Normal LV size, wall thickness, and systolic function (EF 10%). No LGE to suggest myocardial scar 5.  Normal RV size and systolic function (EF 17%) 6.  Dilated ascending aorta measuring 52mm Electronically Signed   By: Oswaldo Milian MD   On: 02/17/2021 21:26   EEG  adult  Result Date: 02/16/2021 Lora Havens, MD     02/16/2021  4:12 PM Patient Name: HELMI HECHAVARRIA MRN: [id number] Epilepsy Attending: Lora Havens Referring Physician/Provider: Dr Irene Pap Date: 02/16/2021 Duration: 22.34 mins Patient history: 55yo F with syncope. EEG to evaluate for seizure Level of alertness: Awake, asleep AEDs during EEG study: TPM Technical aspects: This EEG study was  done with scalp electrodes positioned according to the 10-20 International system of electrode placement. Electrical activity was acquired at a sampling rate of 500Hz  and reviewed with a high frequency filter of 70Hz  and a low frequency filter of 1Hz . EEG data were recorded continuously and digitally stored. Description: The posterior dominant rhythm consists of 9 Hz activity of moderate voltage (25-35 uV) seen predominantly in posterior head regions, symmetric and reactive to eye opening and eye closing. Sleep was characterized by vertex waves, sleep spindles (12 to 14 Hz), maximal frontocentral region. Physiologic photic driving was seen during photic stimulation.  Hyperventilation was not performed.   IMPRESSION: This study is within normal limits. No seizures or epileptiform discharges were seen throughout the recording. Lora Havens   ECHOCARDIOGRAM COMPLETE  Result Date: 02/16/2021    ECHOCARDIOGRAM REPORT   Patient Name:   Reilynn P Crenshaw Date of Exam: 02/16/2021 Medical Rec #:  [id number]      Height:       70.0 in Accession #:    [id number]     Weight:       179.9 lb Date of Birth:  1965-12-30      BSA:          1.995 m Patient Age:    50 years       BP:           120/84 mmHg Patient Gender: F              HR:           83 bpm. Exam Location:  Inpatient Procedure: 2D Echo, Cardiac Doppler and Color Doppler Indications:    Syncope R55  History:        Patient has no prior history of Echocardiogram examinations.                 Signs/Symptoms:Chest Pain and Syncope. Acute idiopathic                  pericarditis, Pericardial cyst.  Sonographer:    Alvino Chapel RCS Referring Phys: [id number] Sa[place]  1. Left ventricular ejection fraction, by estimation, is 60 to 65%. The left ventricle has normal function. The left ventricle has no regional wall motion abnormalities. Left ventricular diastolic parameters were normal.  2. Right ventricular systolic function is normal. The right ventricular size is normal. There is normal pulmonary artery systolic pressure.  3. The mitral valve is normal in structure. No evidence of mitral valve regurgitation. No evidence of mitral stenosis.  4. The aortic valve is tricuspid. Aortic valve regurgitation is not visualized. No aortic stenosis is present.  5. The inferior vena cava is normal in size with greater than 50% respiratory variability, suggesting right atrial pressure of 3 mmHg. FINDINGS  Left Ventricle: Left ventricular ejection fraction, by estimation, is 60 to 65%. The left ventricle has normal function. The left ventricle has no regional wall motion abnormalities. The left ventricular internal cavity size was normal in size. There is  no left ventricular hypertrophy. Left ventricular diastolic parameters were normal. Normal left ventricular filling pressure. Right Ventricle: The right ventricular size is normal. No increase in right ventricular wall thickness. Right ventricular systolic function is normal. There is normal pulmonary artery systolic pressure. The tricuspid regurgitant velocity is 2.14 m/s, and  with an assumed right atrial pressure of 3 mmHg, the estimated right ventricular systolic pressure is 56.3 mmHg. Left Atrium: Left atrial size was normal in size. Right Atrium: Right  atrial size was normal in size. Pericardium: There is no evidence of pericardial effusion. Mitral Valve: The mitral valve is normal in structure. No evidence of mitral valve regurgitation. No evidence of mitral valve stenosis. Tricuspid Valve: The tricuspid valve is  normal in structure. Tricuspid valve regurgitation is mild . No evidence of tricuspid stenosis. Aortic Valve: The aortic valve is tricuspid. Aortic valve regurgitation is not visualized. No aortic stenosis is present. Pulmonic Valve: The pulmonic valve was normal in structure. Pulmonic valve regurgitation is not visualized. No evidence of pulmonic stenosis. Aorta: The aortic root is normal in size and structure. Venous: The inferior vena cava is normal in size with greater than 50% respiratory variability, suggesting right atrial pressure of 3 mmHg. IAS/Shunts: No atrial level shunt detected by color flow Doppler.  LEFT VENTRICLE PLAX 2D LVIDd:         4.50 cm  Diastology LVIDs:         2.40 cm  LV e' medial:    8.70 cm/s LV PW:         0.90 cm  LV E/e' medial:  8.8 LV IVS:        0.90 cm  LV e' lateral:   11.00 cm/s LVOT diam:     2.00 cm  LV E/e' lateral: 7.0 LV SV:         84 LV SV Index:   42 LVOT Area:     3.14 cm  RIGHT VENTRICLE RV S prime:     14.10 cm/s TAPSE (M-mode): 2.0 cm LEFT ATRIUM             Index       RIGHT ATRIUM           Index LA diam:        3.00 cm 1.50 cm/m  RA Area:     19.40 cm LA Vol (A2C):   39.9 ml 20.00 ml/m RA Volume:   56.70 ml  28.42 ml/m LA Vol (A4C):   42.8 ml 21.45 ml/m LA Biplane Vol: 43.3 ml 21.70 ml/m  AORTIC VALVE LVOT Vmax:   117.00 cm/s LVOT Vmean:  79.000 cm/s LVOT VTI:    0.266 m  AORTA Ao Root diam: 3.40 cm MITRAL VALVE               TRICUSPID VALVE MV Area (PHT): 3.91 cm    TR Peak grad:   18.3 mmHg MV Decel Time: 194 msec    TR Vmax:        214.00 cm/s MV E velocity: 76.70 cm/s MV A velocity: 66.40 cm/s  SHUNTS MV E/A ratio:  1.16        Systemic VTI:  0.27 m                            Systemic Diam: 2.00 cm Dani Gobble Croitoru MD Electronically signed by Sanda Klein MD Signature Date/Time: 02/16/2021/3:06:02 PM    Final    VAS [place] LOWER EXTREMITY VENOUS (DVT)  Result Date: 02/16/2021  Lower Venous DVT Study Patient Name:  Justus P Loh  Date of Exam:   02/16/2021  Medical Rec #: [id number]       Accession #:    [id number] Date of Birth: 14-Sep-1966       Patient Gender: F Patient Age:   57Y Exam Location:  [facility] Procedure:      VAS [place] LOWER EXTREMITY VENOUS (DVT) Referring Phys: [id number] Archie Patten  N HALL --------------------------------------------------------------------------------  Indications: Elevated Ddimer.  Risk Factors: None identified. Comparison Study: No prior studies. Performing Technologist: Oliver Hum RVT  Examination Guidelines: A complete evaluation includes B-mode imaging, spectral Doppler, color Doppler, and power Doppler as needed of all accessible portions of each vessel. Bilateral testing is considered an integral part of a complete examination. Limited examinations for reoccurring indications may be performed as noted. The reflux portion of the exam is performed with the patient in reverse Trendelenburg.  +---------+---------------+---------+-----------+----------+--------------+ RIGHT    CompressibilityPhasicitySpontaneityPropertiesThrombus Aging +---------+---------------+---------+-----------+----------+--------------+ CFV      Full           Yes      Yes                                 +---------+---------------+---------+-----------+----------+--------------+ SFJ      Full                                                        +---------+---------------+---------+-----------+----------+--------------+ FV Prox  Full                                                        +---------+---------------+---------+-----------+----------+--------------+ FV Mid   Full                                                        +---------+---------------+---------+-----------+----------+--------------+ FV DistalFull                                                        +---------+---------------+---------+-----------+----------+--------------+ PFV      Full                                                         +---------+---------------+---------+-----------+----------+--------------+ POP      Full           Yes      Yes                                 +---------+---------------+---------+-----------+----------+--------------+ PTV      Full                                                        +---------+---------------+---------+-----------+----------+--------------+ PERO     Full                                                        +---------+---------------+---------+-----------+----------+--------------+   +---------+---------------+---------+-----------+----------+--------------+  LEFT     CompressibilityPhasicitySpontaneityPropertiesThrombus Aging +---------+---------------+---------+-----------+----------+--------------+ CFV      Full           Yes      Yes                                 +---------+---------------+---------+-----------+----------+--------------+ SFJ      Full                                                        +---------+---------------+---------+-----------+----------+--------------+ FV Prox  Full                                                        +---------+---------------+---------+-----------+----------+--------------+ FV Mid   Full                                                        +---------+---------------+---------+-----------+----------+--------------+ FV DistalFull                                                        +---------+---------------+---------+-----------+----------+--------------+ PFV      Full                                                        +---------+---------------+---------+-----------+----------+--------------+ POP      Full           Yes      Yes                                 +---------+---------------+---------+-----------+----------+--------------+ PTV      Full                                                         +---------+---------------+---------+-----------+----------+--------------+ PERO     Full                                                        +---------+---------------+---------+-----------+----------+--------------+     Summary: RIGHT: - There is no evidence of deep vein thrombosis in the lower extremity.  - No cystic structure found in the popliteal fossa.  LEFT: - There is no evidence of deep vein thrombosis in the lower extremity.  - No  cystic structure found in the popliteal fossa.  *See table(s) above for measurements and observations. Electronically signed by Ruta Hinds MD on 02/16/2021 at 5:08:43 PM.    Final        I have independently reviewed the above radiology studies  and reviewed the findings with the patient.   Recent Lab Findings: Lab Results  Component Value Date   WBC 10.5 02/16/2021   HGB 14.0 02/16/2021   HCT 41.5 02/16/2021   PLT 170 02/16/2021   GLUCOSE 131 (H) 02/16/2021   CHOL 192 02/16/2021   TRIG 133 02/16/2021   HDL 48 02/16/2021   LDLCALC 117 (H) 02/16/2021   ALT 38 02/15/2021   AST 24 02/15/2021   NA 138 02/16/2021   K 3.7 02/16/2021   CL 109 02/16/2021   CREATININE 1.04 (H) 02/16/2021   BUN 12 02/16/2021   CO2 20 (L) 02/16/2021   TSH 1.907 02/16/2021     Assessment / Plan:   55 yo female presents with atypical chest pain, and pleuritic symptoms.  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  It is unclear as to whether this is the source of her pain.  I had a long discussion with her and her husband.  We talked about the possibility of observing this cyst for 2-3, vs robotic assisted resection on 6/9.  They will let me know tomorrow how they wish to proceed.      Lajuana Matte 02/18/2021 12:19 PM

## 2021-02-19 ENCOUNTER — Inpatient Hospital Stay (HOSPITAL_COMMUNITY): Payer: BC Managed Care – PPO

## 2021-02-19 ENCOUNTER — Other Ambulatory Visit (HOSPITAL_COMMUNITY): Payer: BC Managed Care – PPO

## 2021-02-19 DIAGNOSIS — I301 Infective pericarditis: Secondary | ICD-10-CM

## 2021-02-19 LAB — BASIC METABOLIC PANEL
Anion gap: 9 (ref 5–15)
BUN: 12 mg/dL (ref 6–20)
CO2: 18 mmol/L — ABNORMAL LOW (ref 22–32)
Calcium: 8.8 mg/dL — ABNORMAL LOW (ref 8.9–10.3)
Chloride: 113 mmol/L — ABNORMAL HIGH (ref 98–111)
Creatinine, Ser: 1 mg/dL (ref 0.44–1.00)
GFR, Estimated: >60 mL/min (ref >60)
Glucose, Bld: 113 mg/dL — ABNORMAL HIGH (ref 70–99)
Potassium: 3.7 mmol/L (ref 3.5–5.1)
Sodium: 140 mmol/L (ref 135–145)

## 2021-02-19 LAB — GLUCOSE, CAPILLARY: Glucose-Capillary: 111 mg/dL — ABNORMAL HIGH (ref 70–99)

## 2021-02-19 NOTE — Progress Notes (Signed)
Progress Note  Patient Name: Jill Jenkins Date of Encounter: 02/19/2021  [facility] Cardiologist: Skeet Latch, MD (new)  Subjective   Not feeling great today.  No specific complaints. She ambulated and showered and it was tiring but no chest pain.  She still has vague chest discomfort.  Inpatient Medications    Scheduled Meds: . colchicine  0.6 mg Oral Daily  . enoxaparin (LOVENOX) injection  40 mg Subcutaneous Q24H  . ibuprofen  800 mg Oral QID  . pantoprazole  40 mg Oral BID  . polyethylene glycol  17 g Oral Daily  . senna-docusate  2 tablet Oral BID  . sodium chloride flush  3 mL Intravenous Q12H  . topiramate  25 mg Oral BID   Continuous Infusions:  PRN Meds: acetaminophen, eletriptan, ketorolac, melatonin, morphine injection, ondansetron (ZOFRAN) IV   Vital Signs    Vitals:   02/18/21 0700 02/18/21 1138 02/18/21 2036 02/19/21 0551  BP: (!) 104/59 126/73 122/73 128/79  Pulse: 67 66 63 (!) 59  Resp: (!) 22 18 18 18   Temp: 98.3 F (36.8 C) 98 F (36.7 C) 97.9 F (36.6 C) 98.1 F (36.7 C)  TempSrc: Oral Oral Oral Oral  SpO2:  100% 100% 99%  Weight:    91.4 kg    Intake/Output Summary (Last 24 hours) at 02/19/2021 0945 Last data filed at 02/18/2021 2000 Gross per 24 hour  Intake 603 ml  Output --  Net 603 ml   Last 3 Weights 02/19/2021 02/18/2021 02/17/2021  Weight (lbs) 201 lb 8 oz 201 lb 15.1 oz 201 lb 11.2 oz  Weight (kg) 91.4 kg 91.6 kg 91.491 kg      Telemetry    Sinus rhythm.  - Personally Reviewed  ECG    N/A- Personally Reviewed  Physical Exam   VS:  BP 128/79 (BP Location: Left Arm)   Pulse (!) 59   Temp 98.1 F (36.7 C) (Oral)   Resp 18   Wt 91.4 kg   SpO2 99%   BMI 28.91 kg/m  , BMI Body mass index is 28.91 kg/m. GENERAL:  Well appearing HEENT: Pupils equal round and reactive, fundi not visualized, oral mucosa unremarkable NECK:  No jugular venous distention, waveform within normal limits, carotid upstroke brisk and  symmetric, no bruits LUNGS:  Clear to auscultation bilaterally HEART:  RRR.  PMI not displaced or sustained,S1 and S2 within normal limits, no S3, no S4, no clicks, no rubs, no murmurs ABD:  Flat, positive bowel sounds normal in frequency in pitch, no bruits, no rebound, no guarding, no midline pulsatile mass, no hepatomegaly, no splenomegaly EXT:  2 plus pulses throughout, no edema, no cyanosis no clubbing SKIN:  No rashes no nodules NEURO:  Cranial nerves II through XII grossly intact, motor grossly intact throughout PSYCH:  Cognitively intact, oriented to person place and time   Labs    High Sensitivity Troponin:   Recent Labs  Lab 02/15/21 1502 02/15/21 1920 02/16/21 0200  TROPONINIHS 3 4 4       Chemistry Recent Labs  Lab 02/15/21 1502 02/15/21 1951 02/16/21 0200 02/16/21 1052  NA 140  --  138  --   K 3.9  --  3.7  --   CL 110  --  109  --   CO2 19*  --  20*  --   GLUCOSE 100*  --  131*  --   BUN 15  --  12  --   CREATININE 1.04*  --  0.93 1.04*  CALCIUM 9.7  --  8.9  --   PROT  --  6.8  --   --   ALBUMIN  --  3.9  --   --   AST  --  24  --   --   ALT  --  38  --   --   ALKPHOS  --  39  --   --   BILITOT  --  0.7  --   --   GFRNONAA >60  --  >60 >60  ANIONGAP 11  --  9  --      Hematology Recent Labs  Lab 02/15/21 1502 02/16/21 0200 02/16/21 1052  WBC 11.5* 13.1* 10.5  RBC 4.76 4.31 4.35  HGB 15.0 13.7 14.0  HCT 46.3* 41.7 41.5  MCV 97.3 96.8 95.4  MCH 31.5 31.8 32.2  MCHC 32.4 32.9 33.7  RDW 12.8 13.0 13.0  PLT 204 186 170    BNPNo results for input(s): BNP, PROBNP in the last 168 hours.   DDimer  Recent Labs  Lab 02/15/21 1951  DDIMER 0.62*     Radiology    MR CARDIAC MORPHOLOGY W WO CONTRAST  Result Date: 02/17/2021 CLINICAL DATA:  Suspected pericardial cyst EXAM: CARDIAC MRI TECHNIQUE: The patient was scanned on a 1.5 Tesla Siemens magnet. A dedicated cardiac coil was used. Functional imaging was done using Fiesta sequences. 2,3, and  4 chamber views were done to assess for RWMA's. Modified Simpson's rule using a short axis stack was used to calculate an ejection fraction on a dedicated work Conservation officer, nature. The patient received 12 cc of Gadavist. After 10 minutes inversion recovery sequences were used to assess for infiltration and scar tissue. CONTRAST:  12 cc  of Gadavist FINDINGS: Left ventricle: -Normal size -Normal wall thickness -Normal systolic function -Normal ECV (25%) -No LGE LV EF:  67% (Normal 56-78%) Absolute volumes: LV EDV: 157mL (Normal 52-141 mL) LV ESV: 62mL (Normal 13-51 mL) LV SV: 35mL (Normal 33-97 mL) CO: 4.7L/min (Normal 2.7-6.0 L/min) Indexed volumes: LV EDV: 11mL/sq-m (Normal 41-81 mL/sq-m) LV ESV: 49mL/sq-m (Normal 12-21 mL/sq-m) LV SV: 46mL/sq-m (Normal 26-56 mL/sq-m) CI: 2.2L/min/sq-m (Normal 1.8-3.8 L/min/sq-m) Right ventricle: Normal size and systolic function RV EF: 31% (Normal 47-80%) Absolute volumes: RV EDV: 137mL (Normal 58-154 mL) RV ESV: 80mL (Normal 12-68 mL) RV SV: 71mL (Normal 35-98 mL) CO: 4.6L/min (Normal 2.7-6 L/min) Indexed volumes: RV EDV: 57mL/sq-m (Normal 48-87 mL/sq-m) RV ESV: 73mL/sq-m (Normal 11-28 mL/sq-m) RV SV: 30mL/sq-m (Normal 27-57 mL/sq-m) CI: 2.2L/min/sq-m (Normal 1.8-3.8 L/min/sq-m) Left atrium: Normal size Right atrium: Normal size Mitral valve: No regurgitation Aortic valve: No regurgitation Tricuspid valve: Trivial regurgitation Pulmonic valve: No regurgitation Aorta: Dilated ascending aorta measuring 87mm Pericardium: Small effusion measuring up to 82mm adjacent to LV lateral wall. There is a 57mm x 5mm x 16mm mass adjacent to the right atrium. Mass is hypointense to myocardium on T1 weighted imaging, hyperintense on T2 weighted imaging, does not suppress with fat saturation, no contrast uptake on first pass perfusion, and no late gaolinium enhancement. This is consistent with a pericardial cyst. IMPRESSION: 1. There is a 6mm x 44mm x 48mm mass adjacent to the right  atrium. Mass is hypointense to myocardium on T1 weighted imaging, hyperintense on T2 weighted imaging, does not suppress with fat saturation, no contrast uptake on first pass perfusion, and no late gadolinium enhancement. This is consistent with a pericardial cyst. 2. Pericardial LGE and hyperenhancement on T2 weighted imaging suggests pericardial  inflammation, consistent with acute pericarditis 3. Small pericardial effusion measuring up to 63mm adjacent to LV lateral wall 4. Normal LV size, wall thickness, and systolic function (EF 73%). No LGE to suggest myocardial scar 5.  Normal RV size and systolic function (EF 53%) 6.  Dilated ascending aorta measuring 79mm Electronically Signed   By: Oswaldo Milian MD   On: 02/17/2021 21:26    Cardiac Studies   Echo 02/16/2021: 1. Left ventricular ejection fraction, by estimation, is 60 to 65%. The  left ventricle has normal function. The left ventricle has no regional  wall motion abnormalities. Left ventricular diastolic parameters were  normal.  2. Right ventricular systolic function is normal. The right ventricular  size is normal. There is normal pulmonary artery systolic pressure.  3. The mitral valve is normal in structure. No evidence of mitral valve  regurgitation. No evidence of mitral stenosis.  4. The aortic valve is tricuspid. Aortic valve regurgitation is not  visualized. No aortic stenosis is present.  5. The inferior vena cava is normal in size with greater than 50%  respiratory variability, suggesting right atrial pressure of 3 mmHg.   Chest CT-A (02/15/2021): IMPRESSION: 1. No pulmonary embolus. 2. No acute intrathoracic abnormality. 3. 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 4. Multiple fluid density lesions within the liver statistically likely represent hepatic cysts.  Cardiac MRI 02/17/2021: IMPRESSION: 1. There is a 54mm x 28mm x 23mm mass adjacent to the right atrium. Mass is hypointense to myocardium on T1  weighted imaging, hyperintense on T2 weighted imaging, does not suppress with fat saturation, no contrast uptake on first pass perfusion, and no late gadolinium enhancement. This is consistent with a pericardial cyst.  2. Pericardial LGE and hyperenhancement on T2 weighted imaging suggests pericardial inflammation, consistent with acute pericarditis  3. Small pericardial effusion measuring up to 8mm adjacent to LV lateral wall  4. Normal LV size, wall thickness, and systolic function (EF 29%). No LGE to suggest myocardial scar  5.  Normal RV size and systolic function (EF 92%)  6.  Dilated ascending aorta measuring 64mm  Patient Profile     Ms. Mourer is a 89F with no cardiac history here with syncope and chest pain.  She was found to have what appears to be a larger pericardial cyst and acute pericarditis.    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  There is no evidence of hemodynamic compromise.  It seems as though her syncope was a vagally mediated in the setting of severe, stabbing chest pain.  Her chest pain has improved significantly with colchicine and ibuprofen.  Pericarditis seen on cardiac MRI and CRP mildly elevated.  May be related to a viral infection a few weeks ago.  She was evaluated by CT surgery and plans to have robotic resection with Dr. Kipp Brood tomorrow.   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  Repeat imaging in 6-12 months.  She does not have hypertension.  # SVT:  Likely related to the pericarditis.  Management as above.     For questions or updates, please contact [place] Please consult [email or web address] for contact info under        Signed, Skeet Latch, MD  02/19/2021, 9:45 AM

## 2021-02-19 NOTE — Progress Notes (Signed)
[facility]  PROGRESS NOTE  Jill Jenkins DOB: 03/27/1966 DOA: 02/15/2021 PCP: Chesley Noon, MD   Brief HPI:   55 year old female with medical history of migraine who came to [facility] with severe exertional chest pain with shortness of breath.  Also had syncopal episode where she lost consciousness for about 3 minutes and also had shaking of her right hand.  D-dimer was elevated, CTA chest was negative for PE but showed pericardial cyst.  Cardiology was consulted, patient started on colchicine and ibuprofen for pericarditis. CT surgery was consulted for draining the pericardial cyst.   Subjective   Patient seen and examined, denies chest pain or shortness of breath.   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 -CT surgery was consulted -Plan for robotic resection of cyst on 02/20/2021   Incidental multiple hepatic cysts -Seen on CTA chest -Patient did travel to [place] in February of this year; concern for amebiasis -We will obtain abdominal ultrasound   Syncope -Resolved -Unclear etiology; likely vasovagal -Will need further imaging studies if has recurrent syncope          Scheduled medications:   . colchicine  0.6 mg Oral Daily  . enoxaparin (LOVENOX) injection  40 mg Subcutaneous Q24H  . ibuprofen  800 mg Oral QID  . pantoprazole  40 mg Oral BID  . polyethylene glycol  17 g Oral Daily  . senna-docusate  2 tablet Oral BID  . sodium chloride flush  3 mL Intravenous Q12H  . topiramate  25 mg Oral BID         Data Reviewed:   CBG:  Recent Labs  Lab 02/17/21 0610  02/18/21 0547 02/19/21 0552  GLUCAP 112* 110* 111*    SpO2: 100 %    Vitals:   02/18/21 2036 02/19/21 0551 02/19/21 1154 02/19/21 1200  BP: 122/73 128/79    Pulse: 63 (!) 59    Resp: _0 Temp: 97.9 F (36.6 C) 98.1 F (36.7 C) 98 F (36.7 C)   TempSrc: Oral Oral Oral   SpO2: 100% 99% 100%   Weight:  91.4 kg       Intake/Output Summary (Last 24 hours) at 02/19/2021 1636 Last data filed at 02/18/2021 2000 Gross per 24 hour  Intake 240 ml  Output --  Net 240 ml    06/06 1901 - 06/08 0700 In: 1086 [P.O.:1080; I.V.:6] Out: -   Filed Weights   02/17/21 0210 02/18/21 0540 02/19/21 0551  Weight: 91.5 kg 91.6 kg 91.4 kg    CBC:  Recent Labs  Lab 02/15/21 1502 02/16/21 0200 02/16/21 1052  WBC 11.5* 13.1* 10.5  HGB 15.0 13.7 14.0  HCT 46.3* 41.7 41.5  PLT 204 186 170  MCV 97.3 96.8 95.4  MCH 31.5 31.8 32.2  MCHC 32.4 32.9 33.7  RDW 12.8 13.0 13.0    Complete metabolic panel:  Recent Labs  Lab 02/15/21 1502 02/15/21 1951 02/16/21 0200 02/16/21 0900 02/16/21 1052 02/19/21 1010  NA 140  --  138  --   --  140  K 3.9  --  3.7  --   --  3.7  CL 110  --  109  --   --  113*  CO2 19*  --  20*  --   --  18*  GLUCOSE 100*  --  131*  --   --  113*  BUN 15  --  12  --   --  12  CREATININE 1.04*  --  0.93  --  1.04* 1.00  CALCIUM 9.7  --  8.9  --   --  8.8*  AST  --  24  --   --   --   --   ALT  --  38  --   --   --   --   ALKPHOS  --  39  --   --   --   --   BILITOT  --  0.7  --   --   --   --   ALBUMIN  --  3.9  --   --   --   --   MG  --   --  1.8  --   --   --   CRP  --   --   --  1.4*  --   --   DDIMER  --  0.62*  --   --   --   --   TSH  --   --   --   --  1.907  --     Recent Labs  Lab 02/15/21 1951  LIPASE 31    Recent Labs  Lab 02/15/21 1951 02/15/21 2009 02/16/21 0900  CRP  --   --  1.4*  DDIMER 0.62*  --   --   SARSCOV2NAA  --  NEGATIVE  --      ------------------------------------------------------------------------------------------------------------------ No results for input(s): CHOL, HDL, LDLCALC, TRIG, CHOLHDL, LDLDIRECT in the last 72 hours.  No results found for: HGBA1C ------------------------------------------------------------------------------------------------------------------ No results for input(s): TSH, T4TOTAL, T3FREE, THYROIDAB in the last 72 hours.  Invalid input(s): FREET3 ------------------------------------------------------------------------------------------------------------------ No results for input(s): VITAMINB12, FOLATE, FERRITIN, TIBC, IRON, RETICCTPCT in the last 72 hours.  Coagulation profile No results for input(s): INR, PROTIME in the last 168 hours. No results for input(s): DDIMER in the last 72 hours.  Cardiac Enzymes No results for input(s): CKTOTAL, CKMB, CKMBINDEX, TROPONINI in the last 168 hours.  ------------------------------------------------------------------------------------------------------------------ No results found for: BNP   Antibiotics: Anti-infectives (From admission, onward)   None       Radiology Reports  No results found.    DVT prophylaxis: Lovenox  Code Status: Full code  Family Communication: No family at bedside   Consultants:  Cardiology  CT surgery  Procedures:      Objective    Physical Examination:    General-appears in no acute distress  Heart-S1-S2, regular, no murmur auscultated  Lungs-clear to auscultation bilaterally, no wheezing or crackles auscultated  Abdomen-soft, nontender, no organomegaly  Extremities-no edema in the lower extremities  Neuro-alert, oriented x3, no focal deficit noted   Status is: Inpatient  Dispo: The patient is from: Home              Anticipated d/c is to: Home              Anticipated d/c date is: 02/21/2021              Patient currently not stable for discharge  Barrier to  discharge-ongoing management for pericardial cyst  COVID-19 Labs  No results for input(s): DDIMER, FERRITIN, LDH, CRP in the last 72 hours.  Lab Results  Component Value Date   [place] NEGATIVE 02/15/2021    Microbiology  Recent Results (from the past 240 hour(s))  Resp Panel by RT-PCR (Flu A&B, Covid) Nasopharyngeal Swab     Status: None   Collection Time: 02/15/21  8:09 PM   Specimen: Nasopharyngeal Swab; Nasopharyngeal(NP) swabs in vial transport medium  Result Value Ref Range Status   SARS Coronavirus 2 by RT PCR NEGATIVE NEGATIVE Final    Comment: (NOTE) SARS-CoV-2 target nucleic acids are NOT DETECTED.  The SARS-CoV-2 RNA is generally detectable in upper respiratory specimens during the acute phase of infection. The lowest concentration of SARS-CoV-2 viral copies this assay can detect is 138 copies/mL. A negative result does not preclude SARS-Cov-2 infection and should not be used as the sole basis for treatment or other patient management decisions. A negative result may occur with  improper specimen collection/handling, submission of specimen other than nasopharyngeal swab, presence of viral mutation(s) within the areas targeted by this assay, and inadequate number of viral copies(<138 copies/mL). A negative result must be combined with clinical observations, patient history, and epidemiological information. The expected result is Negative.  Fact Sheet for Patients:  EntrepreneurPulse.com.au  Fact Sheet for Healthcare Providers:  IncredibleEmployment.be  This test is no t yet approved or cleared by the [place] FDA and  has been authorized for detection and/or diagnosis of SARS-CoV-2 by FDA under an Emergency Use Authorization (EUA). This EUA will remain  in effect (meaning this test can be used) for the duration of the COVID-19 declaration under Section 564(b)(1) of the Act, 21 U.S.C.section 360bbb-3(b)(1), unless the  authorization is terminated  or revoked sooner.       Influenza A by PCR NEGATIVE NEGATIVE Final   Influenza B by PCR NEGATIVE NEGATIVE Final    Comment: (NOTE) The Xpert Xpress SARS-CoV-2/FLU/RSV plus assay is intended as an aid in the diagnosis of influenza from Nasopharyngeal swab specimens and should not be used as a sole basis for treatment. Nasal washings and aspirates are unacceptable for Xpert Xpress SARS-CoV-2/FLU/RSV testing.  Fact Sheet for Patients: EntrepreneurPulse.com.au  Fact Sheet for Healthcare Providers: IncredibleEmployment.be  This test is not yet approved or cleared by the [place] FDA and has been authorized for detection and/or diagnosis of SARS-CoV-2 by FDA under an Emergency Use Authorization (EUA). This EUA will remain in effect (meaning this test can be used) for the duration of the COVID-19 declaration under Section 564(b)(1) of the Act, 21 U.S.C. section 360bbb-3(b)(1), unless the authorization is terminated or revoked.  Performed at [facility], [place] [address], [place], [place] [address]              [facility] If 7PM-7AM, please contact night-coverage at [email or web address], Office  [phone]   02/19/2021, 4:36 PM  LOS: 2 days

## 2021-02-19 NOTE — Progress Notes (Signed)
Physical Therapy Treatment Patient Details Name: Jill Jenkins MRN: [id number] DOB: July 04, 1966 Today's Date: 02/19/2021    History of Present Illness Patient is a 55 y/o female who presents on 02/15/21 with chest pain, SOB and witnessed syncopal episode with + LOC and hand shaking. EEG unremarkable. 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 MRI pending. Workup pending. PMH includes migraines.    PT Comments    Pt tolerates treatment well, ambulating for increased distances compared to evaluation. Pt does demonstrate reduced gait speed and mild instability compared to baseline, however does not require physical assistance currently to ambulate. PT encourages the patient to ambulate out of the room multiple times daily to maintain her current level of function. PT has no PT or DME needs at the time of discharge.  Follow Up Recommendations  No PT follow up;Supervision - Intermittent     Equipment Recommendations  None recommended by PT    Recommendations for Other Services       Precautions / Restrictions Precautions Precautions: Other (comment) Precaution Comments: lightheadedness, watch BP Restrictions Weight Bearing Restrictions: No    Mobility  Bed Mobility Overal bed mobility: Independent                  Transfers Overall transfer level: Independent Equipment used: None Transfers: Sit to/from Stand Sit to Stand: Independent            Ambulation/Gait Ambulation/Gait assistance: Supervision Gait Distance (Feet): 200 Feet Assistive device: None Gait Pattern/deviations: Step-through pattern Gait velocity: reduced Gait velocity interpretation: <1.8 ft/sec, indicate of risk for recurrent falls General Gait Details: pt with slowed step-through gait, PRN use of railing or wall for balance. One minor LOB pt corrects for with stepping strategy   Stairs             Wheelchair Mobility     Modified Rankin (Stroke Patients Only)       Balance Overall balance assessment: Needs assistance Sitting-balance support: No upper extremity supported;Feet supported Sitting balance-Leahy Scale: Normal     Standing balance support: No upper extremity supported Standing balance-Leahy Scale: Good                              Cognition Arousal/Alertness: Awake/alert Behavior During Therapy: WFL for tasks assessed/performed Overall Cognitive Status: Within Functional Limits for tasks assessed                                        Exercises      General Comments General comments (skin integrity, edema, etc.): HR stable, BP pre-mobility 136/90, post-mobility 126/80. Pt reports mild lightheadedness with initial change in position however subsides quickly      Pertinent Vitals/Pain Pain Assessment: 0-10 Pain Score: 2  Pain Location: chest Pain Descriptors / Indicators: Discomfort Pain Intervention(s): Monitored during session    Home Living                      Prior Function            PT Goals (current goals can now be found in the care plan section) Acute Rehab PT Goals Patient Stated Goal: to get better and go home Progress towards PT goals: Progressing toward goals    Frequency    Min  3X/week      PT Plan Current plan remains appropriate    Co-evaluation              AM-PAC PT "6 Clicks" Mobility   Outcome Measure  Help needed turning from your back to your side while in a flat bed without using bedrails?: None Help needed moving from lying on your back to sitting on the side of a flat bed without using bedrails?: None Help needed moving to and from a bed to a chair (including a wheelchair)?: None Help needed standing up from a chair using your arms (e.g., wheelchair or bedside chair)?: None Help needed to walk in hospital room?: A Little Help needed climbing 3-5 steps with a railing? : A Little 6 Click  Score: 22    End of Session   Activity Tolerance: Patient tolerated treatment well Patient left: in chair;with call bell/phone within reach Nurse Communication: Mobility status PT Visit Diagnosis: Pain;Unsteadiness on feet (R26.81);Difficulty in walking, not elsewhere classified (R26.2) Pain - part of body:  (chest)     Time: 4076-8088 PT Time Calculation (min) (ACUTE ONLY): 16 min  Charges:  $Therapeutic Activity: 8-22 mins                     Zenaida Niece, PT, DPT Acute Rehabilitation Pager: [phone]    Zenaida Niece 02/19/2021, 11:01 AM

## 2021-02-19 NOTE — Progress Notes (Signed)
     [address]Suite [address]       [place] [address]             [phone]       We discussed surgical resection of the pericardial cyst.  She is agreeable to proceed.  She is scheduled for tomorrow afternoon.  Jill Jenkins

## 2021-02-20 ENCOUNTER — Inpatient Hospital Stay (HOSPITAL_COMMUNITY): Payer: BC Managed Care – PPO

## 2021-02-20 ENCOUNTER — Encounter (HOSPITAL_COMMUNITY): Payer: Self-pay | Admitting: Cardiovascular Disease

## 2021-02-20 ENCOUNTER — Encounter (HOSPITAL_COMMUNITY)
Admission: EM | Disposition: A | Discharge status: Home / Self Care | Payer: Self-pay | Source: Home / Self Care | Attending: Cardiovascular Disease

## 2021-02-20 DIAGNOSIS — I318 Other specified diseases of pericardium: Secondary | ICD-10-CM

## 2021-02-20 HISTORY — PX: INTERCOSTAL NERVE BLOCK: SHX5021

## 2021-02-20 HISTORY — PX: XI ROBOTIC ASSISTED PERICARDIAL WINDOW: SHX6870

## 2021-02-20 LAB — GLUCOSE, CAPILLARY
Glucose-Capillary: 116 mg/dL — ABNORMAL HIGH (ref 70–99)
Glucose-Capillary: 145 mg/dL — ABNORMAL HIGH (ref 70–99)

## 2021-02-20 LAB — COMPREHENSIVE METABOLIC PANEL
ALT: 49 U/L — ABNORMAL HIGH (ref 0–44)
AST: 39 U/L (ref 15–41)
Albumin: 2.8 g/dL — ABNORMAL LOW (ref 3.5–5.0)
Alkaline Phosphatase: 60 U/L (ref 38–126)
Anion gap: 7 (ref 5–15)
BUN: 13 mg/dL (ref 6–20)
CO2: 19 mmol/L — ABNORMAL LOW (ref 22–32)
Calcium: 8.5 mg/dL — ABNORMAL LOW (ref 8.9–10.3)
Chloride: 113 mmol/L — ABNORMAL HIGH (ref 98–111)
Creatinine, Ser: 1.07 mg/dL — ABNORMAL HIGH (ref 0.44–1.00)
GFR, Estimated: >60 mL/min (ref >60)
Glucose, Bld: 147 mg/dL — ABNORMAL HIGH (ref 70–99)
Potassium: 4.3 mmol/L (ref 3.5–5.1)
Sodium: 139 mmol/L (ref 135–145)
Total Bilirubin: 0.6 mg/dL (ref 0.3–1.2)
Total Protein: 5.5 g/dL — ABNORMAL LOW (ref 6.5–8.1)

## 2021-02-20 LAB — ABO/RH: ABO/RH(D): B POS

## 2021-02-20 LAB — SURGICAL PCR SCREEN
MRSA, PCR: NEGATIVE
Staphylococcus aureus: NEGATIVE

## 2021-02-20 SURGERY — CREATION, PERICARDIAL WINDOW, ROBOT-ASSISTED
Anesthesia: General | Site: Chest | Laterality: Right

## 2021-02-20 MED ORDER — FENTANYL CITRATE (PF) 250 MCG/5ML IJ SOLN
INTRAMUSCULAR | Status: AC
  Filled 2021-02-20: qty 5

## 2021-02-20 MED ORDER — BUPIVACAINE LIPOSOME 1.3 % IJ SUSP
INTRAMUSCULAR | Status: AC
  Filled 2021-02-20: qty 20

## 2021-02-20 MED ORDER — ONDANSETRON HCL 4 MG/2ML IJ SOLN: 4.0000 mg | Freq: Once | INTRAMUSCULAR | Status: DC | PRN

## 2021-02-20 MED ORDER — LACTATED RINGERS IV SOLN: INTRAVENOUS | Status: DC

## 2021-02-20 MED ORDER — PROPOFOL 10 MG/ML IV BOLUS
INTRAVENOUS | Status: DC | PRN
  Administered 2021-02-20: 120 mg via INTRAVENOUS

## 2021-02-20 MED ORDER — TRAMADOL HCL 50 MG PO TABS: 50.0000 mg | ORAL_TABLET | Freq: Four times a day (QID) | ORAL | Status: DC | PRN

## 2021-02-20 MED ORDER — DEXAMETHASONE SODIUM PHOSPHATE 10 MG/ML IJ SOLN
INTRAMUSCULAR | Status: AC
  Filled 2021-02-20: qty 1

## 2021-02-20 MED ORDER — ACETAMINOPHEN 500 MG PO TABS
1000.0000 mg | ORAL_TABLET | Freq: Four times a day (QID) | ORAL | Status: DC
  Administered 2021-02-20 – 2021-02-21 (×4): 1000 mg via ORAL
  Filled 2021-02-20 (×4): qty 2

## 2021-02-20 MED ORDER — MIDAZOLAM HCL 2 MG/2ML IJ SOLN
INTRAMUSCULAR | Status: AC
  Filled 2021-02-20: qty 2

## 2021-02-20 MED ORDER — CEFAZOLIN SODIUM 1 G IJ SOLR
INTRAMUSCULAR | Status: AC
  Filled 2021-02-20: qty 20

## 2021-02-20 MED ORDER — ONDANSETRON HCL 4 MG/2ML IJ SOLN
INTRAMUSCULAR | Status: DC | PRN
  Administered 2021-02-20: 4 mg via INTRAVENOUS

## 2021-02-20 MED ORDER — MORPHINE SULFATE (PF) 2 MG/ML IV SOLN
1.0000 mg | INTRAVENOUS | Status: DC | PRN
  Administered 2021-02-20 (×2): 1 mg via INTRAVENOUS
  Filled 2021-02-20 (×2): qty 1

## 2021-02-20 MED ORDER — ROCURONIUM BROMIDE 10 MG/ML (PF) SYRINGE
PREFILLED_SYRINGE | INTRAVENOUS | Status: DC | PRN
  Administered 2021-02-20: 20 mg via INTRAVENOUS
  Administered 2021-02-20: 60 mg via INTRAVENOUS

## 2021-02-20 MED ORDER — LUNG SURGERY BOOK
Freq: Once | Status: AC
  Filled 2021-02-20: qty 1

## 2021-02-20 MED ORDER — BUPIVACAINE LIPOSOME 1.3 % IJ SUSP
INTRAMUSCULAR | Status: DC | PRN
  Administered 2021-02-20: 100 mL

## 2021-02-20 MED ORDER — FENTANYL CITRATE (PF) 250 MCG/5ML IJ SOLN
INTRAMUSCULAR | Status: DC | PRN
  Administered 2021-02-20 (×2): 100 ug via INTRAVENOUS

## 2021-02-20 MED ORDER — PROPOFOL 10 MG/ML IV BOLUS
INTRAVENOUS | Status: AC
  Filled 2021-02-20: qty 20

## 2021-02-20 MED ORDER — SUGAMMADEX SODIUM 200 MG/2ML IV SOLN
INTRAVENOUS | Status: DC | PRN
  Administered 2021-02-20: 200 mg via INTRAVENOUS

## 2021-02-20 MED ORDER — ENOXAPARIN SODIUM 40 MG/0.4ML IJ SOSY
40.0000 mg | PREFILLED_SYRINGE | Freq: Every day | INTRAMUSCULAR | Status: DC
  Administered 2021-02-21: 40 mg via SUBCUTANEOUS
  Filled 2021-02-20: qty 0.4

## 2021-02-20 MED ORDER — BISACODYL 5 MG PO TBEC
10.0000 mg | DELAYED_RELEASE_TABLET | Freq: Every day | ORAL | Status: DC
  Administered 2021-02-20: 10 mg via ORAL
  Filled 2021-02-20 (×2): qty 2

## 2021-02-20 MED ORDER — 0.9 % SODIUM CHLORIDE (POUR BTL) OPTIME
TOPICAL | Status: DC | PRN
  Administered 2021-02-20: 1000 mL

## 2021-02-20 MED ORDER — FENTANYL CITRATE (PF) 100 MCG/2ML IJ SOLN
25.0000 ug | INTRAMUSCULAR | Status: DC | PRN
  Administered 2021-02-20 (×2): 25 ug via INTRAVENOUS

## 2021-02-20 MED ORDER — CHLORHEXIDINE GLUCONATE 0.12 % MT SOLN
15.0000 mL | Freq: Once | OROMUCOSAL | Status: AC
  Administered 2021-02-20: 15 mL via OROMUCOSAL

## 2021-02-20 MED ORDER — ACETAMINOPHEN 160 MG/5ML PO SOLN: 1000.0000 mg | Freq: Four times a day (QID) | ORAL | Status: DC

## 2021-02-20 MED ORDER — FENTANYL CITRATE (PF) 100 MCG/2ML IJ SOLN: 100.0000 ug | Freq: Once | INTRAMUSCULAR | Status: AC

## 2021-02-20 MED ORDER — MUPIROCIN 2 % EX OINT
1.0000 "application " | TOPICAL_OINTMENT | Freq: Two times a day (BID) | CUTANEOUS | Status: DC
  Administered 2021-02-20: 1 via NASAL
  Filled 2021-02-20: qty 22

## 2021-02-20 MED ORDER — SENNOSIDES-DOCUSATE SODIUM 8.6-50 MG PO TABS
1.0000 | ORAL_TABLET | Freq: Every day | ORAL | Status: DC
  Administered 2021-02-20: 1 via ORAL
  Filled 2021-02-20: qty 1

## 2021-02-20 MED ORDER — PHENYLEPHRINE HCL-NACL 10-0.9 MG/250ML-% IV SOLN
INTRAVENOUS | Status: DC | PRN
  Administered 2021-02-20: 15 ug/min via INTRAVENOUS

## 2021-02-20 MED ORDER — LACTATED RINGERS IV SOLN: INTRAVENOUS | Status: DC | PRN

## 2021-02-20 MED ORDER — BUPIVACAINE HCL (PF) 0.5 % IJ SOLN
INTRAMUSCULAR | Status: AC
  Filled 2021-02-20: qty 30

## 2021-02-20 MED ORDER — INSULIN ASPART 100 UNIT/ML IJ SOLN
0.0000 [IU] | Freq: Three times a day (TID) | INTRAMUSCULAR | Status: DC
  Administered 2021-02-20: 2 [IU] via SUBCUTANEOUS

## 2021-02-20 MED ORDER — OXYCODONE HCL 5 MG/5ML PO SOLN
5.0000 mg | Freq: Once | ORAL | Status: DC | PRN
Start: 2021-02-20 — End: 2021-02-20

## 2021-02-20 MED ORDER — ROCURONIUM BROMIDE 10 MG/ML (PF) SYRINGE
PREFILLED_SYRINGE | INTRAVENOUS | Status: AC
  Filled 2021-02-20: qty 10

## 2021-02-20 MED ORDER — MIDAZOLAM HCL 2 MG/2ML IJ SOLN
INTRAMUSCULAR | Status: AC
  Administered 2021-02-20: 2 mg via INTRAVENOUS
  Filled 2021-02-20: qty 2

## 2021-02-20 MED ORDER — LIDOCAINE 2% (20 MG/ML) 5 ML SYRINGE
INTRAMUSCULAR | Status: AC
  Filled 2021-02-20: qty 5

## 2021-02-20 MED ORDER — PHENYLEPHRINE 40 MCG/ML (10ML) SYRINGE FOR IV PUSH (FOR BLOOD PRESSURE SUPPORT)
PREFILLED_SYRINGE | INTRAVENOUS | Status: DC | PRN
  Administered 2021-02-20: 120 ug via INTRAVENOUS

## 2021-02-20 MED ORDER — ONDANSETRON HCL 4 MG/2ML IJ SOLN: 4.0000 mg | Freq: Four times a day (QID) | INTRAMUSCULAR | Status: DC | PRN

## 2021-02-20 MED ORDER — ORAL CARE MOUTH RINSE: 15.0000 mL | Freq: Once | OROMUCOSAL | Status: AC

## 2021-02-20 MED ORDER — DEXMEDETOMIDINE (PRECEDEX) IN NS 20 MCG/5ML (4 MCG/ML) IV SYRINGE
PREFILLED_SYRINGE | INTRAVENOUS | Status: DC | PRN
  Administered 2021-02-20: 8 ug via INTRAVENOUS
  Administered 2021-02-20: 12 ug via INTRAVENOUS
  Administered 2021-02-20: 20 ug via INTRAVENOUS

## 2021-02-20 MED ORDER — CHLORHEXIDINE GLUCONATE CLOTH 2 % EX PADS
6.0000 | MEDICATED_PAD | Freq: Every day | CUTANEOUS | Status: DC
  Administered 2021-02-21: 6 via TOPICAL

## 2021-02-20 MED ORDER — FENTANYL CITRATE (PF) 100 MCG/2ML IJ SOLN
INTRAMUSCULAR | Status: AC
  Administered 2021-02-20: 25 ug via INTRAVENOUS
  Filled 2021-02-20: qty 2

## 2021-02-20 MED ORDER — KETOROLAC TROMETHAMINE 30 MG/ML IJ SOLN
30.0000 mg | Freq: Four times a day (QID) | INTRAMUSCULAR | Status: DC
  Administered 2021-02-20 – 2021-02-21 (×4): 30 mg via INTRAVENOUS
  Filled 2021-02-20 (×4): qty 1

## 2021-02-20 MED ORDER — KETOROLAC TROMETHAMINE 30 MG/ML IJ SOLN
INTRAMUSCULAR | Status: DC | PRN
  Administered 2021-02-20: 30 mg via INTRAVENOUS

## 2021-02-20 MED ORDER — FENTANYL CITRATE (PF) 100 MCG/2ML IJ SOLN
INTRAMUSCULAR | Status: AC
  Administered 2021-02-20: 100 ug via INTRAVENOUS
  Filled 2021-02-20: qty 2

## 2021-02-20 MED ORDER — CHLORHEXIDINE GLUCONATE 0.12 % MT SOLN
OROMUCOSAL | Status: AC
  Filled 2021-02-20: qty 15

## 2021-02-20 MED ORDER — KETOROLAC TROMETHAMINE 30 MG/ML IJ SOLN
INTRAMUSCULAR | Status: AC
  Filled 2021-02-20: qty 1

## 2021-02-20 MED ORDER — DEXAMETHASONE SODIUM PHOSPHATE 10 MG/ML IJ SOLN
INTRAMUSCULAR | Status: DC | PRN
  Administered 2021-02-20: 10 mg via INTRAVENOUS

## 2021-02-20 MED ORDER — OXYCODONE HCL 5 MG PO TABS: 5.0000 mg | ORAL_TABLET | Freq: Once | ORAL | Status: DC | PRN

## 2021-02-20 MED ORDER — CEFAZOLIN SODIUM-DEXTROSE 2-3 GM-%(50ML) IV SOLR
INTRAVENOUS | Status: DC | PRN
  Administered 2021-02-20: 2 g via INTRAVENOUS

## 2021-02-20 MED ORDER — MIDAZOLAM HCL 2 MG/2ML IJ SOLN: 2.0000 mg | Freq: Once | INTRAMUSCULAR | Status: AC

## 2021-02-20 MED ORDER — CEFAZOLIN SODIUM-DEXTROSE 2-4 GM/100ML-% IV SOLN
2.0000 g | Freq: Three times a day (TID) | INTRAVENOUS | Status: AC
  Administered 2021-02-20 – 2021-02-21 (×2): 2 g via INTRAVENOUS
  Filled 2021-02-20 (×2): qty 100

## 2021-02-20 MED ORDER — ONDANSETRON HCL 4 MG/2ML IJ SOLN
INTRAMUSCULAR | Status: AC
  Filled 2021-02-20: qty 2

## 2021-02-20 SURGICAL SUPPLY — 60 items
BLADE STERNUM SYSTEM 6 (BLADE) IMPLANT
CONNECTOR BLAKE 2:1 CARIO BLK (MISCELLANEOUS) IMPLANT
CONT SPEC 4OZ CLIKSEAL STRL BL (MISCELLANEOUS) ×2 IMPLANT
DEFOGGER SCOPE WARMER CLEARIFY (MISCELLANEOUS) ×3 IMPLANT
DERMABOND ADVANCED (GAUZE/BANDAGES/DRESSINGS) ×1
DERMABOND ADVANCED .7 DNX12 (GAUZE/BANDAGES/DRESSINGS) ×2 IMPLANT
DRAIN CHANNEL 19F RND (DRAIN) IMPLANT
DRAPE ARM DVNC X/XI (DISPOSABLE) ×8 IMPLANT
DRAPE COLUMN DVNC XI (DISPOSABLE) ×2 IMPLANT
DRAPE CV SPLIT W-CLR ANES SCRN (DRAPES) ×3 IMPLANT
DRAPE DA VINCI XI ARM (DISPOSABLE) ×4
DRAPE DA VINCI XI COLUMN (DISPOSABLE) ×1
DRAPE ORTHO SPLIT 77X108 STRL (DRAPES) ×1
DRAPE SURG ORHT 6 SPLT 77X108 (DRAPES) ×2 IMPLANT
DRSG AQUACEL AG ADV 3.5X14 (GAUZE/BANDAGES/DRESSINGS) IMPLANT
ELECT REM PT RETURN 9FT ADLT (ELECTROSURGICAL)
ELECTRODE REM PT RTRN 9FT ADLT (ELECTROSURGICAL) IMPLANT
EVACUATOR SILICONE 100CC (DRAIN) IMPLANT
FELT TEFLON 1X6 (MISCELLANEOUS) IMPLANT
GAUZE SPONGE 4X4 12PLY STRL (GAUZE/BANDAGES/DRESSINGS) ×1 IMPLANT
GLOVE BIO SURGEON STRL SZ7 (GLOVE) ×6 IMPLANT
GOWN STRL REUS W/ TWL LRG LVL3 (GOWN DISPOSABLE) ×2 IMPLANT
GOWN STRL REUS W/ TWL XL LVL3 (GOWN DISPOSABLE) ×2 IMPLANT
GOWN STRL REUS W/TWL LRG LVL3 (GOWN DISPOSABLE) ×1
GOWN STRL REUS W/TWL XL LVL3 (GOWN DISPOSABLE) ×1
HEMOSTAT POWDER SURGIFOAM 1G (HEMOSTASIS) IMPLANT
IRRIGATOR SUCT 8 DISP DVNC XI (IRRIGATION / IRRIGATOR) IMPLANT
IRRIGATOR SUCTION 8MM XI DISP (IRRIGATION / IRRIGATOR) ×1
KIT SUCTION CATH 14FR (SUCTIONS) IMPLANT
NDL HYPO 25GX1X1/2 BEV (NEEDLE) IMPLANT
NEEDLE HYPO 25GX1X1/2 BEV (NEEDLE) ×3 IMPLANT
PACK CHEST (CUSTOM PROCEDURE TRAY) IMPLANT
PAD ARMBOARD 7.5X6 YLW CONV (MISCELLANEOUS) ×6 IMPLANT
PAD ELECT DEFIB RADIOL ZOLL (MISCELLANEOUS) ×3 IMPLANT
SEAL CANN UNIV 5-8 DVNC XI (MISCELLANEOUS) ×6 IMPLANT
SEAL XI 5MM-8MM UNIVERSAL (MISCELLANEOUS) ×4
SET TRI-LUMEN FLTR TB AIRSEAL (TUBING) ×3 IMPLANT
STOPCOCK 4 WAY LG BORE MALE ST (IV SETS) IMPLANT
SUT BONE WAX W31G (SUTURE) IMPLANT
SUT MNCRL AB 3-0 PS2 18 (SUTURE) IMPLANT
SUT PDS AB 1 CTX 36 (SUTURE) IMPLANT
SUT SILK  1 MH (SUTURE) ×1
SUT SILK 1 MH (SUTURE) ×2 IMPLANT
SUT SILK 2 0 SH CR/8 (SUTURE) IMPLANT
SUT STEEL 6MS V (SUTURE) IMPLANT
SUT STEEL SZ 6 DBL 3X14 BALL (SUTURE) IMPLANT
SUT VIC AB 2-0 CT1 27 (SUTURE) ×1
SUT VIC AB 2-0 CT1 TAPERPNT 27 (SUTURE) IMPLANT
SUT VIC AB 2-0 CTX 36 (SUTURE) IMPLANT
SUT VIC AB 3-0 SH 27 (SUTURE) ×1
SUT VIC AB 3-0 SH 27X BRD (SUTURE) ×2 IMPLANT
SUT VICRYL 0 UR6 27IN ABS (SUTURE) ×6 IMPLANT
SYR 50ML LL SCALE MARK (SYRINGE) IMPLANT
SYSTEM RETRIEVAL ANCHOR 8 (MISCELLANEOUS) IMPLANT
SYSTEM SAHARA CHEST DRAIN ATS (WOUND CARE) IMPLANT
TAPE CLOTH 4X10 WHT NS (GAUZE/BANDAGES/DRESSINGS) ×1 IMPLANT
TOWEL GREEN STERILE (TOWEL DISPOSABLE) IMPLANT
TRAP SPECIMEN MUCUS 40CC (MISCELLANEOUS) ×8 IMPLANT
TRAY FOLEY SLVR 16FR TEMP STAT (SET/KITS/TRAYS/PACK) IMPLANT
TUBING EXTENTION W/L.L. (IV SETS) IMPLANT

## 2021-02-20 NOTE — Brief Op Note (Signed)
02/20/2021  3:46 PM  PATIENT:  Jill Jenkins  55 y.o. female  PRE-OPERATIVE DIAGNOSIS:  pericardial cyst  POST-OPERATIVE DIAGNOSIS:  pericardial cyst  PROCEDURE:  Procedure(s): XI ROBOTIC ASSISTED THORACOSCOPY PERICARDIAL CYST RESECTION (Right) INTERCOSTAL NERVE BLOCK (Right)  SURGEON:  Surgeon(s) and Role:    * Lightfoot, Lucile Crater, MD - Primary  PHYSICIAN ASSISTANT:   Nicholes Rough, PA-C   ANESTHESIA:   general  EBL:  25 mL   BLOOD ADMINISTERED:none  DRAINS:  ONE BLAKE DRAIN    LOCAL MEDICATIONS USED:  BUPIVICAINE   SPECIMEN:  Source of Specimen:  PERICARDIAL CYST  DISPOSITION OF SPECIMEN:  PATHOLOGY  COUNTS:  YES   DICTATION: .Dragon Dictation  PLAN OF CARE: Admit to inpatient   PATIENT DISPOSITION:  PACU - hemodynamically stable.   Delay start of Pharmacological VTE agent (>24hrs) due to surgical blood loss or risk of bleeding: no

## 2021-02-20 NOTE — Plan of Care (Signed)
  Problem: Health Behavior/Discharge Planning: Goal: Ability to manage health-related needs will improve Outcome: Progressing   Problem: Education: Goal: Knowledge of General Education information will improve Description: Including pain rating scale, medication(s)/side effects and non-pharmacologic comfort measures Outcome: Progressing   Problem: Health Behavior/Discharge Planning: Goal: Ability to manage health-related needs will improve Outcome: Progressing   Problem: Clinical Measurements: Goal: Ability to maintain clinical measurements within normal limits will improve Outcome: Progressing Goal: Will remain free from infection Outcome: Progressing Goal: Diagnostic test results will improve Outcome: Progressing Goal: Respiratory complications will improve Outcome: Progressing Goal: Cardiovascular complication will be avoided Outcome: Progressing   Problem: Activity: Goal: Risk for activity intolerance will decrease Outcome: Progressing   Problem: Nutrition: Goal: Adequate nutrition will be maintained Outcome: Progressing   Problem: Coping: Goal: Level of anxiety will decrease Outcome: Progressing   Problem: Elimination: Goal: Will not experience complications related to bowel motility Outcome: Progressing Goal: Will not experience complications related to urinary retention Outcome: Progressing   Problem: Pain Managment: Goal: General experience of comfort will improve Outcome: Progressing   Problem: Safety: Goal: Ability to remain free from injury will improve Outcome: Progressing   Problem: Skin Integrity: Goal: Risk for impaired skin integrity will decrease Outcome: Progressing   Problem: Education: Goal: Will demonstrate proper wound care and an understanding of methods to prevent future damage Outcome: Progressing Goal: Knowledge of disease or condition will improve Outcome: Progressing Goal: Knowledge of the prescribed therapeutic regimen will  improve Outcome: Progressing Goal: Individualized Educational Video(s) Outcome: Progressing   Problem: Activity: Goal: Risk for activity intolerance will decrease Outcome: Progressing   Problem: Cardiac: Goal: Will achieve and/or maintain hemodynamic stability Outcome: Progressing   Problem: Clinical Measurements: Goal: Postoperative complications will be avoided or minimized Outcome: Progressing   Problem: Respiratory: Goal: Respiratory status will improve Outcome: Progressing   Problem: Skin Integrity: Goal: Wound healing without signs and symptoms of infection Outcome: Progressing Goal: Risk for impaired skin integrity will decrease Outcome: Progressing   Problem: Urinary Elimination: Goal: Ability to achieve and maintain adequate renal perfusion and functioning will improve Outcome: Progressing

## 2021-02-20 NOTE — Anesthesia Procedure Notes (Signed)
Central Venous Catheter Insertion Performed by: Roberts Gaudy, MD, anesthesiologist Start/End6/05/2021 1:42 PM, 02/20/2021 1:52 AM Patient location: Pre-op. Preanesthetic checklist: patient identified, IV checked, site marked, risks and benefits discussed, surgical consent, monitors and equipment checked, pre-op evaluation and timeout performed Position: supine Hand hygiene performed , maximum sterile barriers used  and Seldinger technique used Catheter size: 8 Fr Total catheter length 17. Central line was placed.Double lumen Procedure performed using ultrasound guided technique. Ultrasound Notes:anatomy identified, needle tip was noted to be adjacent to the nerve/plexus identified and no ultrasound evidence of intravascular and/or intraneural injection Attempts: 1 Following insertion, line sutured, dressing applied and Biopatch. Post procedure assessment: free fluid flow, blood return through all ports and no air  Patient tolerated the procedure well with no immediate complications.

## 2021-02-20 NOTE — Anesthesia Preprocedure Evaluation (Signed)
Anesthesia Evaluation  Patient identified by MRN, date of birth, ID band Patient awake    Reviewed: Allergy & Precautions, NPO status , Patient's Chart, lab work & pertinent test results  Airway Mallampati: II  TM Distance: >3 FB Neck ROM: Full    Dental  (+) Teeth Intact, Dental Advisory Given   Pulmonary    breath sounds clear to auscultation       Cardiovascular  Rhythm:Regular Rate:Normal     Neuro/Psych    GI/Hepatic   Endo/Other    Renal/GU      Musculoskeletal   Abdominal   Peds  Hematology   Anesthesia Other Findings   Reproductive/Obstetrics                             Anesthesia Physical Anesthesia Plan  ASA: 3  Anesthesia Plan: General   Post-op Pain Management:    Induction: Intravenous  PONV Risk Score and Plan: Ondansetron and Dexamethasone  Airway Management Planned: Double Lumen EBT  Additional Equipment: CVP  Intra-op Plan:   Post-operative Plan: Extubation in OR  Informed Consent: I have reviewed the patients History and Physical, chart, labs and discussed the procedure including the risks, benefits and alternatives for the proposed anesthesia with the patient or authorized representative who has indicated his/her understanding and acceptance.     Dental advisory given  Plan Discussed with: CRNA and Anesthesiologist  Anesthesia Plan Comments:         Anesthesia Quick Evaluation

## 2021-02-20 NOTE — Plan of Care (Signed)
  Problem: Health Behavior/Discharge Planning: Goal: Ability to manage health-related needs will improve Outcome: Progressing   Problem: Education: Goal: Knowledge of General Education information will improve Description: Including pain rating scale, medication(s)/side effects and non-pharmacologic comfort measures Outcome: Progressing   Problem: Clinical Measurements: Goal: Ability to maintain clinical measurements within normal limits will improve Outcome: Progressing   Problem: Clinical Measurements: Goal: Diagnostic test results will improve Outcome: Progressing   Problem: Activity: Goal: Risk for activity intolerance will decrease Outcome: Progressing   Problem: Nutrition: Goal: Adequate nutrition will be maintained Outcome: Progressing   Problem: Pain Managment: Goal: General experience of comfort will improve Outcome: Progressing   Problem: Skin Integrity: Goal: Risk for impaired skin integrity will decrease Outcome: Progressing   Problem: Education: Goal: Will demonstrate proper wound care and an understanding of methods to prevent future damage Outcome: Progressing   Problem: Education: Goal: Knowledge of disease or condition will improve Outcome: Progressing   Problem: Education: Goal: Knowledge of the prescribed therapeutic regimen will improve Outcome: Progressing   Problem: Clinical Measurements: Goal: Postoperative complications will be avoided or minimized Outcome: Progressing   Problem: Skin Integrity: Goal: Wound healing without signs and symptoms of infection Outcome: Progressing

## 2021-02-20 NOTE — Progress Notes (Signed)
[facility]  PROGRESS NOTE  Jill Jenkins EZM:[id number] DOB: [birth year] DOA: 02/15/2021 PCP: Chesley Noon, MD   Brief HPI:   55 year old female with medical history of migraine who came to [facility] with severe exertional chest pain with shortness of breath.  Also had syncopal episode where she lost consciousness for about 3 minutes and also had shaking of her right hand.  D-dimer was elevated, CTA chest was negative for PE but showed pericardial cyst.  Cardiology was consulted, patient started on colchicine and ibuprofen for pericarditis. CT surgery was consulted for draining the pericardial cyst.   Subjective   Patient seen and examined, abdominal ultrasound done yesterday shows multiple cysts in the liver.  LFTs are normal.   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 -CT surgery was consulted -Plan for robotic resection of cyst on 02/20/2021   Incidental multiple hepatic cysts -Seen on CTA chest -Abdominal ultrasound shows multiple cysts in the liver -LFTs are normal -Called and discussed with [facility]; outpatient appointment will be set up   Syncope -Resolved -Unclear etiology; likely vasovagal -EEG unremarkable for epileptiform discharges -Will need further imaging studies if has recurrent syncope   History of migraine -Continue Topamax    Scheduled medications:    colchicine  0.6 mg Oral Daily   enoxaparin (LOVENOX) injection  40 mg Subcutaneous Q24H   ibuprofen  800 mg Oral QID   mupirocin ointment  1 application Nasal BID   pantoprazole  40 mg Oral BID   polyethylene glycol  17 g Oral  Daily   senna-docusate  2 tablet Oral BID   sodium chloride flush  3 mL Intravenous Q12H   topiramate  25 mg Oral BID         Data Reviewed:   CBG:  Recent Labs  Lab 02/17/21 0610 02/18/21 0547 02/19/21 0552 02/20/21 0611  GLUCAP 112* 110* 111* 116*    SpO2: 98 %    Vitals:   02/19/21 1154 02/19/21 1200 02/19/21 2043 02/20/21 0456  BP:   115/72 132/80  Pulse:   68 68  Resp:  _0 Temp: 98 F (36.7 C)  97.8 F (36.6 C) 98.5 F (36.9 C)  TempSrc: Oral  Oral Oral  SpO2: 100%  98% 98%  Weight:    91.4 kg     Intake/Output Summary (Last 24 hours) at 02/20/2021 1103 Last data filed at 02/19/2021 1801 Gross per 24 hour  Intake 240 ml  Output --  Net 240 ml    06/07 1901 - 06/09 0700 In: 480 [P.O.:480] Out: -   Filed Weights   02/18/21 0540 02/19/21 0551 02/20/21 0456  Weight: 91.6 kg 91.4 kg 91.4 kg    CBC:  Recent Labs  Lab 02/15/21 1502 02/16/21 0200 02/16/21 1052  WBC 11.5* 13.1* 10.5  HGB 15.0 13.7 14.0  HCT 46.3* 41.7 41.5  PLT 204 186 170  MCV 97.3 96.8 95.4  MCH 31.5 31.8 32.2  MCHC 32.4 32.9 33.7  RDW 12.8 13.0 13.0    Complete metabolic panel:  Recent Labs  Lab 02/15/21 1502 02/15/21 1951 02/16/21 0200 02/16/21 0900 02/16/21 1052 02/19/21 1010 02/20/21  0259  NA 140  --  138  --   --  140 139  K 3.9  --  3.7  --   --  3.7 4.3  CL 110  --  109  --   --  113* 113*  CO2 19*  --  20*  --   --  18* 19*  GLUCOSE 100*  --  131*  --   --  113* 147*  BUN 15  --  12  --   --  12 13  CREATININE 1.04*  --  0.93  --  1.04* 1.00 1.07*  CALCIUM 9.7  --  8.9  --   --  8.8* 8.5*  AST  --  24  --   --   --   --  39  ALT  --  38  --   --   --   --  49*  ALKPHOS  --  39  --   --   --   --  60  BILITOT  --  0.7  --   --   --   --  0.6  ALBUMIN  --  3.9  --   --   --   --  2.8*  MG  --   --  1.8  --   --   --   --   CRP  --   --   --  1.4*  --   --   --   DDIMER  --  0.62*  --   --   --   --   --   TSH  --   --   --   --  1.907  --    --     Recent Labs  Lab 02/15/21 1951  LIPASE 31    Recent Labs  Lab 02/15/21 1951 02/15/21 2009 02/16/21 0900  CRP  --   --  1.4*  DDIMER 0.62*  --   --   SARSCOV2NAA  --  NEGATIVE  --     ------------------------------------------------------------------------------------------------------------------ No results for input(s): CHOL, HDL, LDLCALC, TRIG, CHOLHDL, LDLDIRECT in the last 72 hours.  No results found for: HGBA1C ------------------------------------------------------------------------------------------------------------------ No results for input(s): TSH, T4TOTAL, T3FREE, THYROIDAB in the last 72 hours.  Invalid input(s): FREET3 ------------------------------------------------------------------------------------------------------------------ No results for input(s): VITAMINB12, FOLATE, FERRITIN, TIBC, IRON, RETICCTPCT in the last 72 hours.  Coagulation profile No results for input(s): INR, PROTIME in the last 168 hours. No results for input(s): DDIMER in the last 72 hours.  Cardiac Enzymes No results for input(s): CKTOTAL, CKMB, CKMBINDEX, TROPONINI in the last 168 hours.  ------------------------------------------------------------------------------------------------------------------ No results found for: BNP   Antibiotics: Anti-infectives (From admission, onward)    None        Radiology Reports  US Abdomen Complete  Result Date: 02/19/2021 CLINICAL DATA:  Liver cyst. EXAM: ABDOMEN ULTRASOUND COMPLETE COMPARISON:  None. FINDINGS: Gallbladder: A 5 mm nonshadowing echogenic focus is seen along the gallbladder wall. No gallstones or wall thickening visualized (2.8 mm). No sonographic Murphy sign noted by sonographer. Common bile duct: Diameter: 3.8 mm Liver: A 3.3 cm x 2.4 cm x 2.7 cm hypoechoic area is seen within the right lobe of the liver. Additional hypoechoic areas are seen within the left lobe. The largest measures approximately 3.5 cm x 2.8 cm x  2.6 cm. No abnormal flow is seen within these regions on color Doppler evaluation. Within normal limits in parenchymal echogenicity. Portal vein is patent on color Doppler  imaging with normal direction of blood flow towards the liver. IVC: No abnormality visualized. Pancreas: Visualized portion unremarkable. Spleen: Size (7.9 cm) and appearance within normal limits. Right Kidney: Length: 11.2 cm. Echogenicity within normal limits. No mass or hydronephrosis visualized. Left Kidney: Length: 12.0 cm. Echogenicity within normal limits. A dromedary hump is suspected. No mass or hydronephrosis visualized. Abdominal aorta: No aneurysm visualized (2.4 cm in AP diameter). Other findings: The study is limited secondary to patient motion. Of incidental note is the presence of bilateral pleural effusions. IMPRESSION: 1. Multiple simple hepatic cysts. 2. 5 mm gallbladder polyp. This is likely benign in nature. No additional evaluation or follow-up is recommended. This recommendation follows ACR consensus guidelines: White Paper of the ACR Incidental Findings Committee II on Gallbladder and Biliary Findings. J Am Coll Radiol 2013:;10:953-956. 3. Bilateral pleural effusions. Electronically Signed   By: Virgina Norfolk M.D.   On: 02/19/2021 17:49      DVT prophylaxis: Lovenox  Code Status: Full code  Family Communication: No family at bedside   Consultants: Cardiology CT surgery  Procedures:     Objective    Physical Examination:  General-appears in no acute distress Heart-S1-S2, regular, no murmur auscultated Lungs-clear to auscultation bilaterally, no wheezing or crackles auscultated Abdomen-soft, nontender, no organomegaly Extremities-no edema in the lower extremities Neuro-alert, oriented x3, no focal deficit noted   Status is: Inpatient  Dispo: The patient is from: Home              Anticipated d/c is to: Home              Anticipated d/c date is: 02/21/2021              Patient currently  not stable for discharge  Barrier to discharge-ongoing management for pericardial cyst  COVID-19 Labs  No results for input(s): DDIMER, FERRITIN, LDH, CRP in the last 72 hours.  Lab Results  Component Value Date   [place] NEGATIVE 02/15/2021    Microbiology  Recent Results (from the past 240 hour(s))  Resp Panel by RT-PCR (Flu A&B, Covid) Nasopharyngeal Swab     Status: None   Collection Time: 02/15/21  8:09 PM   Specimen: Nasopharyngeal Swab; Nasopharyngeal(NP) swabs in vial transport medium  Result Value Ref Range Status   SARS Coronavirus 2 by RT PCR NEGATIVE NEGATIVE Final    Comment: (NOTE) SARS-CoV-2 target nucleic acids are NOT DETECTED.  The SARS-CoV-2 RNA is generally detectable in upper respiratory specimens during the acute phase of infection. The lowest concentration of SARS-CoV-2 viral copies this assay can detect is 138 copies/mL. A negative result does not preclude SARS-Cov-2 infection and should not be used as the sole basis for treatment or other patient management decisions. A negative result may occur with  improper specimen collection/handling, submission of specimen other than nasopharyngeal swab, presence of viral mutation(s) within the areas targeted by this assay, and inadequate number of viral copies(<138 copies/mL). A negative result must be combined with clinical observations, patient history, and epidemiological information. The expected result is Negative.  Fact Sheet for Patients:  EntrepreneurPulse.com.au  Fact Sheet for Healthcare Providers:  IncredibleEmployment.be  This test is no t yet approved or cleared by the [place] FDA and  has been authorized for detection and/or diagnosis of SARS-CoV-2 by FDA under an Emergency Use Authorization (EUA). This EUA will remain  in effect (meaning this test can be used) for the duration of the COVID-19 declaration under Section 564(b)(1) of the Act,  21 U.S.C.section  360bbb-3(b)(1), unless the authorization is terminated  or revoked sooner.       Influenza A by PCR NEGATIVE NEGATIVE Final   Influenza B by PCR NEGATIVE NEGATIVE Final    Comment: (NOTE) The Xpert Xpress SARS-CoV-2/FLU/RSV plus assay is intended as an aid in the diagnosis of influenza from Nasopharyngeal swab specimens and should not be used as a sole basis for treatment. Nasal washings and aspirates are unacceptable for Xpert Xpress SARS-CoV-2/FLU/RSV testing.  Fact Sheet for Patients: EntrepreneurPulse.com.au  Fact Sheet for Healthcare Providers: IncredibleEmployment.be  This test is not yet approved or cleared by the [place] FDA and has been authorized for detection and/or diagnosis of SARS-CoV-2 by FDA under an Emergency Use Authorization (EUA). This EUA will remain in effect (meaning this test can be used) for the duration of the COVID-19 declaration under Section 564(b)(1) of the Act, 21 U.S.C. section 360bbb-3(b)(1), unless the authorization is terminated or revoked.  Performed at [facility], [place] [address], [place], [place] [address]   Surgical PCR screen     Status: None   Collection Time: 02/20/21  7:34 AM   Specimen: Nasal Mucosa; Nasal Swab  Result Value Ref Range Status   MRSA, PCR NEGATIVE NEGATIVE Final   Staphylococcus aureus NEGATIVE NEGATIVE Final    Comment: (NOTE) The Xpert SA Assay (FDA approved for NASAL specimens in patients 75 years of age and older), is one component of a comprehensive surveillance program. It is not intended to diagnose infection nor to guide or monitor treatment. Performed at [facility], [place] [address] [place], [place] [address]              [facility] If 7PM-7AM, please contact night-coverage at [email or web address], Office  [phone]   02/20/2021, 11:03 AM  LOS: 3 days

## 2021-02-20 NOTE — Progress Notes (Addendum)
Progress Note  Patient Name: Jill Jenkins Date of Encounter: 02/20/2021  [facility] Cardiologist: New to Dr Oval Linsey   Subjective   Patient states she still has chest pain, but overall improved from admission. She states she is ready for surgery today. She had BM yesterday, had to strain hard and noted minor blood on the tissue. She denied any melena.   Inpatient Medications    Scheduled Meds:  colchicine  0.6 mg Oral Daily   enoxaparin (LOVENOX) injection  40 mg Subcutaneous Q24H   ibuprofen  800 mg Oral QID   mupirocin ointment  1 application Nasal BID   pantoprazole  40 mg Oral BID   polyethylene glycol  17 g Oral Daily   senna-docusate  2 tablet Oral BID   sodium chloride flush  3 mL Intravenous Q12H   topiramate  25 mg Oral BID   Continuous Infusions:  PRN Meds: acetaminophen, eletriptan, ketorolac, melatonin, morphine injection, ondansetron (ZOFRAN) IV   Vital Signs    Vitals:   02/19/21 1154 02/19/21 1200 02/19/21 2043 02/20/21 0456  BP:   115/72 132/80  Pulse:   68 68  Resp:  _0 Temp: 98 F (36.7 C)  97.8 F (36.6 C) 98.5 F (36.9 C)  TempSrc: Oral  Oral Oral  SpO2: 100%  98% 98%  Weight:    91.4 kg    Intake/Output Summary (Last 24 hours) at 02/20/2021 0936 Last data filed at 02/19/2021 1801 Gross per 24 hour  Intake 240 ml  Output --  Net 240 ml    Last 3 Weights 02/20/2021 02/19/2021 02/18/2021  Weight (lbs) 201 lb 8 oz 201 lb 8 oz 201 lb 15.1 oz  Weight (kg) 91.4 kg 91.4 kg 91.6 kg      Telemetry    Sinus rhythm with ventricular rate of 60-70s - Personally Reviewed  ECG    New new tracing today - Personally Reviewed  Physical Exam   GEN: No acute distress.   Neck: No JVD Cardiac: RRR, no murmurs, rubs, or gallops. No friction rub noted  Respiratory: Clear to auscultation bilaterally. On room air.  GI: Soft, nontender, non-distended  MS: Trace BLE edema; No deformity. Neuro:  Alert and oriented x3, follow commands appropriately   Psych: Normal affect   Labs    High Sensitivity Troponin:   Recent Labs  Lab 02/15/21 1502 02/15/21 1920 02/16/21 0200  TROPONINIHS _1 Chemistry Recent Labs  Lab 02/15/21 1951 02/16/21 0200 02/16/21 1052 02/19/21 1010 02/20/21 0259  NA  --  138  --  140 139  K  --  3.7  --  3.7 4.3  CL  --  109  --  113* 113*  CO2  --  20*  --  18* 19*  GLUCOSE  --  131*  --  113* 147*  BUN  --  12  --  12 13  CREATININE  --  0.93 1.04* 1.00 1.07*  CALCIUM  --  8.9  --  8.8* 8.5*  PROT 6.8  --   --   --  5.5*  ALBUMIN 3.9  --   --   --  2.8*  AST 24  --   --   --  39  ALT 38  --   --   --  49*  ALKPHOS 39  --   --   --  60  BILITOT 0.7  --   --   --  0.6  GFRNONAA  --  >60 >60 >60 >60  ANIONGAP  --  9  --  9 7      Hematology Recent Labs  Lab 02/15/21 1502 02/16/21 0200 02/16/21 1052  WBC 11.5* 13.1* 10.5  RBC 4.76 4.31 4.35  HGB 15.0 13.7 14.0  HCT 46.3* 41.7 41.5  MCV 97.3 96.8 95.4  MCH 31.5 31.8 32.2  MCHC 32.4 32.9 33.7  RDW 12.8 13.0 13.0  PLT 204 186 170     BNPNo results for input(s): BNP, PROBNP in the last 168 hours.   DDimer  Recent Labs  Lab 02/15/21 1951  DDIMER 0.62*      Radiology    US Abdomen Complete  Result Date: 02/19/2021 CLINICAL DATA:  Liver cyst. EXAM: ABDOMEN ULTRASOUND COMPLETE COMPARISON:  None. FINDINGS: Gallbladder: A 5 mm nonshadowing echogenic focus is seen along the gallbladder wall. No gallstones or wall thickening visualized (2.8 mm). No sonographic Murphy sign noted by sonographer. Common bile duct: Diameter: 3.8 mm Liver: A 3.3 cm x 2.4 cm x 2.7 cm hypoechoic area is seen within the right lobe of the liver. Additional hypoechoic areas are seen within the left lobe. The largest measures approximately 3.5 cm x 2.8 cm x 2.6 cm. No abnormal flow is seen within these regions on color Doppler evaluation. Within normal limits in parenchymal echogenicity. Portal vein is patent on color Doppler imaging with normal direction  of blood flow towards the liver. IVC: No abnormality visualized. Pancreas: Visualized portion unremarkable. Spleen: Size (7.9 cm) and appearance within normal limits. Right Kidney: Length: 11.2 cm. Echogenicity within normal limits. No mass or hydronephrosis visualized. Left Kidney: Length: 12.0 cm. Echogenicity within normal limits. A dromedary hump is suspected. No mass or hydronephrosis visualized. Abdominal aorta: No aneurysm visualized (2.4 cm in AP diameter). Other findings: The study is limited secondary to patient motion. Of incidental note is the presence of bilateral pleural effusions. IMPRESSION: 1. Multiple simple hepatic cysts. 2. 5 mm gallbladder polyp. This is likely benign in nature. No additional evaluation or follow-up is recommended. This recommendation follows ACR consensus guidelines: White Paper of the ACR Incidental Findings Committee II on Gallbladder and Biliary Findings. J Am Coll Radiol 2013:;10:953-956. 3. Bilateral pleural effusions. Electronically Signed   By: Virgina Norfolk M.D.   On: 02/19/2021 17:49     Cardiac Studies   Echo 02/16/2021:  1. Left ventricular ejection fraction, by estimation, is 60 to 65%. The  left ventricle has normal function. The left ventricle has no regional  wall motion abnormalities. Left ventricular diastolic parameters were  normal.   2. Right ventricular systolic function is normal. The right ventricular  size is normal. There is normal pulmonary artery systolic pressure.   3. The mitral valve is normal in structure. No evidence of mitral valve  regurgitation. No evidence of mitral stenosis.   4. The aortic valve is tricuspid. Aortic valve regurgitation is not  visualized. No aortic stenosis is present.   5. The inferior vena cava is normal in size with greater than 50%  respiratory variability, suggesting right atrial pressure of 3 mmHg.   Chest CT-A (02/15/2021): IMPRESSION: 1. No pulmonary embolus. 2. No acute intrathoracic  abnormality. 3. 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 4. Multiple fluid density lesions within the liver statistically likely represent hepatic cysts.   Cardiac MRI 02/17/2021: IMPRESSION: 1. There is a 21mm x 56mm x 34mm mass adjacent to the right atrium. Mass is hypointense to myocardium on T1 weighted  imaging, hyperintense on T2 weighted imaging, does not suppress with fat saturation, no contrast uptake on first pass perfusion, and no late gadolinium enhancement. This is consistent with a pericardial cyst.   2. Pericardial LGE and hyperenhancement on T2 weighted imaging suggests pericardial inflammation, consistent with acute pericarditis   3. Small pericardial effusion measuring up to 28m adjacent to LV lateral wall   4. Normal LV size, wall thickness, and systolic function (EF 697%. No LGE to suggest myocardial scar   5.  Normal RV size and systolic function (EF 635%   6.  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 female with PMH of migraine, insomnia, no known cardiac history, cardiology is following  for chest pain /syncope and pericardial cyst. She was found to have what appears to be a larger pericardial cyst and acute pericarditis.   Assessment & Plan    Pericardial cyst Acute pericarditis  - presented with episodes of pleuritic chest pain, syncope  - Hs trop  Negative x3 - EKG without acute ischemic changes - CTA negative for PE , Incidentally noted likely 4.3 cm pericardial cyst. - ESR WNL, CRP mildly elevated 1.4  - Echo 02/16/21 showed EF 60-65%, no RWMA, RV normal, normal PASP and RVSP, no significant valvular disease  - Cardaic MRI 02/17/21 showed 4033m [id number] adjacent to the right atrium,consistent with a pericardial cyst, there is acute pericarditis, Small pericardial effusion measuring up to 7mm a53mcent to LV lateral wall - no clinical evidence of cardiac tamponade, pericarditis likely from viral illness  -  started on Colchicine 0.6mg da49m, ibuprofen 800mg QI64mRN toradol and morphine, and PPI , chest pain symptoms are improving, monitor for GI bleed/PUD sx - CT surgery consulted, recommend R RATS for pericardial cyst removal today   Syncope  - presented with dizziness and chest pain prior to syncope event - Echo as above - EEG negative for seizures or epileptiform discharges  - telemetry monitor without acute events noted  - cardiac MRI as above - likely vagally mediated in the setting of severe, stabbing chest pain  Dilated ascending aorta  - 40 mm on cardiac MRI, outpatient surveillance every 6-12 month   Hx of SVT - resolved , due to pericarditis   Leukocytosis, resolved  Hepatic cysts Migraine - follow up with PCP , managed per primary team      For questions or updates, please contact [facility] Please consult [email or web address] for contact info under        Signed, Addisyn Leclaire ZhaMargie Billet9/2022, 9:36 AM

## 2021-02-20 NOTE — Progress Notes (Signed)
     [address]       [place] [address]             [phone]       No events  Vitals:   02/19/21 2043 02/20/21 0456  BP: 115/72 132/80  Pulse: 68 68  Resp: 18 20  Temp: 97.8 F (36.6 C) 98.5 F (36.9 C)  SpO2: 98% 98%   Alert NAD Sinus EWOB  55 yo female with pericardial cyst OR today for R RATS, resection of periardial cyst  Jill Jenkins

## 2021-02-20 NOTE — Progress Notes (Signed)
Dr. Kipp Brood at bedside speaking with patient, MD stated type and screen nor blood consent needed. Understanding verbalized.

## 2021-02-20 NOTE — Anesthesia Postprocedure Evaluation (Signed)
Anesthesia Post Note  Patient: Jill Jenkins  Procedure(s) Performed: XI ROBOTIC ASSISTED THORACOSCOPY PERICARDIAL CYST RESECTION (Right) INTERCOSTAL NERVE BLOCK (Right: Chest)     Patient location during evaluation: PACU Anesthesia Type: General Level of consciousness: awake and alert Pain management: pain level controlled Vital Signs Assessment: post-procedure vital signs reviewed and stable Respiratory status: spontaneous breathing, nonlabored ventilation, respiratory function stable and patient connected to nasal cannula oxygen Cardiovascular status: blood pressure returned to baseline and stable Postop Assessment: no apparent nausea or vomiting Anesthetic complications: no   No notable events documented.  Last Vitals:  Vitals:   02/20/21 1700 02/20/21 1800  BP: 120/83 122/86  Pulse: 71 77  Resp: 19 (!) 25  Temp: 36.4 C   SpO2: 97%     Last Pain:  Vitals:   02/20/21 1800  TempSrc:   PainSc: 8                  Tyrene Nader COKER

## 2021-02-20 NOTE — Transfer of Care (Signed)
Immediate Anesthesia Transfer of Care Note  Patient: Senora P Dudash  Procedure(s) Performed: XI ROBOTIC ASSISTED THORACOSCOPY PERICARDIAL CYST RESECTION (Right) INTERCOSTAL NERVE BLOCK (Right: Chest)  Patient Location: PACU  Anesthesia Type:General  Level of Consciousness: awake and patient cooperative  Airway & Oxygen Therapy: Patient Spontanous Breathing and Patient connected to face mask oxygen  Post-op Assessment: Report given to RN, Post -op Vital signs reviewed and stable and Patient moving all extremities X 4  Post vital signs: Reviewed and stable  Last Vitals:  Vitals Value Taken Time  BP 136/80 02/20/21 1559  Temp    Pulse 74 02/20/21 1603  Resp 28 02/20/21 1603  SpO2 100 % 02/20/21 1603  Vitals shown include unvalidated device data.  Last Pain:  Vitals:   02/20/21 1159  TempSrc: Oral  PainSc: 2          Complications: No notable events documented.

## 2021-02-20 NOTE — Anesthesia Procedure Notes (Signed)
Procedure Name: Intubation Date/Time: 02/20/2021 1:43 PM Performed by: Sherlie Ban, RN Pre-anesthesia Checklist: Patient identified, Emergency Drugs available, Suction available and Patient being monitored Patient Re-evaluated:Patient Re-evaluated prior to induction Oxygen Delivery Method: Circle System Utilized Preoxygenation: Pre-oxygenation with 100% oxygen Induction Type: IV induction Ventilation: Mask ventilation without difficulty and Oral airway inserted - appropriate to patient size Laryngoscope Size: Mac and 3 Grade View: Grade I Tube type: Oral Endobronchial tube: Double lumen EBT and EBT position confirmed by fiberoptic bronchoscope Number of attempts: 1 Airway Equipment and Method: Stylet and Oral airway Placement Confirmation: ETT inserted through vocal cords under direct vision, positive ETCO2 and breath sounds checked- equal and bilateral Tube secured with: Tape Dental Injury: Teeth and Oropharynx as per pre-operative assessment

## 2021-02-20 NOTE — Op Note (Signed)
      [address]       [place] [address]             [phone]        02/20/2021  Patient:  Jill Jenkins Pre-Op Dx: Pericardial cyst   Pericarditis Post-op Dx: Same Procedure: - Robotic assisted right video thoracoscopy -Resection of pericardial cyst - Intercostal nerve block  Surgeon and Role:      * Salomon Ganser, Lucile Crater, MD - Primary    Joellyn Rued, PA-C- assisting  Anesthesia  general EBL: 50 ml Blood Administration: None Specimen: Pericardial cyst, pleural fluid  Drains: 19 F chest tube in right chest Counts: correct   Indications: 55 year old female admitted with acute onset chest pain.  Cross-sectional imaging revealed a pericardial cyst along with some pericarditis.  The patient wanted the pericardial cyst removed.  She was agreeable to proceed with robotic assisted resection.  Findings: Large murky serous pleural effusion.  A bilobed pericardial cyst.  The lower portion was deeply invested into the pericardium and we had to perform a pericardial window to get this out safely.  The upper portion was very close to the phrenic nerve.  Operative Technique: After the risks, benefits and alternatives were thoroughly discussed, the patient was brought to the operative theatre.  Anesthesia was induced, and the patient was then placed in a left lateral decubitus position and was prepped and draped in normal sterile fashion.  An appropriate surgical pause was performed, and pre-operative antibiotics were dosed accordingly.  We began by placing our 4 robotic ports in the the intercostal spaces targeting the pericardium.  The robot was then docked and all instruments were passed under direct visualization.    The pericardium was visualized, and a bilobed pericardial cyst was evident.  Meticulous dissection was performed as this was also very close to the phrenic nerve.  It was deeply invested into the pericardium thus we performed a pericardial window to excise  this in its entirety.  Pericardial window measured approximately 3 x 3 cm, and was overlying the ascending aorta.  Pericardial cyst was placed in an Endo Catch bag, and removed from the anterior port site.  The robot was then undocked.    A 19 [place] Blake drain was passed through right inferior robotic port into the pericardium.  An intercostal nerve block was performed under direct visualization.  The skin and soft tissue were closed with absorbable suture    The patient tolerated the procedure without any immediate complications, and was transferred to the PACU in stable condition.  [name]

## 2021-02-20 NOTE — Progress Notes (Signed)
PT Cancellation Note  Patient Details Name: Jill Jenkins MRN: [id number] DOB: [birth year]   Cancelled Treatment:    Reason Eval/Treat Not Completed: Patient at procedure or test/unavailable. PT will follow up as time allows.   Zenaida Niece 02/20/2021, 3:37 PM

## 2021-02-21 ENCOUNTER — Encounter (HOSPITAL_COMMUNITY): Payer: Self-pay | Admitting: Thoracic Surgery (Cardiothoracic Vascular Surgery)

## 2021-02-21 ENCOUNTER — Inpatient Hospital Stay (HOSPITAL_COMMUNITY): Payer: BC Managed Care – PPO

## 2021-02-21 DIAGNOSIS — K7689 Other specified diseases of liver: Secondary | ICD-10-CM

## 2021-02-21 DIAGNOSIS — Z9889 Other specified postprocedural states: Secondary | ICD-10-CM

## 2021-02-21 LAB — BASIC METABOLIC PANEL
Anion gap: 9 (ref 5–15)
BUN: 11 mg/dL (ref 6–20)
CO2: 20 mmol/L — ABNORMAL LOW (ref 22–32)
Calcium: 8.6 mg/dL — ABNORMAL LOW (ref 8.9–10.3)
Chloride: 108 mmol/L (ref 98–111)
Creatinine, Ser: 0.94 mg/dL (ref 0.44–1.00)
GFR, Estimated: >60 mL/min (ref >60)
Glucose, Bld: 130 mg/dL — ABNORMAL HIGH (ref 70–99)
Potassium: 3.7 mmol/L (ref 3.5–5.1)
Sodium: 137 mmol/L (ref 135–145)

## 2021-02-21 LAB — CBC
HCT: 35.7 % — ABNORMAL LOW (ref 36.0–46.0)
Hemoglobin: 11.6 g/dL — ABNORMAL LOW (ref 12.0–15.0)
MCH: 31.6 pg (ref 26.0–34.0)
MCHC: 32.5 g/dL (ref 30.0–36.0)
MCV: 97.3 fL (ref 80.0–100.0)
Platelets: 200 10*3/uL (ref 150–400)
RBC: 3.67 MIL/uL — ABNORMAL LOW (ref 3.87–5.11)
RDW: 12.5 % (ref 11.5–15.5)
WBC: 11.5 10*3/uL — ABNORMAL HIGH (ref 4.0–10.5)
nRBC: 0 % (ref 0.0–0.2)

## 2021-02-21 LAB — GLUCOSE, CAPILLARY
Glucose-Capillary: 119 mg/dL — ABNORMAL HIGH (ref 70–99)
Glucose-Capillary: 127 mg/dL — ABNORMAL HIGH (ref 70–99)

## 2021-02-21 MED ORDER — COLCHICINE 0.6 MG PO TABS: 0.6000 mg | ORAL_TABLET | Freq: Every day | ORAL | 2 refills | Status: DC

## 2021-02-21 MED ORDER — IBUPROFEN 800 MG PO TABS: 800.0000 mg | ORAL_TABLET | Freq: Three times a day (TID) | ORAL | 0 refills | Status: AC

## 2021-02-21 MED ORDER — PANTOPRAZOLE SODIUM 40 MG PO TBEC: 40.0000 mg | DELAYED_RELEASE_TABLET | Freq: Two times a day (BID) | ORAL | 0 refills | Status: DC

## 2021-02-21 NOTE — Progress Notes (Signed)
Progress Note  Patient Name: Jill Jenkins Date of Encounter: 02/21/2021  [facility] Cardiologist: Skeet Latch, MD (new)  Subjective   Happy to have her cyst out.  Discomfort from the drain.   Inpatient Medications    Scheduled Meds:  acetaminophen  1,000 mg Oral Q6H   Or   acetaminophen (TYLENOL) oral liquid 160 mg/5 mL  1,000 mg Oral Q6H   bisacodyl  10 mg Oral Daily   Chlorhexidine Gluconate Cloth  6 each Topical Daily   enoxaparin (LOVENOX) injection  40 mg Subcutaneous Daily   insulin aspart  0-24 Units Subcutaneous TID AC & HS   ketorolac  30 mg Intravenous Q6H   senna-docusate  1 tablet Oral QHS   topiramate  25 mg Oral BID   Continuous Infusions:  PRN Meds: morphine injection, ondansetron (ZOFRAN) IV, traMADol   Vital Signs    Vitals:   02/20/21 2300 02/21/21 0300 02/21/21 0700 02/21/21 0800  BP: 119/71 111/70 128/80 126/81  Pulse: 74 65 70 62  Resp: 20 18 20 13   Temp: 98.6 F (37 C) 98.4 F (36.9 C) 98.5 F (36.9 C)   TempSrc: Oral Oral Oral   SpO2: 96% 95% 97% 97%  Weight:        Intake/Output Summary (Last 24 hours) at 02/21/2021 0857 Last data filed at 02/21/2021 0300 Gross per 24 hour  Intake 2050.78 ml  Output 40 ml  Net 2010.78 ml   Last 3 Weights 02/20/2021 02/19/2021 02/18/2021  Weight (lbs) 201 lb 8 oz 201 lb 8 oz 201 lb 15.1 oz  Weight (kg) 91.4 kg 91.4 kg 91.6 kg      Telemetry    Sinus rhythm.  - Personally Reviewed  ECG    N/A- Personally Reviewed  Physical Exam   VS:  BP 126/81   Pulse 62   Temp 98.5 F (36.9 C) (Oral)   Resp 13   Wt 91.4 kg   SpO2 97%   BMI 28.91 kg/m  , BMI Body mass index is 28.91 kg/m. GENERAL:  Well appearing HEENT: Pupils equal round and reactive, fundi not visualized, oral mucosa unremarkable NECK:  No jugular venous distention, waveform within normal limits, carotid upstroke brisk and symmetric, no bruits LUNGS:  Clear to auscultation bilaterally HEART:  RRR.  PMI not displaced or  sustained,S1 and S2 within normal limits, no S3, no S4, no clicks, no rubs, no murmurs ABD:  Flat, positive bowel sounds normal in frequency in pitch, no bruits, no rebound, no guarding, no midline pulsatile mass, no hepatomegaly, no splenomegaly EXT:  2 plus pulses throughout, no edema, no cyanosis no clubbing SKIN:  No rashes no nodules NEURO:  Cranial nerves II through XII grossly intact, motor grossly intact throughout PSYCH:  Cognitively intact, oriented to person place and time   Labs    High Sensitivity Troponin:   Recent Labs  Lab 02/15/21 1502 02/15/21 1920 02/16/21 0200  TROPONINIHS 3 4 4       Chemistry Recent Labs  Lab 02/15/21 1951 02/16/21 0200 02/19/21 1010 02/20/21 0259 02/21/21 0312  NA  --    < > 140 139 137  K  --    < > 3.7 4.3 3.7  CL  --    < > 113* 113* 108  CO2  --    < > 18* 19* 20*  GLUCOSE  --    < > 113* 147* 130*  BUN  --    < > 12 13 11  CREATININE  --    < > 1.00 1.07* 0.94  CALCIUM  --    < > 8.8* 8.5* 8.6*  PROT 6.8  --   --  5.5*  --   ALBUMIN 3.9  --   --  2.8*  --   AST 24  --   --  39  --   ALT 38  --   --  49*  --   ALKPHOS 39  --   --  60  --   BILITOT 0.7  --   --  0.6  --   GFRNONAA  --    < > >60 >60 >60  ANIONGAP  --    < > 9 7 9    < > = values in this interval not displayed.     Hematology Recent Labs  Lab 02/16/21 0200 02/16/21 1052 02/21/21 0312  WBC 13.1* 10.5 11.5*  RBC 4.31 4.35 3.67*  HGB 13.7 14.0 11.6*  HCT 41.7 41.5 35.7*  MCV 96.8 95.4 97.3  MCH 31.8 32.2 31.6  MCHC 32.9 33.7 32.5  RDW 13.0 13.0 12.5  PLT 186 170 200    BNPNo results for input(s): BNP, PROBNP in the last 168 hours.   DDimer  Recent Labs  Lab 02/15/21 1951  DDIMER 0.62*     Radiology    US Abdomen Complete  Result Date: 02/19/2021 CLINICAL DATA:  Liver cyst. EXAM: ABDOMEN ULTRASOUND COMPLETE COMPARISON:  None. FINDINGS: Gallbladder: A 5 mm nonshadowing echogenic focus is seen along the gallbladder wall. No gallstones or wall  thickening visualized (2.8 mm). No sonographic Murphy sign noted by sonographer. Common bile duct: Diameter: 3.8 mm Liver: A 3.3 cm x 2.4 cm x 2.7 cm hypoechoic area is seen within the right lobe of the liver. Additional hypoechoic areas are seen within the left lobe. The largest measures approximately 3.5 cm x 2.8 cm x 2.6 cm. No abnormal flow is seen within these regions on color Doppler evaluation. Within normal limits in parenchymal echogenicity. Portal vein is patent on color Doppler imaging with normal direction of blood flow towards the liver. IVC: No abnormality visualized. Pancreas: Visualized portion unremarkable. Spleen: Size (7.9 cm) and appearance within normal limits. Right Kidney: Length: 11.2 cm. Echogenicity within normal limits. No mass or hydronephrosis visualized. Left Kidney: Length: 12.0 cm. Echogenicity within normal limits. A dromedary hump is suspected. No mass or hydronephrosis visualized. Abdominal aorta: No aneurysm visualized (2.4 cm in AP diameter). Other findings: The study is limited secondary to patient motion. Of incidental note is the presence of bilateral pleural effusions. IMPRESSION: 1. Multiple simple hepatic cysts. 2. 5 mm gallbladder polyp. This is likely benign in nature. No additional evaluation or follow-up is recommended. This recommendation follows ACR consensus guidelines: White Paper of the ACR Incidental Findings Committee II on Gallbladder and Biliary Findings. J Am Coll Radiol 2013:;10:953-956. 3. Bilateral pleural effusions. Electronically Signed   By: Virgina Norfolk M.D.   On: 02/19/2021 17:49   DG Chest Port 1 View  Result Date: 02/21/2021 CLINICAL DATA:  Postoperative pericardial window placement EXAM: PORTABLE CHEST 1 VIEW COMPARISON:  February 20, 2021 FINDINGS: Central catheter tip is in the superior vena cava. There is a chest tube on the right. No pneumothorax. There is airspace opacity in the left lower lobe, stable. Lungs otherwise clear. Heart is  slightly enlarged with pulmonary vascularity normal. No adenopathy. No bone lesions. IMPRESSION: Tube and catheter positions as described without pneumothorax. Opacity left lower lobe likely due  primarily to atelectasis, although superimposed pneumonia in the left base cannot be excluded. Lungs elsewhere clear. Heart mildly enlarged. Electronically Signed   By: Lowella Grip III M.D.   On: 02/21/2021 08:15   DG Chest Port 1 View  Result Date: 02/20/2021 CLINICAL DATA:  Status post robot assisted surgery EXAM: PORTABLE CHEST 1 VIEW COMPARISON:  02/15/2021 FINDINGS: Right internal jugular central line tip in the SVC 5 cm above the right atrium. Soft tissue drain overlies the mid chest. Right lung is clear. There is moderate atelectasis in the left lower lobe. No pneumothorax. IMPRESSION: Moderate atelectasis in the left lower lobe. Electronically Signed   By: Nelson Chimes M.D.   On: 02/20/2021 19:02     Cardiac Studies   Echo 02/16/2021:  1. Left ventricular ejection fraction, by estimation, is 60 to 65%. The  left ventricle has normal function. The left ventricle has no regional  wall motion abnormalities. Left ventricular diastolic parameters were  normal.   2. Right ventricular systolic function is normal. The right ventricular  size is normal. There is normal pulmonary artery systolic pressure.   3. The mitral valve is normal in structure. No evidence of mitral valve  regurgitation. No evidence of mitral stenosis.   4. The aortic valve is tricuspid. Aortic valve regurgitation is not  visualized. No aortic stenosis is present.   5. The inferior vena cava is normal in size with greater than 50%  respiratory variability, suggesting right atrial pressure of 3 mmHg.   Chest CT-A (02/15/2021): IMPRESSION: 1. No pulmonary embolus. 2. No acute intrathoracic abnormality. 3. 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 4. Multiple fluid density lesions within the liver statistically likely  represent hepatic cysts.  Cardiac MRI 02/17/2021: IMPRESSION: 1. There is a 79mm x 21mm x 16mm mass adjacent to the right atrium. Mass is hypointense to myocardium on T1 weighted imaging, hyperintense on T2 weighted imaging, does not suppress with fat saturation, no contrast uptake on first pass perfusion, and no late gadolinium enhancement. This is consistent with a pericardial cyst.   2. Pericardial LGE and hyperenhancement on T2 weighted imaging suggests pericardial inflammation, consistent with acute pericarditis   3. Small pericardial effusion measuring up to 52mm adjacent to LV lateral wall   4. Normal LV size, wall thickness, and systolic function (EF 50%). No LGE to suggest myocardial scar   5.  Normal RV size and systolic function (EF 56%)   6.  Dilated ascending aorta measuring 3mm  Patient Profile     Ms. Vitiello is a 74F with no cardiac history here with syncope and chest pain.  She was found to have what appears to be a larger pericardial cyst and acute pericarditis.    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  There is no evidence of hemodynamic compromise.  It seems as though her syncope was a vagally mediated in the setting of severe, stabbing chest pain.  Her chest pain has improved significantly with colchicine and ibuprofen.  Pericarditis seen on cardiac MRI and CRP mildly elevated.  She underwent surgical resection on 6/9 and is doing well.  Inflammation was noted at the time of surgery.  Continue colchicine for 3 months and ibuprofen for 1 month followed by a taper of 800mg  bid for a week then stop.  Discharge per CT surgery recommendations.  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  Repeat imaging in 6-12 months.  She does not have hypertension.  # SVT:  Likely related to the pericarditis.  Management as above.    [facility] will sign off.   Medication Recommendations:   Continue colchicine for 3 months and ibuprofen for 1 month followed by a taper of 800mg  bid for a week then stop.  Other recommendations (labs, testing, etc):  none  Follow up as an outpatient:  we will arrange   For questions or updates, please contact [place] Please consult [email or web address] for contact info under        Signed, Skeet Latch, MD  02/21/2021, 8:57 AM

## 2021-02-21 NOTE — Discharge Instructions (Signed)

## 2021-02-21 NOTE — Plan of Care (Signed)
Problem: Education: Goal: Knowledge of General Education information will improve Description: Including pain rating scale, medication(s)/side effects and non-pharmacologic comfort measures 02/21/2021 0819 by Shanon Ace, RN Outcome: Progressing 02/20/2021 2017 by Shanon Ace, RN Outcome: Progressing   Problem: Health Behavior/Discharge Planning: Goal: Ability to manage health-related needs will improve 02/21/2021 0819 by Shanon Ace, RN Outcome: Progressing 02/20/2021 2017 by Shanon Ace, RN Outcome: Progressing   Problem: Clinical Measurements: Goal: Ability to maintain clinical measurements within normal limits will improve 02/21/2021 0819 by Shanon Ace, RN Outcome: Progressing 02/20/2021 2017 by Shanon Ace, RN Outcome: Progressing Goal: Will remain free from infection 02/21/2021 0819 by Shanon Ace, RN Outcome: Progressing 02/20/2021 2017 by Shanon Ace, RN Outcome: Progressing Goal: Diagnostic test results will improve 02/21/2021 0819 by Shanon Ace, RN Outcome: Progressing 02/20/2021 2017 by Shanon Ace, RN Outcome: Progressing Goal: Respiratory complications will improve 02/21/2021 0819 by Shanon Ace, RN Outcome: Progressing 02/20/2021 2017 by Shanon Ace, RN Outcome: Progressing Goal: Cardiovascular complication will be avoided 02/21/2021 0819 by Shanon Ace, RN Outcome: Progressing 02/20/2021 2017 by Shanon Ace, RN Outcome: Progressing   Problem: Activity: Goal: Risk for activity intolerance will decrease 02/21/2021 0819 by Shanon Ace, RN Outcome: Progressing 02/20/2021 2017 by Shanon Ace, RN Outcome: Progressing   Problem: Nutrition: Goal: Adequate nutrition will be maintained 02/21/2021 0819 by Shanon Ace, RN Outcome: Progressing 02/20/2021 2017 by Shanon Ace, RN Outcome: Progressing   Problem: Coping: Goal: Level of anxiety will decrease 02/21/2021 0819 by Shanon Ace, RN Outcome: Progressing 02/20/2021 2017 by Shanon Ace,  RN Outcome: Progressing   Problem: Elimination: Goal: Will not experience complications related to bowel motility 02/21/2021 0819 by Shanon Ace, RN Outcome: Progressing 02/20/2021 2017 by Shanon Ace, RN Outcome: Progressing Goal: Will not experience complications related to urinary retention 02/21/2021 0819 by Shanon Ace, RN Outcome: Progressing 02/20/2021 2017 by Shanon Ace, RN Outcome: Progressing   Problem: Pain Managment: Goal: General experience of comfort will improve 02/21/2021 0819 by Shanon Ace, RN Outcome: Progressing 02/20/2021 2017 by Shanon Ace, RN Outcome: Progressing   Problem: Safety: Goal: Ability to remain free from injury will improve 02/21/2021 0819 by Shanon Ace, RN Outcome: Progressing 02/20/2021 2017 by Shanon Ace, RN Outcome: Progressing   Problem: Skin Integrity: Goal: Risk for impaired skin integrity will decrease 02/21/2021 0819 by Shanon Ace, RN Outcome: Progressing 02/20/2021 2017 by Shanon Ace, RN Outcome: Progressing   Problem: Education: Goal: Will demonstrate proper wound care and an understanding of methods to prevent future damage 02/21/2021 0819 by Shanon Ace, RN Outcome: Progressing 02/20/2021 2017 by Shanon Ace, RN Outcome: Progressing Goal: Knowledge of disease or condition will improve 02/21/2021 0819 by Shanon Ace, RN Outcome: Progressing 02/20/2021 2017 by Shanon Ace, RN Outcome: Progressing Goal: Knowledge of the prescribed therapeutic regimen will improve 02/21/2021 0819 by Shanon Ace, RN Outcome: Progressing 02/20/2021 2017 by Shanon Ace, RN Outcome: Progressing Goal: Individualized Educational Video(s) 02/21/2021 0819 by Shanon Ace, RN Outcome: Progressing 02/20/2021 2017 by Shanon Ace, RN Outcome: Progressing   Problem: Activity: Goal: Risk for activity intolerance will decrease 02/21/2021 0819 by Shanon Ace, RN Outcome: Progressing 02/20/2021 2017 by Shanon Ace, RN Outcome:  Progressing   Problem: Cardiac: Goal: Will achieve and/or maintain hemodynamic stability 02/21/2021 0819 by Shanon Ace, RN Outcome: Progressing 02/20/2021 2017 by Shanon Ace, RN Outcome: Progressing   Problem: Clinical Measurements: Goal: Postoperative complications will be avoided or minimized 02/21/2021 0819 by Shanon Ace, RN Outcome: Progressing 02/20/2021 2017 by Shanon Ace, RN Outcome: Progressing   Problem: Respiratory: Goal: Respiratory  status will improve 02/21/2021 0819 by Shanon Ace, RN Outcome: Progressing 02/20/2021 2017 by Shanon Ace, RN Outcome: Progressing   Problem: Skin Integrity: Goal: Wound healing without signs and symptoms of infection 02/21/2021 0819 by Shanon Ace, RN Outcome: Progressing 02/20/2021 2017 by Shanon Ace, RN Outcome: Progressing Goal: Risk for impaired skin integrity will decrease 02/21/2021 0819 by Shanon Ace, RN Outcome: Progressing 02/20/2021 2017 by Shanon Ace, RN Outcome: Progressing   Problem: Urinary Elimination: Goal: Ability to achieve and maintain adequate renal perfusion and functioning will improve 02/21/2021 0819 by Shanon Ace, RN Outcome: Progressing 02/20/2021 2017 by Shanon Ace, RN Outcome: Progressing

## 2021-02-21 NOTE — Discharge Summary (Signed)
Physician Discharge Summary  VYLA PINT NWG:[id number] DOB: 1966-08-23 DOA: 02/15/2021  PCP: Chesley Noon, MD  Admit date: 02/15/2021 Discharge date: 02/21/2021  Time spent: 60 minutes  Recommendations for Outpatient Follow-up:  Follow-up cardiology as outpatient Follow-up CT surgery  Follow-up with [facility] as outpatient for liver cysts   Discharge Diagnoses:  Active Problems:   Syncope and collapse   Chest pain   Malignant pericardial effusion (HCC)   Acute idiopathic pericarditis   Pericardial cyst   SVT (supraventricular tachycardia) (HCC)   Syncope   Pericarditis   Discharge Condition: Stable  Diet recommendation: Regular diet  Filed Weights   02/18/21 0540 02/19/21 0551 02/20/21 0456  Weight: 91.6 kg 91.4 kg 91.4 kg    History of present illness:  55 year old female with medical history of migraine who came to [facility] with severe exertional chest pain with shortness of breath.  Also had syncopal episode where she lost consciousness for about 3 minutes and also had shaking of her right hand.  D-dimer was elevated, CTA chest was negative for PE but showed pericardial cyst.  Cardiology was consulted, patient started on colchicine and ibuprofen for pericarditis. CT surgery was consulted for draining the pericardial cyst.  Hospital Course:   Acute pericarditis -Presented with chest pain and shortness of breath -CTA chest showed 4.3 cm pericardial cyst -Concern for acute pericarditis; started on colchicine, ibuprofen -ESR is 1, TSH normal -Cardiology following; plan for colchicine for 3 months and ibuprofen for 1 month -Continue Protonix 40 mg p.o. twice daily for 1 month for PPI prophylaxis     Pericardial cyst -4.3 cm pericardial cyst seen on CT chest -CT surgery was consulted -Patient underwent  robotic resection of cyst on 02/20/2021 -CT surgery has cleared for discharge -Patient to follow-up CT surgery as outpatient     Incidental multiple hepatic  cysts -Seen on CTA chest -Abdominal ultrasound shows multiple cysts in the liver -LFTs are normal -Called and discussed with [facility]; outpatient appointment will be set up     Syncope -Resolved -Unclear etiology; likely vasovagal -EEG unremarkable for epileptiform discharges     History of migraine -Continue Topamax    Procedures: Robotic assisted resection of pericardial cyst  Consultations: CT surgery Cardiology  Discharge Exam: Vitals:   02/21/21 0800 02/21/21 1114  BP: 126/81 129/83  Pulse: 62 65  Resp: 13 20  Temp:  98 F (36.7 C)  SpO2: 97% 98%    General: Appears in no acute distress Cardiovascular: S1-S2, regular Respiratory: Clear to auscultation bilaterally  Discharge Instructions   Discharge Instructions     Diet - low sodium heart healthy   Complete by: As directed    Increase activity slowly   Complete by: As directed    No wound care   Complete by: As directed       Allergies as of 02/21/2021       Reactions   No Known Allergies         Medication List     TAKE these medications    colchicine 0.6 MG tablet Take 1 tablet (0.6 mg total) by mouth daily. Stop after 3 months   eletriptan 40 MG tablet Commonly known as: RELPAX Take 1 tablet (40 mg total) by mouth as needed for migraine or headache (For acute migraine treatment; may repeat x1 PRN >2 hrs; max dose 41m/24hr).   estradiol 0.1 MG/GM vaginal cream Commonly known as: ESTRACE Place 1 Applicatorful vaginally 3 (three) times a week.  ibuprofen 800 MG tablet Commonly known as: ADVIL Take 1 tablet (800 mg total) by mouth in the morning, at noon, and at bedtime. Stop after one month   pantoprazole 40 MG tablet Commonly known as: Protonix Take 1 tablet (40 mg total) by mouth 2 (two) times daily. Stop after one month   topiramate 50 MG tablet Commonly known as: TOPAMAX Take 1 tablet (50 mg total) by mouth 2 (two) times daily.   Tretinoin Microsphere 0.06 % Gel Retin-A  Micro Pump 0.06 % topical gel  1 APPLICATION THIN LAYER TO FACE NIGHTLY       Allergies  Allergen Reactions   No Known Allergies     Follow-up Information     Deberah Pelton, NP Follow up.   Specialty: Cardiology Why: Hospital follow-up with Cardiology scheduled for 03/13/2021 at 3:15pm. Please arrive 15 minutes early for check-in. If this date/time does not work for you, please call our office to reschedule. Contact information: [address] [place] [address] [phone]         Lajuana Matte, MD. Schedule an appointment as soon as possible for a visit in 1 week(s).   Specialty: Cardiothoracic Surgery Contact information: [place] [address] [phone]                  The results of significant diagnostics from this hospitalization (including imaging, microbiology, ancillary and laboratory) are listed below for reference.    Significant Diagnostic Studies: DG Chest 2 View  Result Date: 02/15/2021 CLINICAL DATA:  Chest pain and shortness of breath. EXAM: CHEST - 2 VIEW COMPARISON:  None. FINDINGS: The heart size and mediastinal contours are within normal limits. Both lungs are clear. The visualized skeletal structures are unremarkable. IMPRESSION: No active cardiopulmonary disease. Electronically Signed   By: Virgina Norfolk M.D.   On: 02/15/2021 15:17   CT Angio Chest PE W and/or Wo Contrast  Result Date: 02/16/2021 CLINICAL DATA:  sharp cp. Patient stated that the pain started after putting sheets on the bed. PE suspected, low/intermediate prob, positive D-dimer EXAM: CT ANGIOGRAPHY CHEST WITH CONTRAST TECHNIQUE: Multidetector CT imaging of the chest was performed using the standard protocol during bolus administration of intravenous contrast. Multiplanar CT image reconstructions and MIPs were obtained to evaluate the vascular anatomy. CONTRAST:  103m OMNIPAQUE IOHEXOL 350 MG/ML SOLN COMPARISON:  None.  FINDINGS: Cardiovascular: Satisfactory opacification of the pulmonary arteries to the segmental level. No evidence of pulmonary embolism. Normal heart size. No significant pericardial effusion. The thoracic aorta is normal in caliber. No atherosclerotic plaque of the thoracic aorta. No definite coronary artery calcifications. 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 No enlarged mediastinal, hilar, or axillary lymph nodes. Thyroid gland, trachea, and esophagus demonstrate no significant findings. Lungs/Pleura: Bilateral lower lobe subsegmental atelectasis. No focal consolidation. No pulmonary nodule. No pulmonary mass. No pleural effusion. No pneumothorax Upper Abdomen: Multiple fluid density lesions within the liver likely represent hepatic cysts with the largest in the left hepatic lobe measuring up to 3.8 cm. Musculoskeletal: No chest wall abnormality.  Bilateral breast implants. No suspicious lytic or blastic osseous lesions. No acute displaced fracture. Multilevel degenerative changes of the spine. Review of the MIP images confirms the above findings. IMPRESSION: 1. No pulmonary embolus. 2. No acute intrathoracic abnormality. 3. 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 4. Multiple fluid density lesions within  the liver statistically likely represent hepatic cysts. Electronically Signed   By: Iven Finn M.D.   On: 02/16/2021 00:08   US Abdomen Complete  Result Date: 02/19/2021 CLINICAL DATA:  Liver cyst. EXAM: ABDOMEN ULTRASOUND COMPLETE COMPARISON:  None. FINDINGS: Gallbladder: A 5 mm nonshadowing echogenic focus is seen along the gallbladder wall. No gallstones or wall thickening visualized (2.8 mm). No sonographic Murphy sign noted by sonographer. Common bile duct: Diameter: 3.8 mm Liver: A 3.3 cm x 2.4 cm x 2.7 cm hypoechoic area is seen within the right lobe of the liver. Additional hypoechoic  areas are seen within the left lobe. The largest measures approximately 3.5 cm x 2.8 cm x 2.6 cm. No abnormal flow is seen within these regions on color Doppler evaluation. Within normal limits in parenchymal echogenicity. Portal vein is patent on color Doppler imaging with normal direction of blood flow towards the liver. IVC: No abnormality visualized. Pancreas: Visualized portion unremarkable. Spleen: Size (7.9 cm) and appearance within normal limits. Right Kidney: Length: 11.2 cm. Echogenicity within normal limits. No mass or hydronephrosis visualized. Left Kidney: Length: 12.0 cm. Echogenicity within normal limits. A dromedary hump is suspected. No mass or hydronephrosis visualized. Abdominal aorta: No aneurysm visualized (2.4 cm in AP diameter). Other findings: The study is limited secondary to patient motion. Of incidental note is the presence of bilateral pleural effusions. IMPRESSION: 1. Multiple simple hepatic cysts. 2. 5 mm gallbladder polyp. This is likely benign in nature. No additional evaluation or follow-up is recommended. This recommendation follows ACR consensus guidelines: White Paper of the ACR Incidental Findings Committee II on Gallbladder and Biliary Findings. J Am Coll Radiol 2013:;10:953-956. 3. Bilateral pleural effusions. Electronically Signed   By: Virgina Norfolk M.D.   On: 02/19/2021 17:49   DG Chest Port 1 View  Result Date: 02/21/2021 CLINICAL DATA:  Postoperative pericardial window placement EXAM: PORTABLE CHEST 1 VIEW COMPARISON:  February 20, 2021 FINDINGS: Central catheter tip is in the superior vena cava. There is a chest tube on the right. No pneumothorax. There is airspace opacity in the left lower lobe, stable. Lungs otherwise clear. Heart is slightly enlarged with pulmonary vascularity normal. No adenopathy. No bone lesions. IMPRESSION: Tube and catheter positions as described without pneumothorax. Opacity left lower lobe likely due primarily to atelectasis, although  superimposed pneumonia in the left base cannot be excluded. Lungs elsewhere clear. Heart mildly enlarged. Electronically Signed   By: Lowella Grip III M.D.   On: 02/21/2021 08:15   DG Chest Port 1 View  Result Date: 02/20/2021 CLINICAL DATA:  Status post robot assisted surgery EXAM: PORTABLE CHEST 1 VIEW COMPARISON:  02/15/2021 FINDINGS: Right internal jugular central line tip in the SVC 5 cm above the right atrium. Soft tissue drain overlies the mid chest. Right lung is clear. There is moderate atelectasis in the left lower lobe. No pneumothorax. IMPRESSION: Moderate atelectasis in the left lower lobe. Electronically Signed   By: Nelson Chimes M.D.   On: 02/20/2021 19:02   MR CARDIAC MORPHOLOGY W WO CONTRAST  Result Date: 02/17/2021 CLINICAL DATA:  Suspected pericardial cyst EXAM: CARDIAC MRI TECHNIQUE: The patient was scanned on a 1.5 Tesla Siemens magnet. A dedicated cardiac coil was used. Functional imaging was done using Fiesta sequences. 2,3, and 4 chamber views were done to assess for RWMA's. Modified Simpson's rule using a short axis stack was used to calculate an ejection fraction on a dedicated work Conservation officer, nature. The patient received 12 cc of Gadavist. After  10 minutes inversion recovery sequences were used to assess for infiltration and scar tissue. CONTRAST:  12 cc  of Gadavist FINDINGS: Left ventricle: -Normal size -Normal wall thickness -Normal systolic function -Normal ECV (25%) -No LGE LV EF:  67% (Normal 56-78%) Absolute volumes: LV EDV: 133m (Normal 52-141 mL) LV ESV: 355m(Normal 13-51 mL) LV SV: 6933mNormal 33-97 mL) CO: 4.7L/min (Normal 2.7-6.0 L/min) Indexed volumes: LV EDV: 72m79m-m (Normal 41-81 mL/sq-m) LV ESV: 16mL46mm (Normal 12-21 mL/sq-m) LV SV: 32mL/59m (Normal 26-56 mL/sq-m) CI: 2.2L/min/sq-m (Normal 1.8-3.8 L/min/sq-m) Right ventricle: Normal size and systolic function RV EF: 64% (N85%al 47-80%) Absolute volumes: RV EDV: 106mL (63mal 58-154 mL) RV ESV:  38mL (N79ml 12-68 mL) RV SV: 68mL (No79m 35-98 mL) CO: 4.6L/min (Normal 2.7-6 L/min) Indexed volumes: RV EDV: 50mL/sq-m39mrmal 48-87 mL/sq-m) RV ESV: 18mL/sq-m 47mmal 11-28 mL/sq-m) RV SV: 32mL/sq-m (16mal 27-57 mL/sq-m) CI: 2.2L/min/sq-m (Normal 1.8-3.8 L/min/sq-m) Left atrium: Normal size Right atrium: Normal size Mitral valve: No regurgitation Aortic valve: No regurgitation Tricuspid valve: Trivial regurgitation Pulmonic valve: No regurgitation Aorta: Dilated ascending aorta measuring 40mm Pericar88m: Small effusion measuring up to 7mm adjacent 64mLV lateral wall. There is a 40mm x 38mm x 1m [id number] the right atrium. Mass is hypointense to myocardium on T1 weighted imaging, hyperintense on T2 weighted imaging, does not suppress with fat saturation, no contrast uptake on first pass perfusion, and no late gaolinium enhancement. This is consistent with a pericardial cyst. IMPRESSION: 1. There is a 40mm x 38mm x 60m78mss a34ment 66mhe right atrium. Mass is hypointense to myocardium on T1 weighted imaging, hyperintense on T2 weighted imaging, does not suppress with fat saturation, no contrast uptake on first pass perfusion, and no late gadolinium enhancement. This is consistent with a pericardial cyst. 2. Pericardial LGE and hyperenhancement on T2 weighted imaging suggests pericardial inflammation, consistent with acute pericarditis 3. Small pericardial effusion measuring up to 7mm adjacent to LV l6mral wall 4. Normal LV size, wall thickness, and systolic function (EF 67%). No LGE to sugge02%myocardial scar 5.  Normal RV size and systolic function (EF 64%) 6.  Dilated asce77%ng aorta measuring 40mm Electronically S29md   By: Christopher  Schumann Oswaldo Milian1:26   EEG adult  Result Date: 02/16/2021 Yadav, Priyanka O, MD Lora Havens2 PM Patient Name: Cecil P Sparks MRN: [id number] HAJJARsy [id number] O Yadav ReferLora Havensrovider: Dr Carole Hall Date: 6/5/Irene Papion: 22.34 mins Patient history: 54yo F with syncope. [id number] evaluate for seizure Level of alertness: Awake, asleep AEDs during EEG study: TPM Technical aspects: This EEG study was done with scalp electrodes positioned according to the 10-20 International system of electrode placement. Electrical activity was acquired at a sampling rate of 500Hz and reviewed with a high frequency filter of 70Hz and a low frequency filter of 1Hz. EEG data were recorded continuously and digitally stored. Description: The posterior dominant rhythm consists of 9 Hz activity of moderate voltage (25-35 uV) seen predominantly in posterior head regions, symmetric and reactive to eye opening and eye closing. Sleep was characterized by vertex waves, sleep spindles (12 to 14 Hz), maximal frontocentral region. Physiologic photic driving was seen during photic stimulation.  Hyperventilation was not performed.   IMPRESSION: This study is within normal limits. No seizures or epileptiform discharges were seen throughout the recording. Priyanka O Yadav   ECHLora HavensLETE  Result Date: 02/16/2021    ECHOCARDIOGRAM REPORT   Patient Name:  Nafisah P Whisonant Date of Exam: 02/16/2021 Medical Rec #:  [id number]      Height:       70.0 in Accession #:    [id number]     Weight:       179.9 lb Date of Birth:  20-Jan-1966      BSA:          1.995 m Patient Age:    55 years       BP:           120/84 mmHg Patient Gender: F              HR:           83 bpm. Exam Location:  Inpatient Procedure: 2D Echo, Cardiac Doppler and Color Doppler Indications:    Syncope R55  History:        Patient has no prior history of Echocardiogram examinations.                 Signs/Symptoms:Chest Pain and Syncope. Acute idiopathic                 pericarditis, Pericardial cyst.  Sonographer:    Alvino Chapel RCS Referring Phys: [id number] [place]  1. Left ventricular ejection fraction, by estimation, is 60 to 65%. The left ventricle has normal function. The  left ventricle has no regional wall motion abnormalities. Left ventricular diastolic parameters were normal.  2. Right ventricular systolic function is normal. The right ventricular size is normal. There is normal pulmonary artery systolic pressure.  3. The mitral valve is normal in structure. No evidence of mitral valve regurgitation. No evidence of mitral stenosis.  4. The aortic valve is tricuspid. Aortic valve regurgitation is not visualized. No aortic stenosis is present.  5. The inferior vena cava is normal in size with greater than 50% respiratory variability, suggesting right atrial pressure of 3 mmHg. FINDINGS  Left Ventricle: Left ventricular ejection fraction, by estimation, is 60 to 65%. The left ventricle has normal function. The left ventricle has no regional wall motion abnormalities. The left ventricular internal cavity size was normal in size. There is  no left ventricular hypertrophy. Left ventricular diastolic parameters were normal. Normal left ventricular filling pressure. Right Ventricle: The right ventricular size is normal. No increase in right ventricular wall thickness. Right ventricular systolic function is normal. There is normal pulmonary artery systolic pressure. The tricuspid regurgitant velocity is 2.14 m/s, and  with an assumed right atrial pressure of 3 mmHg, the estimated right ventricular systolic pressure is 81.1 mmHg. Left Atrium: Left atrial size was normal in size. Right Atrium: Right atrial size was normal in size. Pericardium: There is no evidence of pericardial effusion. Mitral Valve: The mitral valve is normal in structure. No evidence of mitral valve regurgitation. No evidence of mitral valve stenosis. Tricuspid Valve: The tricuspid valve is normal in structure. Tricuspid valve regurgitation is mild . No evidence of tricuspid stenosis. Aortic Valve: The aortic valve is tricuspid. Aortic valve regurgitation is not visualized. No aortic stenosis is present. Pulmonic Valve:  The pulmonic valve was normal in structure. Pulmonic valve regurgitation is not visualized. No evidence of pulmonic stenosis. Aorta: The aortic root is normal in size and structure. Venous: The inferior vena cava is normal in size with greater than 50% respiratory variability, suggesting right atrial pressure of 3 mmHg. IAS/Shunts: No atrial level shunt detected by color flow Doppler.  LEFT VENTRICLE PLAX 2D LVIDd:  4.50 cm  Diastology LVIDs:         2.40 cm  LV e' medial:    8.70 cm/s LV PW:         0.90 cm  LV E/e' medial:  8.8 LV IVS:        0.90 cm  LV e' lateral:   11.00 cm/s LVOT diam:     2.00 cm  LV E/e' lateral: 7.0 LV SV:         84 LV SV Index:   42 LVOT Area:     3.14 cm  RIGHT VENTRICLE RV S prime:     14.10 cm/s TAPSE (M-mode): 2.0 cm LEFT ATRIUM             Index       RIGHT ATRIUM           Index LA diam:        3.00 cm 1.50 cm/m  RA Area:     19.40 cm LA Vol (A2C):   39.9 ml 20.00 ml/m RA Volume:   56.70 ml  28.42 ml/m LA Vol (A4C):   42.8 ml 21.45 ml/m LA Biplane Vol: 43.3 ml 21.70 ml/m  AORTIC VALVE LVOT Vmax:   117.00 cm/s LVOT Vmean:  79.000 cm/s LVOT VTI:    0.266 m  AORTA Ao Root diam: 3.40 cm MITRAL VALVE               TRICUSPID VALVE MV Area (PHT): 3.91 cm    TR Peak grad:   18.3 mmHg MV Decel Time: 194 msec    TR Vmax:        214.00 cm/s MV E velocity: 76.70 cm/s MV A velocity: 66.40 cm/s  SHUNTS MV E/A ratio:  1.16        Systemic VTI:  0.27 m                            Systemic Diam: 2.00 cm Dani Gobble Croitoru MD Electronically signed by Sanda Klein MD Signature Date/Time: 02/16/2021/3:06:02 PM    Final    VAS [place] LOWER EXTREMITY VENOUS (DVT)  Result Date: 02/16/2021  Lower Venous DVT Study Patient Name:  Shaunee P Moster  Date of Exam:   02/16/2021 Medical Rec #: [id number]       Accession #:    [id number] Date of Birth: 1966/03/12       Patient Gender: F Patient Age:   89Y Exam Location:  [facility] Procedure:      VAS [place] LOWER EXTREMITY VENOUS (DVT) Referring Phys:  [id number] [place] --------------------------------------------------------------------------------  Indications: Elevated Ddimer.  Risk Factors: None identified. Comparison Study: No prior studies. Performing Technologist: Oliver Hum RVT  Examination Guidelines: A complete evaluation includes B-mode imaging, spectral Doppler, color Doppler, and power Doppler as needed of all accessible portions of each vessel. Bilateral testing is considered an integral part of a complete examination. Limited examinations for reoccurring indications may be performed as noted. The reflux portion of the exam is performed with the patient in reverse Trendelenburg.  +---------+---------------+---------+-----------+----------+--------------+ RIGHT    CompressibilityPhasicitySpontaneityPropertiesThrombus Aging +---------+---------------+---------+-----------+----------+--------------+ CFV      Full           Yes      Yes                                 +---------+---------------+---------+-----------+----------+--------------+ SFJ  Full                                                        +---------+---------------+---------+-----------+----------+--------------+ FV Prox  Full                                                        +---------+---------------+---------+-----------+----------+--------------+ FV Mid   Full                                                        +---------+---------------+---------+-----------+----------+--------------+ FV DistalFull                                                        +---------+---------------+---------+-----------+----------+--------------+ PFV      Full                                                        +---------+---------------+---------+-----------+----------+--------------+ POP      Full           Yes      Yes                                  +---------+---------------+---------+-----------+----------+--------------+ PTV      Full                                                        +---------+---------------+---------+-----------+----------+--------------+ PERO     Full                                                        +---------+---------------+---------+-----------+----------+--------------+   +---------+---------------+---------+-----------+----------+--------------+ LEFT     CompressibilityPhasicitySpontaneityPropertiesThrombus Aging +---------+---------------+---------+-----------+----------+--------------+ CFV      Full           Yes      Yes                                 +---------+---------------+---------+-----------+----------+--------------+ SFJ      Full                                                        +---------+---------------+---------+-----------+----------+--------------+  FV Prox  Full                                                        +---------+---------------+---------+-----------+----------+--------------+ FV Mid   Full                                                        +---------+---------------+---------+-----------+----------+--------------+ FV DistalFull                                                        +---------+---------------+---------+-----------+----------+--------------+ PFV      Full                                                        +---------+---------------+---------+-----------+----------+--------------+ POP      Full           Yes      Yes                                 +---------+---------------+---------+-----------+----------+--------------+ PTV      Full                                                        +---------+---------------+---------+-----------+----------+--------------+ PERO     Full                                                         +---------+---------------+---------+-----------+----------+--------------+     Summary: RIGHT: - There is no evidence of deep vein thrombosis in the lower extremity.  - No cystic structure found in the popliteal fossa.  LEFT: - There is no evidence of deep vein thrombosis in the lower extremity.  - No cystic structure found in the popliteal fossa.  *See table(s) above for measurements and observations. Electronically signed by Ruta Hinds MD on 02/16/2021 at 5:08:43 PM.    Final     Microbiology: Recent Results (from the past 240 hour(s))  Resp Panel by RT-PCR (Flu A&B, Covid) Nasopharyngeal Swab     Status: None   Collection Time: 02/15/21  8:09 PM   Specimen: Nasopharyngeal Swab; Nasopharyngeal(NP) swabs in vial transport medium  Result Value Ref Range Status   SARS Coronavirus 2 by RT PCR NEGATIVE NEGATIVE Final    Comment: (NOTE) SARS-CoV-2 target nucleic acids are NOT DETECTED.  The SARS-CoV-2 RNA is generally detectable in upper respiratory specimens during the acute phase of infection. The lowest concentration of SARS-CoV-2  viral copies this assay can detect is 138 copies/mL. A negative result does not preclude SARS-Cov-2 infection and should not be used as the sole basis for treatment or other patient management decisions. A negative result may occur with  improper specimen collection/handling, submission of specimen other than nasopharyngeal swab, presence of viral mutation(s) within the areas targeted by this assay, and inadequate number of viral copies(<138 copies/mL). A negative result must be combined with clinical observations, patient history, and epidemiological information. The expected result is Negative.  Fact Sheet for Patients:  EntrepreneurPulse.com.au  Fact Sheet for Healthcare Providers:  IncredibleEmployment.be  This test is no t yet approved or cleared by the [place] FDA and  has been authorized for detection and/or  diagnosis of SARS-CoV-2 by FDA under an Emergency Use Authorization (EUA). This EUA will remain  in effect (meaning this test can be used) for the duration of the COVID-19 declaration under Section 564(b)(1) of the Act, 21 U.S.C.section 360bbb-3(b)(1), unless the authorization is terminated  or revoked sooner.       Influenza A by PCR NEGATIVE NEGATIVE Final   Influenza B by PCR NEGATIVE NEGATIVE Final    Comment: (NOTE) The Xpert Xpress SARS-CoV-2/FLU/RSV plus assay is intended as an aid in the diagnosis of influenza from Nasopharyngeal swab specimens and should not be used as a sole basis for treatment. Nasal washings and aspirates are unacceptable for Xpert Xpress SARS-CoV-2/FLU/RSV testing.  Fact Sheet for Patients: EntrepreneurPulse.com.au  Fact Sheet for Healthcare Providers: IncredibleEmployment.be  This test is not yet approved or cleared by the [place] FDA and has been authorized for detection and/or diagnosis of SARS-CoV-2 by FDA under an Emergency Use Authorization (EUA). This EUA will remain in effect (meaning this test can be used) for the duration of the COVID-19 declaration under Section 564(b)(1) of the Act, 21 U.S.C. section 360bbb-3(b)(1), unless the authorization is terminated or revoked.  Performed at [facility], [place] [address], [place], [place] [address]   Surgical PCR screen     Status: None   Collection Time: 02/20/21  7:34 AM   Specimen: Nasal Mucosa; Nasal Swab  Result Value Ref Range Status   MRSA, PCR NEGATIVE NEGATIVE Final   Staphylococcus aureus NEGATIVE NEGATIVE Final    Comment: (NOTE) The Xpert SA Assay (FDA approved for NASAL specimens in patients 79 years of age and older), is one component of a comprehensive surveillance program. It is not intended to diagnose infection nor to guide or monitor treatment. Performed at [facility], [place] [address] [place], [place] [address]    Aerobic/Anaerobic Culture w Gram Stain (surgical/deep wound)     Status: None (Preliminary result)   Collection Time: 02/20/21  2:28 PM   Specimen: PATH Cytology Pleural fluid; Body Fluid  Result Value Ref Range Status   Specimen Description FLUID PLEURAL RIGHT  Final   Special Requests NONE  Final   Gram Stain   Final    FEW WBC PRESENT, PREDOMINANTLY MONONUCLEAR NO ORGANISMS SEEN    Culture   Final    NO GROWTH < 24 HOURS Performed at [facility], [address], [place], [place] [address]    Report Status PENDING  Incomplete     Labs: Basic Metabolic Panel: Recent Labs  Lab 02/15/21 1502 02/16/21 0200 02/16/21 1052 02/19/21 1010 02/20/21 0259 02/21/21 0312  NA 140 138  --  140 139 137  K 3.9 3.7  --  3.7 4.3 3.7  CL 110 109  --  113* 113* 108  CO2 19* 20*  --  18* 19* 20*  GLUCOSE 100* 131*  --  113* 147* 130*  BUN 15 12  --  _0 CREATININE 1.04* 0.93 1.04* 1.00 1.07* 0.94  CALCIUM 9.7 8.9  --  8.8* 8.5* 8.6*  MG  --  1.8  --   --   --   --   PHOS  --  4.5  --   --   --   --    Liver Function Tests: Recent Labs  Lab 02/15/21 1951 02/20/21 0259  AST 24 39  ALT 38 49*  ALKPHOS 39 60  BILITOT 0.7 0.6  PROT 6.8 5.5*  ALBUMIN 3.9 2.8*   Recent Labs  Lab 02/15/21 1951  LIPASE 31   No results for input(s): AMMONIA in the last 168 hours. CBC: Recent Labs  Lab 02/15/21 1502 02/16/21 0200 02/16/21 1052 02/21/21 0312  WBC 11.5* 13.1* 10.5 11.5*  HGB 15.0 13.7 14.0 11.6*  HCT 46.3* 41.7 41.5 35.7*  MCV 97.3 96.8 95.4 97.3  PLT 204 186 170 200   Cardiac Enzymes: No results for input(s): CKTOTAL, CKMB, CKMBINDEX, TROPONINI in the last 168 hours. BNP: BNP (last 3 results) No results for input(s): BNP in the last 8760 hours.  ProBNP (last 3 results) No results for input(s): PROBNP in the last 8760 hours.  CBG: Recent Labs  Lab 02/19/21 0552 02/20/21 0611 02/20/21 2125 02/21/21 0607 02/21/21 1115  GLUCAP 111* 116* 145* 119* 127*        Signed:  Oswald Hillock MD.  [facility] 02/21/2021, 2:42 PM

## 2021-02-21 NOTE — Progress Notes (Signed)
      [address]       [place] [address]             [phone]      1 Day Post-Op Procedure(s) (LRB): XI ROBOTIC ASSISTED THORACOSCOPY PERICARDIAL CYST RESECTION (Right) INTERCOSTAL NERVE BLOCK (Right) Subjective: Feels okay this morning, chest tube was removed.  Objective: Vital signs in last 24 hours: Temp:  [97.6 F (36.4 C)-100 F (37.8 C)] 98.5 F (36.9 C) (06/10 0700) Pulse Rate:  [62-77] 62 (06/10 0800) Cardiac Rhythm: Normal sinus rhythm (06/10 0700) Resp:  [13-27] 13 (06/10 0800) BP: (110-155)/(70-86) 126/81 (06/10 0800) SpO2:  [95 %-100 %] 97 % (06/10 0800)    Intake/Output from previous day: 06/09 0701 - 06/10 0700 In: 2050.8 [P.O.:560; I.V.:1200; IV Piggyback:265.8] Out: 40 [Drains:15; Blood:25] Intake/Output this shift: No intake/output data recorded.  General appearance: alert, cooperative, and no distress Heart: regular rate and rhythm, S1, S2 normal, no murmur, click, rub or gallop Lungs: clear to auscultation bilaterally Abdomen: soft, non-tender; bowel sounds normal; no masses,  no organomegaly Extremities: extremities normal, atraumatic, no cyanosis or edema Wound: clean and dry  Lab Results: Recent Labs    02/21/21 0312  WBC 11.5*  HGB 11.6*  HCT 35.7*  PLT 200   BMET:  Recent Labs    02/20/21 0259 02/21/21 0312  NA 139 137  K 4.3 3.7  CL 113* 108  CO2 19* 20*  GLUCOSE 147* 130*  BUN 13 11  CREATININE 1.07* 0.94  CALCIUM 8.5* 8.6*    PT/INR: No results for input(s): LABPROT, INR in the last 72 hours. ABG No results found for: PHART, HCO3, TCO2, ACIDBASEDEF, O2SAT CBG (last 3)  Recent Labs    02/20/21 0611 02/20/21 2125 02/21/21 0607  GLUCAP 116* 145* 119*    Assessment/Plan: S/P Procedure(s) (LRB): XI ROBOTIC ASSISTED THORACOSCOPY PERICARDIAL CYST RESECTION (Right) INTERCOSTAL NERVE BLOCK (Right)  CV-NSR in the 60s, BP well controlled Pulm-tolerating room air with good oxygen saturation. CXR is  stable with left lower lobe opacity, probably atelectasis Renal-creatinine 0.94, electrolytes okay H and H 11.6/35.7, stable Endo-blood glucose well controlled  Plan: Chest tube removed. I will set up follow-up with Dr. Kipp Brood. Possible discharge today, instruction reviewed with the patient and husband at the bedside.     LOS: 4 days    Elgie Collard 02/21/2021

## 2021-02-22 LAB — ACID FAST SMEAR (AFB, MYCOBACTERIA): Acid Fast Smear: NEGATIVE

## 2021-02-24 LAB — BPAM RBC
Blood Product Expiration Date: 202206232359
Blood Product Expiration Date: 202206232359
Unit Type and Rh: 7300
Unit Type and Rh: 7300

## 2021-02-24 LAB — TYPE AND SCREEN
ABO/RH(D): B POS
Antibody Screen: NEGATIVE
Unit division: 0
Unit division: 0

## 2021-02-25 LAB — AEROBIC/ANAEROBIC CULTURE W GRAM STAIN (SURGICAL/DEEP WOUND)

## 2021-02-26 LAB — CYTOLOGY - NON PAP

## 2021-02-26 LAB — SURGICAL PATHOLOGY

## 2021-03-03 ENCOUNTER — Other Ambulatory Visit: Payer: Self-pay

## 2021-03-03 ENCOUNTER — Ambulatory Visit (INDEPENDENT_AMBULATORY_CARE_PROVIDER_SITE_OTHER): Payer: Self-pay | Admitting: Thoracic Surgery (Cardiothoracic Vascular Surgery)

## 2021-03-03 VITALS — BP 120/79 | HR 80 | Resp 20 | Ht 70.0 in | Wt 200.0 lb

## 2021-03-03 DIAGNOSIS — Z9889 Other specified postprocedural states: Secondary | ICD-10-CM

## 2021-03-03 NOTE — Progress Notes (Signed)
      [address]       [place] [address]             [phone]        Jill Jenkins [place] Medical Record #[id number] Date of Birth: [birth year]  Referring: Skeet Latch, MD Primary Care: Chesley Noon, MD Primary Cardiologist:Tiffany Oval Linsey, MD  Reason for visit:   follow-up  History of Present Illness:     [name] comes in for her 1 week follow-up appointment.  Overall she is doing well.  She does have some mild incisional pain.  She occasionally has some shortness of breath.  She is not using any of her pain medication.  Physical Exam: BP 120/79 (BP Location: Left Arm, Patient Position: Sitting)   Pulse 80   Resp 20   Ht 5\' 10"  (1.778 m)   Wt 200 lb (90.7 kg)   SpO2 98%   BMI 28.70 kg/m   Alert NAD Incision clean.   Abdomen soft, ND No peripheral edema   Diagnostic Studies & Laboratory data:  Path: Pericardial cyst.  Inflamed pericardial cyst    Assessment / Plan:   55 year old female status post right robotic assisted thoracoscopy with resection of inflamed pericardial cyst.  Overall doing well.  She is also being treated for pericarditis.  She will return to clinic in 1 month with a chest x-ray.   Lajuana Matte 03/03/2021 4:18 PM

## 2021-03-07 ENCOUNTER — Telehealth: Payer: Self-pay | Admitting: Cardiovascular Disease

## 2021-03-07 NOTE — Telephone Encounter (Signed)
Patient admitted to hospital and treated for pericarditis on 02/15/2021. Pericardial cyst resection performed 02/20/2021.  Treatment for pericarditis with ibuprofen and colchicine appropriate. Can take up to 3 month for symptoms resolution.  Recommendation: Continue current therapy Visit ER if fever, increase pain or new symptoms noted Keep follow up scheduled for 6/30 with Denyse Amass clever to discuss therapy and additional recommendations.

## 2021-03-07 NOTE — Telephone Encounter (Signed)
     Pt would like to speak with a nurse regarding her medications that was given to her while she was in the hospital

## 2021-03-07 NOTE — Telephone Encounter (Signed)
Spoke with pt, aware of the recommendations.  

## 2021-03-07 NOTE — Telephone Encounter (Signed)
Spoke with pt, she had an episode yesterday similar to the one that sent her to the hospital. She has pericarditis and is currently taking the ibuprofen and the colchicine. She had SOB and sharpe, stabbing pain in her chest. Advised patient that the medications she is taking is what is given for pericarditis but she would like for the pharmacist and dr Oval Linsey to review her medications to see if there is anything else she could take. She is also aware she can have symptoms up to 3 months after being diagnosed. Aware will forward to pharm md and dr Oval Linsey

## 2021-03-11 NOTE — Progress Notes (Signed)
Cardiology Clinic Note   Patient Name: Jill Jenkins Date of Encounter: 03/13/2021  Primary Care Provider:  Chesley Noon, MD Primary Cardiologist:  Skeet Latch, MD  Patient Profile    Jill Jenkins 55 year old female presents in clinic today status post pericardial cyst removal and for follow-up evaluation of her pericarditis.  Past Medical History    Past Medical History:  Diagnosis Date   Insomnia    Low back pain    Menorrhagia 03/28/2012   Migraines    Renal calculus    Past Surgical History:  Procedure Laterality Date   DILATION AND CURETTAGE OF UTERUS     INTERCOSTAL NERVE BLOCK Right 02/20/2021   Procedure: INTERCOSTAL NERVE BLOCK;  Surgeon: Lajuana Matte, MD;  Location: [facility];  Service: Thoracic;  Laterality: Right;   POLYPECTOMY  03/29/2012   Procedure: POLYPECTOMY;  Surgeon: Thornell Sartorius, MD;  Location: [facility];  Service: Gynecology;  Laterality: N/A;   XI ROBOTIC ASSISTED PERICARDIAL WINDOW Right 02/20/2021   Procedure: XI ROBOTIC ASSISTED THORACOSCOPY PERICARDIAL CYST RESECTION;  Surgeon: Lajuana Matte, MD;  Location: [facility];  Service: Thoracic;  Laterality: Right;    Allergies  Allergies  Allergen Reactions   No Known Allergies     History of Present Illness    Jill Jenkins has a PMH of syncope and chest discomfort.  She was found to have a larger pericardial cyst and acute pericarditis.  Echocardiogram 02/16/2021 showed LVEF 60 to 65%, no regional wall motion abnormalities.  Chest CTA 02/15/2021 showed no PE, no acute intrathoracic abnormality, 4.3 cm pericardial cyst.  She underwent robotic assisted thoracoscopy pericardial cyst resection 02/20/2021.  She tolerated surgery well.  She was discharged in stable condition on 02/21/2021.  She was placed on colchicine for 3 months and ibuprofen for 1 month.  She presents the clinic today for follow-up evaluation states she has noticed some numbness along her right flank.  She reports this was  resolving as she gets further away from surgery.  She also notes some abdominal bloating with meals.  She reports that her bowel movements are not as complete as they were prior to surgery.  She reports that she is trying to stay well-hydrated.  She has been eating small meals.  We reviewed her microbiology results, recommendations for increasing diet and physical activity as tolerated.  We reviewed her medication.  I have asked her to start taking simethicone, reviewed symptoms for worsening pericarditis and will have her follow-up with Dr. Oval Linsey in 3 months.    Today she denies chest pain, shortness of breath, lower extremity edema, fatigue, palpitations, melena, hematuria, hemoptysis, diaphoresis, weakness, presyncope, syncope, orthopnea, and PND.  Home Medications    Prior to Admission medications   Medication Sig Start Date End Date Taking? Authorizing Provider  colchicine 0.6 MG tablet Take 1 tablet (0.6 mg total) by mouth daily. Stop after 3 months 02/21/21 05/22/21  Oswald Hillock, MD  eletriptan (RELPAX) 40 MG tablet Take 1 tablet (40 mg total) by mouth as needed for migraine or headache (For acute migraine treatment; may repeat x1 PRN >2 hrs; max dose 80mg /24hr). 01/09/21   Frann Rider, NP  estradiol (ESTRACE) 0.1 MG/GM vaginal cream Place 1 Applicatorful vaginally 3 (three) times a week.    [provider]  ibuprofen (ADVIL) 800 MG tablet Take 1 tablet (800 mg total) by mouth in the morning, at noon, and at bedtime. Stop after one month 02/21/21 03/23/21  Oswald Hillock,  MD  pantoprazole (PROTONIX) 40 MG tablet Take 1 tablet (40 mg total) by mouth 2 (two) times daily. Stop after one month 02/21/21 03/23/21  Oswald Hillock, MD  topiramate (TOPAMAX) 50 MG tablet Take 1 tablet (50 mg total) by mouth 2 (two) times daily. Patient taking differently: Take 25 mg by mouth 2 (two) times daily. 01/09/21   Frann Rider, NP  Tretinoin Microsphere 0.06 % GEL Retin-A Micro Pump 0.06 % topical gel   1 APPLICATION THIN LAYER TO FACE NIGHTLY    [provider]    Family History    Family History  Problem Relation Age of Onset   Hypertension Mother    Hypertension Father    Stroke Maternal Grandfather    She indicated that her mother is alive. She indicated that her father is deceased. She indicated that her brother is alive. She indicated that her maternal grandfather is deceased.  Social History    Social History   Socioeconomic History   Marital status: Married    Spouse name: Not on file   Number of children: Not on file   Years of education: Not on file   Highest education level: Not on file  Occupational History   Not on file  Tobacco Use   Smoking status: Never   Smokeless tobacco: Never  Substance and Sexual Activity   Alcohol use: No   Drug use: No   Sexual activity: Yes    Birth control/protection: Surgical  Other Topics Concern   Not on file  Social History Narrative   Not on file   Social Determinants of Health   Financial Resource Strain: Not on file  Food Insecurity: Not on file  Transportation Needs: Not on file  Physical Activity: Not on file  Stress: Not on file  Social Connections: Not on file  Intimate Partner Violence: Not on file     Review of Systems    General:  No chills, fever, night sweats or weight changes.  Cardiovascular:  No chest pain, dyspnea on exertion, edema, orthopnea, palpitations, paroxysmal nocturnal dyspnea. Dermatological: No rash, lesions/masses Respiratory: No cough, dyspnea Urologic: No hematuria, dysuria Abdominal:   No nausea, vomiting, diarrhea, bright red blood per rectum, melena, or hematemesis Neurologic:  No visual changes, wkns, changes in mental status. All other systems reviewed and are otherwise negative except as noted above.  Physical Exam    VS:  BP (!) 122/92 (BP Location: Left Arm)   Pulse 84   Ht 5\' 10"  (1.778 m)   Wt 200 lb (90.7 kg)   SpO2 99%   BMI 28.70 kg/m  , BMI Body mass  index is 28.7 kg/m. GEN: Well nourished, well developed, in no acute distress. HEENT: normal. Neck: Supple, no JVD, carotid bruits, or masses. Cardiac: RRR, no murmurs, rubs, or gallops. No clubbing, cyanosis, edema.  Radials/DP/PT 2+ and equal bilaterally.  Respiratory:  Respirations regular and unlabored, clear to auscultation bilaterally. GI: Soft, nontender, nondistended, BS + x 4. MS: no deformity or atrophy. Skin: warm and dry, no rash. Neuro:  Strength and sensation are intact. Psych: Normal affect.  Accessory Clinical Findings    Recent Labs: 02/16/2021: Magnesium 1.8; TSH 1.907 02/20/2021: ALT 49 02/21/2021: BUN 11; Creatinine, Ser 0.94; Hemoglobin 11.6; Platelets 200; Potassium 3.7; Sodium 137   Recent Lipid Panel    Component Value Date/Time   CHOL 192 02/16/2021 0200   TRIG 133 02/16/2021 0200   HDL 48 02/16/2021 0200   CHOLHDL 4.0 02/16/2021 0200  VLDL 27 02/16/2021 0200   LDLCALC 117 (H) 02/16/2021 0200    ECG personally reviewed by me today-none today.  Echocardiogram 02/16/2021  1. Left ventricular ejection fraction, by estimation, is 60 to 65%. The  left ventricle has normal function. The left ventricle has no regional  wall motion abnormalities. Left ventricular diastolic parameters were  normal.   2. Right ventricular systolic function is normal. The right ventricular  size is normal. There is normal pulmonary artery systolic pressure.   3. The mitral valve is normal in structure. No evidence of mitral valve  regurgitation. No evidence of mitral stenosis.   4. The aortic valve is tricuspid. Aortic valve regurgitation is not  visualized. No aortic stenosis is present.   5. The inferior vena cava is normal in size with greater than 50%  respiratory variability, suggesting right atrial pressure of 3 mmHg.  Chest CTA 02/15/2021  IMPRESSION: 1. No pulmonary embolus. 2. No acute intrathoracic abnormality. 3. Incidentally noted likely 4.3 cm pericardial  cyst. 4. Multiple fluid density lesions within the liver statistically likely represent hepatic cysts.  Cardiac MRI 02/17/2021  IMPRESSION: 1. There is a 22mm x 38mm x 69mm mass adjacent to the right atrium. Mass is hypointense to myocardium on T1 weighted imaging, hyperintense on T2 weighted imaging, does not suppress with fat saturation, no contrast uptake on first pass perfusion, and no late gadolinium enhancement. This is consistent with a pericardial cyst.   2. Pericardial LGE and hyperenhancement on T2 weighted imaging suggests pericardial inflammation, consistent with acute pericarditis   3. Small pericardial effusion measuring up to 30mm adjacent to LV lateral wall   4. Normal LV size, wall thickness, and systolic function (EF 79%). No LGE to suggest myocardial scar   5.  Normal RV size and systolic function (EF 03%)   6.  Dilated ascending aorta measuring 69mm   Assessment & Plan   1.  Pericardial cyst/acute pericarditis-denies chest pain, shortness of breath, presyncope and syncope.  Underwent robotic assisted pericardial cyst removal 02/20/2021.  She was placed on colchicine x3 months and ibuprofen x1 month.  No abnormal cells seen from her pericardial cyst.  Inflammatory cells noted.  Tolerating therapy well.   Continue colchicine, ibuprofen Heart healthy low-sodium diet-salty 6 given  Ascending aortic aneurysm-40 mm aneurysm noted on cardiac MRI.  Blood pressure remains well controlled. Repeat echocardiogram 6/23  SVT-no recurrent episodes of accelerated heart rate.  Felt to be related to pericarditis from pericardial cyst/surgery. Continue to monitor.  Abdominal distention/bloating-has noticed over the past several days she is having increased bloating. Simethicone 250 mg with meals as needed Maintain p.o. hydration Heart healthy low-sodium high-fiber diet Increase physical activity as tolerated  Disposition: Follow-up with Dr. Oval Linsey in 3 months.  Jossie Ng.  Obie Silos NP-C    03/13/2021, 3:38 PM [facility] [address] Office [phone] Fax [phone]  Notice: This dictation was prepared with Dragon dictation along with smaller phrase technology. Any transcriptional errors that result from this process are unintentional and may not be corrected upon review.  I spent 15 minutes examining this patient, reviewing medications, and using patient centered shared decision making involving her cardiac care.  Prior to her visit I spent greater than 20 minutes reviewing her past medical history,  medications, and prior cardiac tests.

## 2021-03-13 ENCOUNTER — Other Ambulatory Visit: Payer: Self-pay

## 2021-03-13 ENCOUNTER — Encounter: Payer: Self-pay | Admitting: General Practice

## 2021-03-13 ENCOUNTER — Ambulatory Visit (INDEPENDENT_AMBULATORY_CARE_PROVIDER_SITE_OTHER): Payer: BC Managed Care – PPO | Admitting: General Practice

## 2021-03-13 VITALS — BP 122/92 | HR 84 | Ht 70.0 in | Wt 200.0 lb

## 2021-03-13 DIAGNOSIS — R140 Abdominal distension (gaseous): Secondary | ICD-10-CM

## 2021-03-13 DIAGNOSIS — Q248 Other specified congenital malformations of heart: Secondary | ICD-10-CM

## 2021-03-13 DIAGNOSIS — I308 Other forms of acute pericarditis: Secondary | ICD-10-CM

## 2021-03-13 DIAGNOSIS — I471 Supraventricular tachycardia: Secondary | ICD-10-CM | POA: Diagnosis not present

## 2021-03-13 DIAGNOSIS — I712 Thoracic aortic aneurysm, without rupture: Secondary | ICD-10-CM

## 2021-03-13 DIAGNOSIS — I7121 Aneurysm of the ascending aorta, without rupture: Secondary | ICD-10-CM

## 2021-03-13 MED ORDER — COLCHICINE 0.6 MG PO TABS: 0.6000 mg | ORAL_TABLET | Freq: Every day | ORAL | 1 refills | Status: DC

## 2021-03-13 NOTE — Patient Instructions (Signed)
Medication Instructions:  CONTINUE COLCHICINE FOR 3 MONTHS  STOP TAKING IBUPROFEN 03-23-2021 *If you need a refill on your cardiac medications before your next appointment, please call your pharmacy*  Lab Work: NONE  Testing/Procedures: Echocardiogram (DUE IN 02-2022) - Your physician has requested that you have an echocardiogram. Echocardiography is a painless test that uses sound waves to create images of your heart. It provides your doctor with information about the size and shape of your heart and how well your heart's chambers and valves are working. This procedure takes approximately one hour. There are no restrictions for this procedure. This will be performed at our [facility] location - [address].  Special Instructions IF SHORTNESS-OF-BREATH OR CHEST PAIN DO NOT RESOLVE I OR THEY WORSEN PLEASE LET us KNOW.  PLEASE INCREASE PHYSICAL ACTIVITY AS TOLERATED  PLEASE READ AND FOLLOW FIBER DIET-ATTACHED  Follow-Up: Your next appointment:  07-02-2021  @ 3PM   In Person with Skeet Latch, MD AT [facility]; Address: [address], [place], [place] [address] Phone: [phone]  At [facility], you and your health needs are our priority.  As part of our continuing mission to provide you with exceptional heart care, we have created designated Provider Care Teams.  These Care Teams include your primary Cardiologist (physician) and Advanced Practice Providers (APPs -  Physician Assistants and Nurse Practitioners) who all work together to provide you with the care you need, when you need it.  We recommend signing up for the patient portal called "MyChart".  Sign up information is provided on this After Visit Summary.  MyChart is used to connect with patients for Virtual Visits (Telemedicine).  Patients are able to view lab/test results, encounter notes, upcoming appointments, etc.  Non-urgent messages can be sent to your provider as well.   To learn more about  what you can do with MyChart, go to [email or web address].      High-Fiber Eating Plan Fiber, also called dietary fiber, is a type of carbohydrate. It is found foods such as fruits, vegetables, whole grains, and beans. A high-fiber diet can have many health benefits. Your health care provider may recommend a high-fiber diet to help: Prevent constipation. Fiber can make your bowel movements more regular. Lower your cholesterol. Relieve the following conditions: Inflammation of veins in the anus (hemorrhoids). Inflammation of specific areas of the digestive tract (uncomplicated diverticulosis). A problem of the large intestine, also called the colon, that sometimes causes pain and diarrhea (irritable bowel syndrome, or IBS). Prevent overeating as part of a weight-loss plan. Prevent heart disease, type 2 diabetes, and certain cancers. What are tips for following this plan? Reading food labels  Check the nutrition facts label on food products for the amount of dietary fiber. Choose foods that have 5 grams of fiber or more per serving.  Shopping Choose whole fruits and vegetables instead of processed forms, such as apple juice or applesauce. Choose a wide variety of high-fiber foods such as avocados, lentils, oats, and kidney beans. Read the nutrition facts label of the foods you choose. Be aware of foods with added fiber. These foods often have high sugar and sodium amounts per serving. Cooking Use whole-grain flour for baking and cooking. Cook with brown rice instead of white rice. Meal planning Start the day with a breakfast that is high in fiber, such as a cereal that contains 5 g of fiber or more per serving. Eat breads and cereals that are made with whole-grain flour instead of  refined flour or white flour. Eat brown rice, bulgur wheat, or millet instead of white rice. Use beans in place of meat in soups, salads, and pasta dishes. Be sure that half of the grains you eat each day are  whole grains. General information You can get the recommended daily intake of dietary fiber by: Eating a variety of fruits, vegetables, grains, nuts, and beans. Taking a fiber supplement if you are not able to take in enough fiber in your diet. It is better to get fiber through food than from a supplement. Gradually increase how much fiber you consume. If you increase your intake of dietary fiber too quickly, you may have bloating, cramping, or gas. Drink plenty of water to help you digest fiber. Choose high-fiber snacks, such as berries, raw vegetables, nuts, and popcorn. What foods should I eat? Fruits Berries. Pears. Apples. Oranges. Avocado. Prunes and raisins. Dried figs. Vegetables Sweet potatoes. Spinach. Kale. Artichokes. Cabbage. Broccoli. Cauliflower.Green peas. Carrots. Squash. Grains Whole-grain breads. Multigrain cereal. Oats and oatmeal. Brown rice. Barley.Bulgur wheat. [place]. Quinoa. Bran muffins. Popcorn. Rye wafer crackers. Meats and other proteins Navy beans, kidney beans, and pinto beans. Soybeans. Split peas. Lentils. Nutsand seeds. Dairy Fiber-fortified yogurt. Beverages Fiber-fortified soy milk. Fiber-fortified orange juice. Other foods Fiber bars. The items listed above may not be a complete list of recommended foods and beverages. Contact a dietitian for more information. What foods should I avoid? Fruits Fruit juice. Cooked, strained fruit. Vegetables Fried potatoes. Canned vegetables. Well-cooked vegetables. Grains White bread. Pasta made with refined flour. White rice. Meats and other proteins Fatty cuts of meat. Fried chicken or fried fish. Dairy Milk. Yogurt. Cream cheese. Sour cream. Fats and oils Butters. Beverages Soft drinks. Other foods Cakes and pastries. The items listed above may not be a complete list of foods and beverages to avoid. Talk with your dietitian about what choices are best for you. Summary Fiber is a type of carbohydrate.  It is found in foods such as fruits, vegetables, whole grains, and beans. A high-fiber diet has many benefits. It can help to prevent constipation, lower blood cholesterol, aid weight loss, and reduce your risk of heart disease, diabetes, and certain cancers. Increase your intake of fiber gradually. Increasing fiber too quickly may cause cramping, bloating, and gas. Drink plenty of water while you increase the amount of fiber you consume. The best sources of fiber include whole fruits and vegetables, whole grains, nuts, seeds, and beans. This information is not intended to replace advice given to you by your health care provider. Make sure you discuss any questions you have with your healthcare provider. Document Revised: 01/04/2020 Document Reviewed: 01/04/2020 Elsevier Patient Education  2022 [employer].

## 2021-03-24 LAB — FUNGUS CULTURE WITH STAIN

## 2021-03-24 LAB — FUNGAL ORGANISM REFLEX

## 2021-03-24 LAB — FUNGUS CULTURE RESULT

## 2021-03-25 ENCOUNTER — Other Ambulatory Visit: Payer: Self-pay

## 2021-03-25 DIAGNOSIS — R079 Chest pain, unspecified: Secondary | ICD-10-CM

## 2021-03-26 ENCOUNTER — Ambulatory Visit
Admission: RE | Admit: 2021-03-26 | Discharge: 2021-03-26 | Disposition: A | Discharge status: Home / Self Care | Payer: BC Managed Care – PPO | Source: Ambulatory Visit | Attending: Thoracic Surgery (Cardiothoracic Vascular Surgery) | Admitting: Thoracic Surgery (Cardiothoracic Vascular Surgery)

## 2021-03-26 DIAGNOSIS — R079 Chest pain, unspecified: Secondary | ICD-10-CM

## 2021-04-08 LAB — ACID FAST CULTURE WITH REFLEXED SENSITIVITIES (MYCOBACTERIA): Acid Fast Culture: NEGATIVE

## 2021-04-10 ENCOUNTER — Other Ambulatory Visit: Payer: Self-pay | Admitting: Thoracic Surgery (Cardiothoracic Vascular Surgery)

## 2021-04-10 DIAGNOSIS — I3131 Malignant pericardial effusion in diseases classified elsewhere: Secondary | ICD-10-CM

## 2021-04-10 DIAGNOSIS — C801 Malignant (primary) neoplasm, unspecified: Secondary | ICD-10-CM

## 2021-04-11 ENCOUNTER — Telehealth (INDEPENDENT_AMBULATORY_CARE_PROVIDER_SITE_OTHER): Payer: Self-pay | Admitting: Thoracic Surgery (Cardiothoracic Vascular Surgery)

## 2021-04-11 ENCOUNTER — Other Ambulatory Visit: Payer: Self-pay

## 2021-04-11 ENCOUNTER — Encounter: Payer: Self-pay | Admitting: Thoracic Surgery (Cardiothoracic Vascular Surgery)

## 2021-04-11 DIAGNOSIS — Q248 Other specified congenital malformations of heart: Secondary | ICD-10-CM

## 2021-04-11 NOTE — Progress Notes (Signed)
     [address]       [place] [address]             [phone]       Patient: Home Provider: Office Consent for Telemedicine visit obtained.  Today's visit was completed via a real-time telehealth (see specific modality noted below). The patient/authorized person provided oral consent at the time of the visit to engage in a telemedicine encounter with the present provider at [facility]. The patient/authorized person was informed of the potential benefits, limitations, and risks of telemedicine. The patient/authorized person expressed understanding that the laws that protect confidentiality also apply to telemedicine. The patient/authorized person acknowledged understanding that telemedicine does not provide emergency services and that he or she would need to call 911 or proceed to the nearest hospital for help if such a need arose.   Total time spent in the clinical discussion 10 minutes.  Telehealth Modality: Phone visit (audio only)  I had a telephone visit with Jill Jenkins.  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  Overall she is doing well.  She has only had 1 episode of pleuritic chest pain since last appointment.  Her incisions are healing well.  She has no complaints.  Clear from surgical standpoint. Will continue to follow-up with Dr. Oval Linsey.

## 2021-04-22 ENCOUNTER — Telehealth: Payer: Self-pay | Admitting: Adult Health

## 2021-04-22 NOTE — Telephone Encounter (Signed)
Pt called stating that she is on day 6 now with a migraine and her eletriptan (RELPAX) 40 MG tablet is not working for her. Pt would like to discuss with RN. Please advise.

## 2021-04-22 NOTE — Telephone Encounter (Signed)
Contacted pt back, she stated June 2nd she thought she was having a heart attack, went into the ED, it was pericarditis, and on the 9th they removed stints they thought that was causing her problems. They gave her 800 mg Ibuprofen. She said she did take the nurtec you gave her today around 10 am and it weaken the severity of the migraine but it is still there. I did advise her to mention this to Cardio as well considering she stated that the migraines increased only after she had the stints removed.  What do you advise ?

## 2021-04-23 NOTE — Telephone Encounter (Signed)
Dr. Oval Linsey, Any concern with steroid Dosepak for acute migraine flare up in setting of recent cardiac issues as well as increase of topamax dose?

## 2021-04-23 NOTE — Telephone Encounter (Signed)
Looks like cyst removed 6/9. Have migraines been consistent over the past 2 months or just over the past 6 days (per phone room note)? We could try to do steroid Dosepak for acute flareup but this would need to be cleared by cardiology first. If cardiology okay with this, I will place order. If not, we can consider increasing topiramate dosage.

## 2021-04-23 NOTE — Telephone Encounter (Signed)
No, as stated migraines have only increased since the surgery, but has had one consistently over the last 6 days. Will contact pt to see which option she is best interested in.

## 2021-04-23 NOTE — Telephone Encounter (Signed)
Contacted pt back, she stated she is fine with either that you think is best, she is open to the increase of topiramate or receiving clearance, whichever you would like to try first. A increase would be her fastest option of relief as getting clearance may take little longer but she will wait if need.

## 2021-05-12 NOTE — Telephone Encounter (Signed)
Called patient, left detailed VM, apologized for delay in reply. Advised her of NP's message and requested she call us back.

## 2021-05-12 NOTE — Telephone Encounter (Signed)
Can we reach out to the patient to see how she is doing? There was a delay with response from cardiology and over looked following up with her. Okay to do steroid pack and increase Topamax dose if still needed.  Thank you.

## 2021-05-13 MED ORDER — TOPIRAMATE 25 MG PO TABS: 25.0000 mg | ORAL_TABLET | Freq: Two times a day (BID) | ORAL | 2 refills | Status: DC

## 2021-05-13 NOTE — Telephone Encounter (Signed)
Pt called back. She sts headaches are not as controlled as she would like. She would like to try increasing the Topamax at this point. Per VO from JM,NP ok to increase to Topamax 75 mg bid.   I have sent 25 mg tablet of Topamax 1 bid to take in combination with the 50 mg bid she is already taking.   Pt verbalized understanding of instruction and will call back in a few weeks to let us know how she is doing. Pharmacy also notified of this change and rx has been sent to [facility].  Pt will keep f/u as scheduled for 07/17/21.

## 2021-05-13 NOTE — Addendum Note (Signed)
Addended by: Verlin Grills on: 05/13/2021 09:32 AM   Modules accepted: Orders

## 2021-05-13 NOTE — Telephone Encounter (Signed)
I called pt. No answer, left a message asking pt to call me back.  Advised via vm again on the delay in response and if she still felt like steroid pack and increase in topamax was beneficial we could help coordinate.  Will wait to hear back from the pt at this point.  Phone staff can relay message if pt calls back and clinic staff is not available.

## 2021-05-13 NOTE — Telephone Encounter (Signed)
Thank you :)

## 2021-06-17 ENCOUNTER — Telehealth: Payer: Self-pay

## 2021-06-17 NOTE — Telephone Encounter (Signed)
Patient contacted the office concerned about chest pressure, shortness of breath described as "heartburn". She is s/p RATS pericardial cyst resection with Dr. Kipp Brood 02/20/21. Advised patient to contact her Cardiologist for further evaluation and work-up she acknowledged receipt.

## 2021-06-21 IMAGING — DX DG CHEST 1V PORT
1 series · 1 of 1 positions shown · non-contrast
Comparison: 02/15/2021

CLINICAL DATA: Status post robot assisted surgery

EXAM:
PORTABLE CHEST 1 VIEW

[chest]
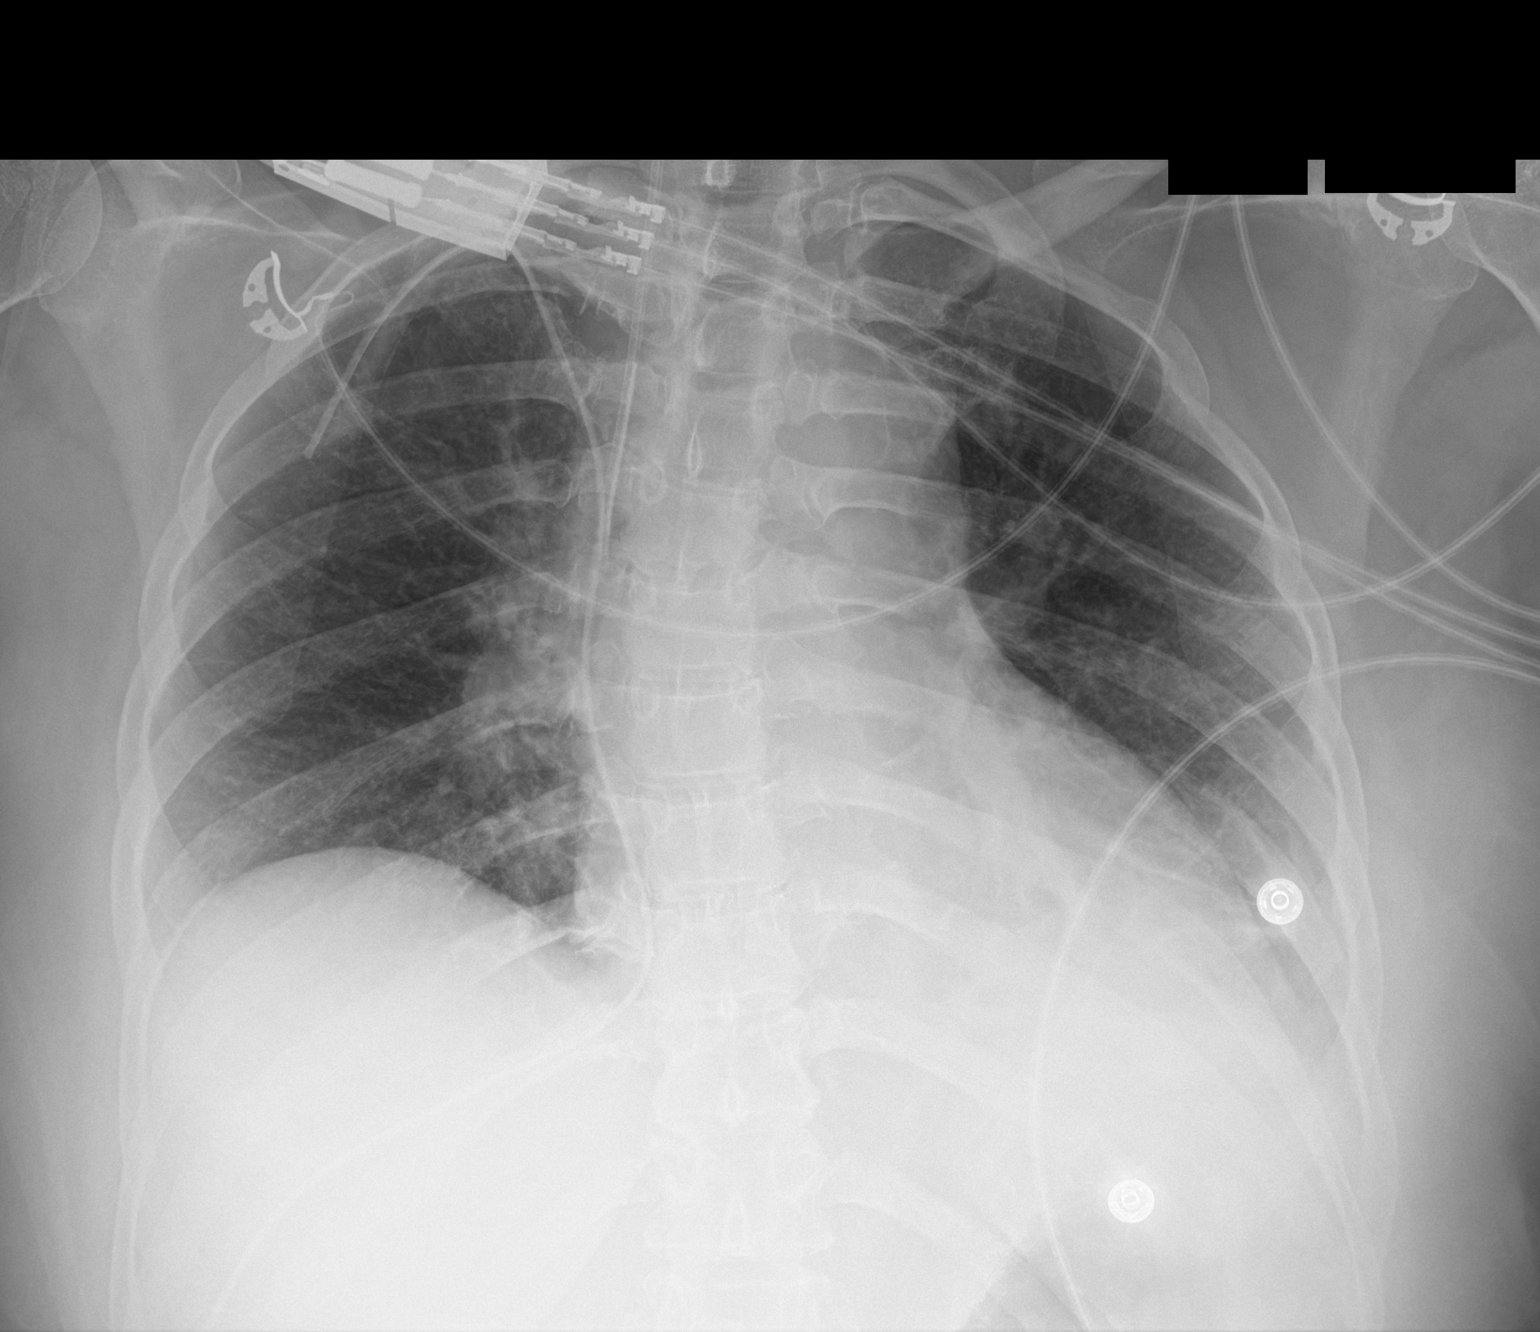

[1 of 1 positions shown; findings below may reference images not displayed]

FINDINGS: Right internal jugular central line tip in the SVC 5 cm above the
right atrium. Soft tissue drain overlies the mid chest. Right lung
is clear. There is moderate atelectasis in the left lower lobe. No
pneumothorax.
IMPRESSION: Moderate atelectasis in the left lower lobe.

## 2021-06-22 IMAGING — DX DG CHEST 1V PORT
1 series · 1 of 1 positions shown · non-contrast
Comparison: February 20, 2021

CLINICAL DATA: Postoperative pericardial window placement

EXAM:
PORTABLE CHEST 1 VIEW

[chest]
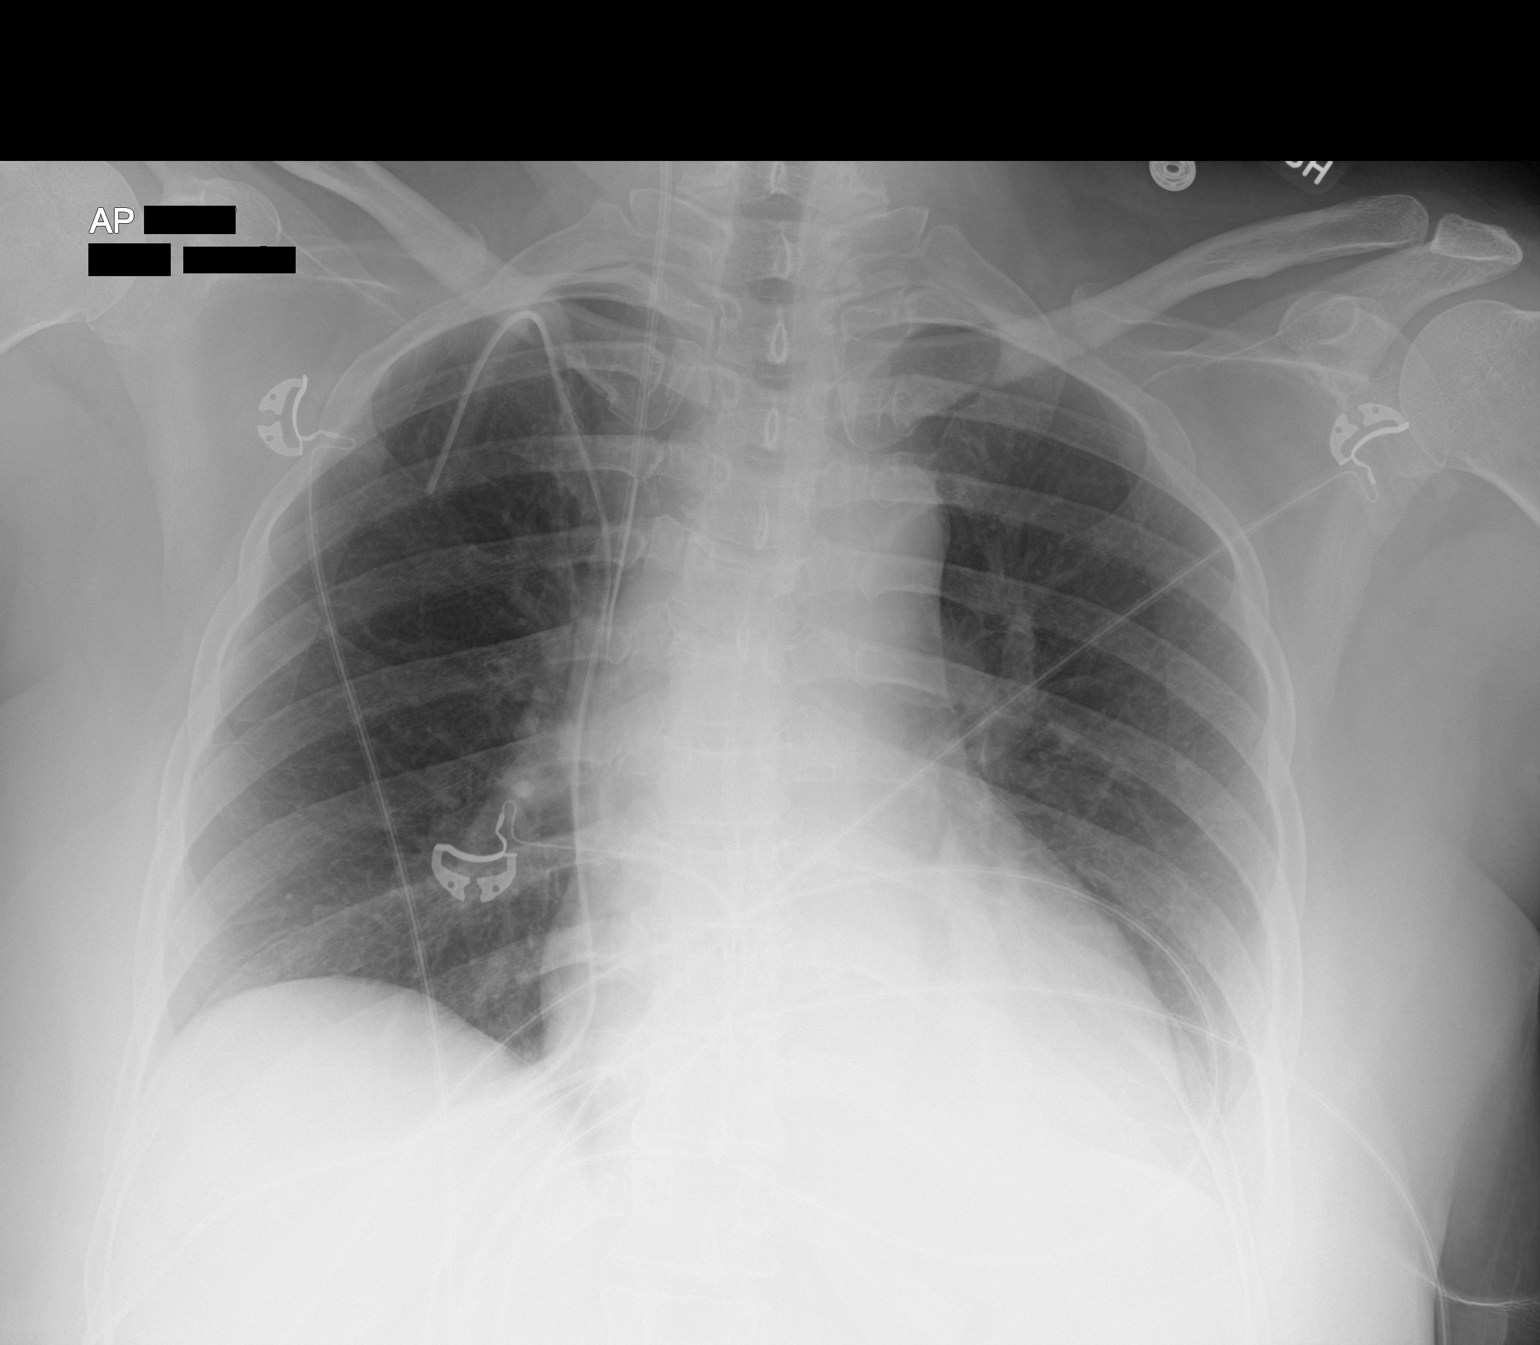

[1 of 1 positions shown; findings below may reference images not displayed]

FINDINGS: Central catheter tip is in the superior vena cava. There is a chest
tube on the right. No pneumothorax. There is airspace opacity in the
left lower lobe, stable. Lungs otherwise clear. Heart is slightly
enlarged with pulmonary vascularity normal. No adenopathy. No bone
lesions.
IMPRESSION: Tube and catheter positions as described without pneumothorax.
Opacity left lower lobe likely due primarily to atelectasis,
although superimposed pneumonia in the left base cannot be excluded.
Lungs elsewhere clear. Heart mildly enlarged.

## 2021-07-02 ENCOUNTER — Encounter: Payer: Self-pay | Admitting: *Deleted

## 2021-07-02 ENCOUNTER — Ambulatory Visit (INDEPENDENT_AMBULATORY_CARE_PROVIDER_SITE_OTHER): Payer: BC Managed Care – PPO | Admitting: Cardiovascular Disease

## 2021-07-02 ENCOUNTER — Encounter (HOSPITAL_BASED_OUTPATIENT_CLINIC_OR_DEPARTMENT_OTHER): Payer: Self-pay | Admitting: Cardiovascular Disease

## 2021-07-02 ENCOUNTER — Other Ambulatory Visit: Payer: Self-pay

## 2021-07-02 DIAGNOSIS — R002 Palpitations: Secondary | ICD-10-CM | POA: Diagnosis not present

## 2021-07-02 DIAGNOSIS — Q248 Other specified congenital malformations of heart: Secondary | ICD-10-CM

## 2021-07-02 DIAGNOSIS — I300 Acute nonspecific idiopathic pericarditis: Secondary | ICD-10-CM | POA: Diagnosis not present

## 2021-07-02 HISTORY — DX: Palpitations: R00.2

## 2021-07-02 NOTE — Progress Notes (Signed)
Patient ID: Jill Jenkins, female   DOB: [birth year], 55 y.o.   MRN: [id number] Patient enrolled for [employer] to ship a 30 day cardiac event monitor to her address on file.

## 2021-07-02 NOTE — Patient Instructions (Addendum)
Medication Instructions:  Your physician recommends that you continue on your current medications as directed. Please refer to the Current Medication list given to you today.   *If you need a refill on your cardiac medications before your next appointment, please call your pharmacy*  Lab Work: CBC/CMET/TSH/FT4/MAGNESIUM TODAY   If you have labs (blood work) drawn today and your tests are completely normal, you will receive your results only by: Julian (if you have MyChart) OR A paper copy in the mail If you have any lab test that is abnormal or we need to change your treatment, we will call you to review the results.  Testing/Procedures: Your physician has recommended that you wear an event monitor. Event monitors are medical devices that record the heart's electrical activity. Doctors most often [place] these monitors to diagnose arrhythmias. Arrhythmias are problems with the speed or rhythm of the heartbeat. The monitor is a small, portable device. You can wear one while you do your normal daily activities. This is usually used to diagnose what is causing palpitations/syncope (passing out). THIS WILL BE MAILED TO YOU   Your physician has requested that you have a cardiac MRI. Cardiac MRI uses a computer to create images of your heart as its beating, producing both still and moving pictures of your heart and major blood vessels. For further information please visit [email or web address] Please follow the instruction sheet given to you today for more information.  Follow-Up: At [facility], you and your health needs are our priority.  As part of our continuing mission to provide you with exceptional heart care, we have created designated Provider Care Teams.  These Care Teams include your primary Cardiologist (physician) and Advanced Practice Providers (APPs -  Physician Assistants and Nurse Practitioners) who all work together to provide you with the care you need, when you need it.  We  recommend signing up for the patient portal called "MyChart".  Sign up information is provided on this After Visit Summary.  MyChart is used to connect with patients for Virtual Visits (Telemedicine).  Patients are able to view lab/test results, encounter notes, upcoming appointments, etc.  Non-urgent messages can be sent to your provider as well.   To learn more about what you can do with MyChart, go to [email or web address].    Your next appointment:   3 month(s)  The format for your next appointment:   In Person  Provider:   Skeet Latch, MD  Other Instructions ? [facility] [place], [place] [address] [phone]  Please take advantage of the free valet parking available at the MAIN entrance (A entrance). Proceed to the [facility] Radiology Department (First Floor). ? Magnetic resonance imaging (MRI) is a painless test that produces images of the inside of the body without using Xrays.  During an MRI, strong magnets and radio waves work together in a Research officer, political party to form detailed images.   MRI images may provide more details about a medical condition than X-rays, CT scans, and ultrasounds can provide.  You may be given earphones to listen for instructions.  You may eat a light breakfast and take medications as ordered with the exception of HCTZ (fluid pill, other). Please avoid stimulants for 12 hr prior to test. (Ie. Caffeine, nicotine, chocolate, or antihistamine medications)  If a contrast material will be used, an IV will be inserted into one of your veins. Contrast material will be injected into your IV. It will leave your body through your urine  within a day. You may be told to drink plenty of fluids to help flush the contrast material out of your system.  You will be asked to remove all metal, including: Watch, jewelry, and other metal objects including hearing aids, hair pieces and dentures. Also wearable glucose monitoring systems  (ie. Freestyle Libre and Omnipods) (Braces and fillings normally are not a problem.)   TEST WILL TAKE APPROXIMATELY 1 HOUR  PLEASE NOTIFY SCHEDULING AT LEAST 24 HOURS IN ADVANCE IF YOU ARE UNABLE TO KEEP YOUR APPOINTMENT. [phone]  Please call Marchia Bond, cardiac imaging nurse navigator with any questions/concerns. Marchia Bond RN Navigator Cardiac Imaging Gordy Clement RN Navigator Cardiac Imaging [facility] [phone] Office   [employer] Cardiac Event Monitor Instructions Your physician has requested you wear your cardiac event monitor for __30__ days, (1-30). [employer] may call or text to confirm a shipping address. The monitor will be sent to a land address via UPS. [employer] will not ship a monitor to a PO BOX. It typically takes 3-5 days to receive your monitor after it has been enrolled. [employer] will assist with USPS tracking if your package is delayed. The telephone number for [employer] is [phone]. Once you have received your monitor, please review the enclosed instructions. Instruction tutorials can also be viewed under help and settings on the enclosed cell phone. Your monitor has already been registered assigning a specific monitor serial # to you.  Applying the monitor Remove cell phone from case and turn it on. The cell phone works as Dealer and needs to be within [employer] of you at all times. The cell phone will need to be charged on a daily basis. We recommend you plug the cell phone into the enclosed charger at your bedside table every night.  Monitor batteries: You will receive two monitor batteries labelled #1 and #2. These are your recorders. Plug battery #2 onto the second connection on the enclosed charger. Keep one battery on the charger at all times. This will keep the monitor battery deactivated. It will also keep it fully charged for when you need to switch your monitor batteries. A small light will be  blinking on the battery emblem when it is charging. The light on the battery emblem will remain on when the battery is fully charged.  Open package of a Monitor strip. Insert battery #1 into black hood on strip and gently squeeze monitor battery onto connection as indicated in instruction booklet. Set aside while preparing skin.  Choose location for your strip, vertical or horizontal, as indicated in the instruction booklet. Shave to remove all hair from location. There cannot be any lotions, oils, powders, or colognes on skin where monitor is to be applied. Wipe skin clean with enclosed Saline wipe. Dry skin completely.  Peel paper labeled #1 off the back of the Monitor strip exposing the adhesive. Place the monitor on the chest in the vertical or horizontal position shown in the instruction booklet. One arrow on the monitor strip must be pointing upward. Carefully remove paper labeled #2, attaching remainder of strip to your skin. Try not to create any folds or wrinkles in the strip as you apply it.  Firmly press and release the circle in the center of the monitor battery. You will hear a small beep. This is turning the monitor battery on. The heart emblem on the monitor battery will light up every 5 seconds if the monitor battery in turned on and connected to the patient  securely. Do not push and hold the circle down as this turns the monitor battery off. The cell phone will locate the monitor battery. A screen will appear on the cell phone checking the connection of your monitor strip. This may read poor connection initially but change to good connection within the next minute. Once your monitor accepts the connection you will hear a series of 3 beeps followed by a climbing crescendo of beeps. A screen will appear on the cell phone showing the two monitor strip placement options. Touch the picture that demonstrates where you applied the monitor strip.  Your monitor strip and battery are  waterproof. You are able to shower, bathe, or swim with the monitor on. They just ask you do not submerge deeper than 3 feet underwater. We recommend removing the monitor if you are swimming in a lake, river, or ocean.  Your monitor battery will need to be switched to a fully charged monitor battery approximately once a week. The cell phone will alert you of an action which needs to be made.  On the cell phone, tap for details to reveal connection status, monitor battery status, and cell phone battery status. The green dots indicates your monitor is in good status. A red dot indicates there is something that needs your attention.  To record a symptom, click the circle on the monitor battery. In 30-60 seconds a list of symptoms will appear on the cell phone. Select your symptom and tap save. Your monitor will record a sustained or significant arrhythmia regardless of you clicking the button. Some patients do not feel the heart rhythm irregularities. [employer] will notify us of any serious or critical events.  Refer to instruction booklet for instructions on switching batteries, changing strips, the Do not disturb or Pause features, or any additional questions.  Call [employer] at [phone], to confirm your monitor is transmitting and record your baseline. They will answer any questions you may have regarding the monitor instructions at that time.  Returning the monitor to [place] all equipment back into blue box. Peel off strip of paper to expose adhesive and close box securely. There is a prepaid UPS shipping label on this box. Drop in a UPS drop box, or at a UPS facility like [employer]. You may also contact [employer] to arrange UPS to pick up monitor package at your home.

## 2021-07-02 NOTE — Assessment & Plan Note (Signed)
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  We will get a repeat cardiac MRI to assess for recurrence.  It was not well visualized on echo.

## 2021-07-02 NOTE — Assessment & Plan Note (Signed)
Symptoms resolved.  She is off colchicine.

## 2021-07-02 NOTE — Progress Notes (Signed)
Cardiology Office Note:    Date:  07/02/2021   ID:  Jill Jenkins, DOB December 28, 1965, MRN [id number]  PCP:  Chesley Noon, MD   [facility] Providers Cardiologist:  Skeet Latch, MD     Referring MD: Chesley Noon, MD   No chief complaint on file.   History of Present Illness:    Jill Jenkins is a 55 y.o. female with a hx of pericardial cyst and ascending aortic aneurysm coming in for follow-up.  She was seen in the hospital 02/15/2021 presenting with severe chest pain and shortness of breath. She was also reported for a syncopal episode lasting 3 minutes associated with tremors of the R hand. 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 TSH was normal. There was concern for acute pericarditis so she was started on colchicine for 3 months and ibuprofen for 1 month. She had the cyst surgically removed via robotic thoracoscopy by Dr. Kipp Brood on 02/20/21. She followed up with Coletta Memos, NP later that month and was doing well. There were no abnormal cells on pathology.  Today, she has been doing good. Since the surgery, she has only had 5-6 "bad days" which have been decreasing. Within the last month, she has been experiencing heart palpitations at least once a week. She notices these episodes primarily during exertion or her hot flashes. The episodes last 10-12 seconds. She also gets chest pain from the upper central chest that diffuses into her L shoulder. Occasionally, she feels discomfort in her R hip which can diffuse to under her L breast. She associates the hip pain with her surgery. The pain bothers her most at night. Recently, she has been receiving treatment for plantar fasciitis in her R foot. She denies any shortness of breath, lightheadedness, headaches, syncope, orthopnea, PND, or lower extremity edema.  Past Medical History:  Diagnosis Date   Insomnia    Low back pain    Menorrhagia 03/28/2012   Migraines    Palpitations  07/02/2021   Renal calculus     Past Surgical History:  Procedure Laterality Date   DILATION AND CURETTAGE OF UTERUS     INTERCOSTAL NERVE BLOCK Right 02/20/2021   Procedure: INTERCOSTAL NERVE BLOCK;  Surgeon: Lajuana Matte, MD;  Location: [facility];  Service: Thoracic;  Laterality: Right;   POLYPECTOMY  03/29/2012   Procedure: POLYPECTOMY;  Surgeon: Thornell Sartorius, MD;  Location: [facility];  Service: Gynecology;  Laterality: N/A;   XI ROBOTIC ASSISTED PERICARDIAL WINDOW Right 02/20/2021   Procedure: XI ROBOTIC ASSISTED THORACOSCOPY PERICARDIAL CYST RESECTION;  Surgeon: Lajuana Matte, MD;  Location: [facility];  Service: Thoracic;  Laterality: Right;    Current Medications: Current Meds  Medication Sig   eletriptan (RELPAX) 40 MG tablet Take 1 tablet (40 mg total) by mouth as needed for migraine or headache (For acute migraine treatment; may repeat x1 PRN >2 hrs; max dose 80mg /24hr).   estradiol (ESTRACE) 0.1 MG/GM vaginal cream Place 1 Applicatorful vaginally 3 (three) times a week. As Needed   topiramate (TOPAMAX) 25 MG tablet Take 1 tablet (25 mg total) by mouth 2 (two) times daily.   topiramate (TOPAMAX) 50 MG tablet Take 1 tablet (50 mg total) by mouth 2 (two) times daily. (Patient taking differently: Take 25 mg by mouth 2 (two) times daily.)   Tretinoin Microsphere 0.06 % GEL Retin-A Micro Pump 0.06 % topical gel  1 APPLICATION THIN LAYER TO FACE NIGHTLY  Allergies:   No known allergies   Social History   Socioeconomic History   Marital status: Married    Spouse name: Not on file   Number of children: Not on file   Years of education: Not on file   Highest education level: Not on file  Occupational History   Not on file  Tobacco Use   Smoking status: Never   Smokeless tobacco: Never  Substance and Sexual Activity   Alcohol use: No   Drug use: No   Sexual activity: Yes    Birth control/protection: Surgical  Other Topics Concern   Not on file  Social History Narrative    Not on file   Social Determinants of Health   Financial Resource Strain: Not on file  Food Insecurity: Not on file  Transportation Needs: Not on file  Physical Activity: Not on file  Stress: Not on file  Social Connections: Not on file     Family History: The patient's family history includes Atrial fibrillation in her father and mother; Hypertension in her father and mother; Stroke in her maternal grandfather.  ROS:   Please see the history of present illness.    (+) Palpitations (+) Chest Pain (+) L Shoulder Pain (+) R hip Pain All other systems reviewed and are negative.  EKGs/Labs/Other Studies Reviewed:    The following studies were reviewed today: Cardiac MRI 02/17/2021 1. There is a 67mm x 40mm x 67mm mass adjacent to the right atrium. Mass is hypointense to myocardium on T1 weighted imaging, hyperintense on T2 weighted imaging, does not suppress with fat saturation, no contrast uptake on first pass perfusion, and no late gadolinium enhancement. This is consistent with a pericardial cyst.   2. Pericardial LGE and hyperenhancement on T2 weighted imaging suggests pericardial inflammation, consistent with acute pericarditis   3. Small pericardial effusion measuring up to 47mm adjacent to LV lateral wall   4. Normal LV size, wall thickness, and systolic function (EF 61%). No LGE to suggest myocardial scar   5.  Normal RV size and systolic function (EF 95%)   6.  Dilated ascending aorta measuring 29mm  Echo 02/16/2021 1. Left ventricular ejection fraction, by estimation, is 60 to 65%. The  left ventricle has normal function. The left ventricle has no regional  wall motion abnormalities. Left ventricular diastolic parameters were  normal.   2. Right ventricular systolic function is normal. The right ventricular  size is normal. There is normal pulmonary artery systolic pressure.   3. The mitral valve is normal in structure. No evidence of mitral valve   regurgitation. No evidence of mitral stenosis.   4. The aortic valve is tricuspid. Aortic valve regurgitation is not  visualized. No aortic stenosis is present.   5. The inferior vena cava is normal in size with greater than 50%  respiratory variability, suggesting right atrial pressure of 3 mmHg.   Lower Venous DVT 02/16/2021 RIGHT:  - There is no evidence of deep vein thrombosis in the lower extremity.  - No cystic structure found in the popliteal fossa.     LEFT:  - There is no evidence of deep vein thrombosis in the lower extremity.  - No cystic structure found in the popliteal fossa.   CTA Chest 02/16/2021 1. No pulmonary embolus. 2. No acute intrathoracic abnormality. 3. 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 4. Multiple fluid density lesions within the liver statistically likely represent hepatic cysts.  EKG:   07/02/2021: No EKG ordered today  Recent Labs: 02/16/2021: Magnesium 1.8; TSH 1.907 02/20/2021: ALT 49 02/21/2021: BUN 11; Creatinine, Ser 0.94; Hemoglobin 11.6; Platelets 200; Potassium 3.7; Sodium 137  Recent Lipid Panel    Component Value Date/Time   CHOL 192 02/16/2021 0200   TRIG 133 02/16/2021 0200   HDL 48 02/16/2021 0200   CHOLHDL 4.0 02/16/2021 0200   VLDL 27 02/16/2021 0200   LDLCALC 117 (H) 02/16/2021 0200     Physical Exam:    VS:  BP 124/90   Pulse 73   Ht 5\' 9"  (1.753 m)   Wt 199 lb 9.6 oz (90.5 kg)   SpO2 99%   BMI 29.48 kg/m  , BMI Body mass index is 29.48 kg/m. GENERAL:  Well appearing HEENT: Pupils equal round and reactive, fundi not visualized, oral mucosa unremarkable NECK:  No jugular venous distention, waveform within normal limits, carotid upstroke brisk and symmetric, no bruits, no thyromegaly LUNGS:  Clear to auscultation bilaterally HEART:  RRR.  PMI not displaced or sustained,S1 and S2 within normal limits, no S3, no S4, no clicks, no rubs, no murmurs ABD:  Flat, positive bowel sounds normal in frequency in pitch,  no bruits, no rebound, no guarding, no midline pulsatile mass, no hepatomegaly, no splenomegaly EXT:  2 plus pulses throughout, no edema, no cyanosis no clubbing SKIN:  No rashes no nodules NEURO:  Cranial nerves II through XII grossly intact, motor grossly intact throughout PSYCH:  Cognitively intact, oriented to person place and time  ASSESSMENT:    1. Palpitations   2. Acute idiopathic pericarditis   3. Pericardial cyst    PLAN:   Palpitations For the last month she has been experiencing palpitations.  Association with hot flashes and exertion.  Episodes last 10-12 seconds.  She has a family history of atrial fibrillation.  Check TSH, CBC, CMP and magnesium.  Check a 30 day [facility] monitor.   Acute idiopathic pericarditis Symptoms resolved.  She is off colchicine.    Pericardial cyst Pericardial cyst removed 02/2021.  We will get a repeat cardiac MRI to assess for recurrence.  It was not well visualized on echo.     Medication Adjustments/Labs and Tests Ordered: Current medicines are reviewed at length with the patient today.  Concerns regarding medicines are outlined above.  Orders Placed This Encounter  Procedures   MR CARDIAC MORPHOLOGY W WO CONTRAST   CBC with Differential/Platelet   T4, free   TSH   Magnesium   Comprehensive metabolic panel   CARDIAC EVENT MONITOR    No orders of the defined types were placed in this encounter.   Patient Instructions  Medication Instructions:  Your physician recommends that you continue on your current medications as directed. Please refer to the Current Medication list given to you today.   *If you need a refill on your cardiac medications before your next appointment, please call your pharmacy*  Lab Work: CBC/CMET/TSH/FT4/MAGNESIUM TODAY   If you have labs (blood work) drawn today and your tests are completely normal, you will receive your results only by: Maili (if you have MyChart) OR A paper copy in the mail If  you have any lab test that is abnormal or we need to change your treatment, we will call you to review the results.  Testing/Procedures: Your physician has recommended that you wear an event monitor. Event monitors are medical devices that record the heart's electrical activity. Doctors most often [place] these monitors to diagnose arrhythmias. Arrhythmias are problems with the speed or rhythm  of the heartbeat. The monitor is a small, portable device. You can wear one while you do your normal daily activities. This is usually used to diagnose what is causing palpitations/syncope (passing out). THIS WILL BE MAILED TO YOU   Your physician has requested that you have a cardiac MRI. Cardiac MRI uses a computer to create images of your heart as its beating, producing both still and moving pictures of your heart and major blood vessels. For further information please visit [email or web address] Please follow the instruction sheet given to you today for more information.  Follow-Up: At [facility], you and your health needs are our priority.  As part of our continuing mission to provide you with exceptional heart care, we have created designated Provider Care Teams.  These Care Teams include your primary Cardiologist (physician) and Advanced Practice Providers (APPs -  Physician Assistants and Nurse Practitioners) who all work together to provide you with the care you need, when you need it.  We recommend signing up for the patient portal called "MyChart".  Sign up information is provided on this After Visit Summary.  MyChart is used to connect with patients for Virtual Visits (Telemedicine).  Patients are able to view lab/test results, encounter notes, upcoming appointments, etc.  Non-urgent messages can be sent to your provider as well.   To learn more about what you can do with MyChart, go to [email or web address].    Your next appointment:   3 month(s)  The format for your next appointment:   In  Person  Provider:   Skeet Latch, MD  Other Instructions ? [facility] [place], [place] [address] [phone]  Please take advantage of the free valet parking available at the MAIN entrance (A entrance). Proceed to the [facility] Radiology Department (First Floor). ? Magnetic resonance imaging (MRI) is a painless test that produces images of the inside of the body without using Xrays.  During an MRI, strong magnets and radio waves work together in a Research officer, political party to form detailed images.   MRI images may provide more details about a medical condition than X-rays, CT scans, and ultrasounds can provide.  You may be given earphones to listen for instructions.  You may eat a light breakfast and take medications as ordered with the exception of HCTZ (fluid pill, other). Please avoid stimulants for 12 hr prior to test. (Ie. Caffeine, nicotine, chocolate, or antihistamine medications)  If a contrast material will be used, an IV will be inserted into one of your veins. Contrast material will be injected into your IV. It will leave your body through your urine within a day. You may be told to drink plenty of fluids to help flush the contrast material out of your system.  You will be asked to remove all metal, including: Watch, jewelry, and other metal objects including hearing aids, hair pieces and dentures. Also wearable glucose monitoring systems (ie. Freestyle Libre and Omnipods) (Braces and fillings normally are not a problem.)   TEST WILL TAKE APPROXIMATELY 1 HOUR  PLEASE NOTIFY SCHEDULING AT LEAST 24 HOURS IN ADVANCE IF YOU ARE UNABLE TO KEEP YOUR APPOINTMENT. [phone]  Please call Marchia Bond, cardiac imaging nurse navigator with any questions/concerns. Marchia Bond RN Navigator Cardiac Imaging Gordy Clement RN Navigator Cardiac Imaging [facility] [phone] Office   [employer] Cardiac Event Monitor  Instructions Your physician has requested you wear your cardiac event monitor for __30__ days, (1-30). [employer] may call or text to  confirm a shipping address. The monitor will be sent to a land address via UPS. [employer] will not ship a monitor to a PO BOX. It typically takes 3-5 days to receive your monitor after it has been enrolled. [employer] will assist with USPS tracking if your package is delayed. The telephone number for [employer] is [phone]. Once you have received your monitor, please review the enclosed instructions. Instruction tutorials can also be viewed under help and settings on the enclosed cell phone. Your monitor has already been registered assigning a specific monitor serial # to you.  Applying the monitor Remove cell phone from case and turn it on. The cell phone works as Dealer and needs to be within [employer] of you at all times. The cell phone will need to be charged on a daily basis. We recommend you plug the cell phone into the enclosed charger at your bedside table every night.  Monitor batteries: You will receive two monitor batteries labelled #1 and #2. These are your recorders. Plug battery #2 onto the second connection on the enclosed charger. Keep one battery on the charger at all times. This will keep the monitor battery deactivated. It will also keep it fully charged for when you need to switch your monitor batteries. A small light will be blinking on the battery emblem when it is charging. The light on the battery emblem will remain on when the battery is fully charged.  Open package of a Monitor strip. Insert battery #1 into black hood on strip and gently squeeze monitor battery onto connection as indicated in instruction booklet. Set aside while preparing skin.  Choose location for your strip, vertical or horizontal, as indicated in the instruction booklet. Shave to remove all hair from location. There cannot be any lotions, oils,  powders, or colognes on skin where monitor is to be applied. Wipe skin clean with enclosed Saline wipe. Dry skin completely.  Peel paper labeled #1 off the back of the Monitor strip exposing the adhesive. Place the monitor on the chest in the vertical or horizontal position shown in the instruction booklet. One arrow on the monitor strip must be pointing upward. Carefully remove paper labeled #2, attaching remainder of strip to your skin. Try not to create any folds or wrinkles in the strip as you apply it.  Firmly press and release the circle in the center of the monitor battery. You will hear a small beep. This is turning the monitor battery on. The heart emblem on the monitor battery will light up every 5 seconds if the monitor battery in turned on and connected to the patient securely. Do not push and hold the circle down as this turns the monitor battery off. The cell phone will locate the monitor battery. A screen will appear on the cell phone checking the connection of your monitor strip. This may read poor connection initially but change to good connection within the next minute. Once your monitor accepts the connection you will hear a series of 3 beeps followed by a climbing crescendo of beeps. A screen will appear on the cell phone showing the two monitor strip placement options. Touch the picture that demonstrates where you applied the monitor strip.  Your monitor strip and battery are waterproof. You are able to shower, bathe, or swim with the monitor on. They just ask you do not submerge deeper than 3 feet underwater. We recommend removing the monitor if you are swimming in a lake, river, or ocean.  Your  monitor battery will need to be switched to a fully charged monitor battery approximately once a week. The cell phone will alert you of an action which needs to be made.  On the cell phone, tap for details to reveal connection status, monitor battery status, and cell phone  battery status. The green dots indicates your monitor is in good status. A red dot indicates there is something that needs your attention.  To record a symptom, click the circle on the monitor battery. In 30-60 seconds a list of symptoms will appear on the cell phone. Select your symptom and tap save. Your monitor will record a sustained or significant arrhythmia regardless of you clicking the button. Some patients do not feel the heart rhythm irregularities. [employer] will notify us of any serious or critical events.  Refer to instruction booklet for instructions on switching batteries, changing strips, the Do not disturb or Pause features, or any additional questions.  Call [employer] at [phone], to confirm your monitor is transmitting and record your baseline. They will answer any questions you may have regarding the monitor instructions at that time.  Returning the monitor to [place] all equipment back into blue box. Peel off strip of paper to expose adhesive and close box securely. There is a prepaid UPS shipping label on this box. Drop in a UPS drop box, or at a UPS facility like [employer]. You may also contact [employer] to arrange UPS to pick up monitor package at your home.   Disposition: FU with Borghild Thaker C. Oval Linsey, MD, [facility] in 3 months   I,Mykaella Javier,acting as a scribe for Skeet Latch, MD.,have documented all relevant documentation on the behalf of Skeet Latch, MD,as directed by  Skeet Latch, MD while in the presence of Skeet Latch, MD.  I, Bruno Oval Linsey, MD have reviewed all documentation for this visit.  The documentation of the exam, diagnosis, procedures, and orders on 07/02/2021 are all accurate and complete.    Signed, Skeet Latch, MD  07/02/2021 7:17 PM    [facility]

## 2021-07-02 NOTE — Assessment & Plan Note (Signed)
For the last month she has been experiencing palpitations.  Association with hot flashes and exertion.  Episodes last 10-12 seconds.  She has a family history of atrial fibrillation.  Check TSH, CBC, CMP and magnesium.  Check a 30 day [facility] monitor.

## 2021-07-03 LAB — COMPREHENSIVE METABOLIC PANEL
ALT: 13 IU/L (ref 0–32)
AST: 14 IU/L (ref 0–40)
Albumin/Globulin Ratio: 2 (ref 1.2–2.2)
Albumin: 4.4 g/dL (ref 3.8–4.9)
Alkaline Phosphatase: 57 IU/L (ref 44–121)
BUN/Creatinine Ratio: 19 (ref 9–23)
BUN: 19 mg/dL (ref 6–24)
Bilirubin Total: 0.3 mg/dL (ref 0.0–1.2)
CO2: 18 mmol/L — ABNORMAL LOW (ref 20–29)
Calcium: 9.3 mg/dL (ref 8.7–10.2)
Chloride: 108 mmol/L — ABNORMAL HIGH (ref 96–106)
Creatinine, Ser: 0.98 mg/dL (ref 0.57–1.00)
Globulin, Total: 2.2 g/dL (ref 1.5–4.5)
Glucose: 83 mg/dL (ref 70–99)
Potassium: 3.8 mmol/L (ref 3.5–5.2)
Sodium: 141 mmol/L (ref 134–144)
Total Protein: 6.6 g/dL (ref 6.0–8.5)
eGFR: 68 mL/min/{1.73_m2} (ref >59)

## 2021-07-03 LAB — CBC WITH DIFFERENTIAL/PLATELET
Basophils Absolute: 0.1 10*3/uL (ref 0.0–0.2)
Basos: 1 %
EOS (ABSOLUTE): 0.1 10*3/uL (ref 0.0–0.4)
Eos: 2 %
Hematocrit: 47.2 % — ABNORMAL HIGH (ref 34.0–46.6)
Hemoglobin: 15.8 g/dL (ref 11.1–15.9)
Immature Grans (Abs): 0 10*3/uL (ref 0.0–0.1)
Immature Granulocytes: 0 %
Lymphocytes Absolute: 3.1 10*3/uL (ref 0.7–3.1)
Lymphs: 35 %
MCH: 31 pg (ref 26.6–33.0)
MCHC: 33.5 g/dL (ref 31.5–35.7)
MCV: 93 fL (ref 79–97)
Monocytes Absolute: 0.7 10*3/uL (ref 0.1–0.9)
Monocytes: 8 %
Neutrophils Absolute: 4.8 10*3/uL (ref 1.4–7.0)
Neutrophils: 54 %
Platelets: 210 10*3/uL (ref 150–450)
RBC: 5.09 x10E6/uL (ref 3.77–5.28)
RDW: 13.2 % (ref 11.7–15.4)
WBC: 8.9 10*3/uL (ref 3.4–10.8)

## 2021-07-03 LAB — MAGNESIUM: Magnesium: 2.2 mg/dL (ref 1.6–2.3)

## 2021-07-03 LAB — TSH: TSH: 1.01 u[IU]/mL (ref 0.450–4.500)

## 2021-07-03 LAB — T4, FREE: Free T4: 1.19 ng/dL (ref 0.82–1.77)

## 2021-07-09 ENCOUNTER — Ambulatory Visit (INDEPENDENT_AMBULATORY_CARE_PROVIDER_SITE_OTHER): Payer: BC Managed Care – PPO

## 2021-07-09 DIAGNOSIS — R002 Palpitations: Secondary | ICD-10-CM

## 2021-07-17 ENCOUNTER — Ambulatory Visit: Payer: BC Managed Care – PPO | Admitting: Adult Health

## 2021-07-29 ENCOUNTER — Other Ambulatory Visit: Payer: Self-pay | Admitting: *Deleted

## 2021-07-29 MED ORDER — TOPIRAMATE 25 MG PO TABS: 25.0000 mg | ORAL_TABLET | Freq: Two times a day (BID) | ORAL | 2 refills | Status: DC

## 2021-08-01 ENCOUNTER — Telehealth (HOSPITAL_COMMUNITY): Payer: Self-pay | Admitting: Emergency Medicine

## 2021-08-01 NOTE — Telephone Encounter (Signed)
Reaching out to patient to offer assistance regarding upcoming cardiac imaging study; pt verbalizes understanding of appt date/time, parking situation and where to check in, and verified current allergies; name and call back number provided for further questions should they arise Marchia Bond RN Navigator Cardiac Imaging [place] [facility] [phone] office [phone] cell  Pt reports very bad veins-trouble getting IV in past Denies metal implants Denies claustro

## 2021-08-01 NOTE — Telephone Encounter (Signed)
Attempted to call patient regarding upcoming cardiac MR appointment. Left message on voicemail with name and callback number Makell Drohan RN Navigator Cardiac Imaging [facility] [phone] Office [phone] Cell  

## 2021-08-04 ENCOUNTER — Other Ambulatory Visit: Payer: Self-pay

## 2021-08-04 ENCOUNTER — Ambulatory Visit (HOSPITAL_COMMUNITY)
Admission: RE | Admit: 2021-08-04 | Discharge: 2021-08-04 | Disposition: A | Discharge status: Home / Self Care | Payer: BC Managed Care – PPO | Source: Ambulatory Visit | Attending: Cardiovascular Disease | Admitting: Cardiovascular Disease

## 2021-08-04 DIAGNOSIS — Q248 Other specified congenital malformations of heart: Secondary | ICD-10-CM | POA: Insufficient documentation

## 2021-08-04 DIAGNOSIS — R002 Palpitations: Secondary | ICD-10-CM | POA: Insufficient documentation

## 2021-08-04 DIAGNOSIS — I300 Acute nonspecific idiopathic pericarditis: Secondary | ICD-10-CM

## 2021-08-04 MED ORDER — GADOBUTROL 1 MMOL/ML IV SOLN
10.0000 mL | Freq: Once | INTRAVENOUS | Status: AC | PRN
  Administered 2021-08-04: 10 mL via INTRAVENOUS

## 2021-08-12 ENCOUNTER — Telehealth: Payer: Self-pay | Admitting: Cardiovascular Disease

## 2021-08-12 NOTE — Telephone Encounter (Signed)
Jill Jenkins is calling requesting her MRI results.

## 2021-08-12 NOTE — Telephone Encounter (Signed)
Called pt. To give update on MRI results. Does not appear it has been read yet. Pt. Unable to answer phone so VM was left stating that RN would forward concern to Dr. Oval Linsey and her RN to see if we can get the test read. Will return pts. Call when results are in!

## 2021-08-13 ENCOUNTER — Other Ambulatory Visit: Payer: Self-pay | Admitting: Orthopaedic Surgery

## 2021-08-13 DIAGNOSIS — M25571 Pain in right ankle and joints of right foot: Secondary | ICD-10-CM

## 2021-08-15 NOTE — Telephone Encounter (Signed)
Results called to pt. Pt. Verbalizes understanding!

## 2021-08-24 ENCOUNTER — Other Ambulatory Visit: Payer: BC Managed Care – PPO

## 2021-09-05 ENCOUNTER — Other Ambulatory Visit: Payer: Self-pay

## 2021-09-05 ENCOUNTER — Ambulatory Visit
Admission: RE | Admit: 2021-09-05 | Discharge: 2021-09-05 | Disposition: A | Discharge status: Home / Self Care | Payer: BC Managed Care – PPO | Source: Ambulatory Visit | Attending: Orthopaedic Surgery | Admitting: Orthopaedic Surgery

## 2021-09-05 DIAGNOSIS — M25571 Pain in right ankle and joints of right foot: Secondary | ICD-10-CM

## 2021-09-18 ENCOUNTER — Ambulatory Visit (INDEPENDENT_AMBULATORY_CARE_PROVIDER_SITE_OTHER): Payer: BC Managed Care – PPO | Admitting: Adult Health

## 2021-09-18 ENCOUNTER — Encounter: Payer: Self-pay | Admitting: Adult Health

## 2021-09-18 VITALS — BP 116/76 | HR 76 | Ht 69.0 in | Wt 202.4 lb

## 2021-09-18 DIAGNOSIS — G43009 Migraine without aura, not intractable, without status migrainosus: Secondary | ICD-10-CM

## 2021-09-18 DIAGNOSIS — G43709 Chronic migraine without aura, not intractable, without status migrainosus: Secondary | ICD-10-CM | POA: Diagnosis not present

## 2021-09-18 MED ORDER — ELETRIPTAN HYDROBROMIDE 40 MG PO TABS: 40.0000 mg | ORAL_TABLET | ORAL | 11 refills | Status: DC | PRN

## 2021-09-18 MED ORDER — TOPIRAMATE 50 MG PO TABS: 50.0000 mg | ORAL_TABLET | Freq: Two times a day (BID) | ORAL | 3 refills | Status: DC

## 2021-09-18 MED ORDER — TOPIRAMATE 25 MG PO TABS: 25.0000 mg | ORAL_TABLET | Freq: Two times a day (BID) | ORAL | 3 refills | Status: DC

## 2021-09-18 NOTE — Progress Notes (Signed)
[facility] [address] [place]. [place] [address] [phone]       FOLLOW UP NOTE  Ms. 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 Leonie Man   Chief Complaint  Patient presents with   Migraine    Rm 2,  6 month FU  "migraines susceptible to weather"      HPI:   Update 09/18/2020 JM: Mrs Marilynne Halsted returns today for a migraine follow-up. Her Topamax was increased in August to 75mg  BID after a flare-up and she is seeing some improvement, although does experience some brain fog which has been ongoing and slightly increased after dosage increase. She plans on scheudling f/u with ophthalmology due to concerns of worsening far away vision - does have hx of Lasix procedure >15 yrs ago. She remains on Eletriptan with benefit without side effects.  She reports her HA are dependent on weather and changes in barometric pressure, but occur a couple times a month. They occur behind her right eye and described as throbbing. No changes to headaches. Denies aura. BP today 116/76. 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  No further concerns at this time    History provided for reference purposes only Update 01/09/2021 JM: Mrs. Gindlesperger returns for 91-month migraine follow-up  Reports about 2-3 migraine days per month but generally varies as her migraines are usually triggered by change in bariatric pressure or climate changes such as if she travels to [place] or [place] to see her family.  If she stays in this area without any traveling, she may experience 1 migraine per month Remains on topiramate 50 mg twice daily tolerating without side effects She remains on Relpax with continued benefit  She does report new onset allergies this season which she has not previously experienced.  She is also currently following with her OB/GYN regarding occasional menopausal  symptoms and questions possible treatment options that would not interact with topiramate.  No further concerns at this time  Update 07/09/2020 JM: Ms. Kady returns for migraine follow-up. She called office on 8/9 with complaints of worsening migraine headaches and recommended increasing Topamax dosage back to 50 mg twice daily with improvement of migraine occurrence.  She remains on Topamax 50 mg twice daily without side effects and currently, migraine frequency can vary with some months only a couple migraines but other months may be up to 5 or 6.  She will use Relpax at migraine onset with occasional benefit but will typically experience an additional migraine the following day around the same time.  She has noticed triggers or change of barometric pressure, alcohol and sweet foods such as cake.  No concerns at this time.  Update 01/08/2020 JM: Ms. Parlin returns for migraine follow-up.  Initially presented to our office in 05/2017 after recurrent stereotypical episodes of tightening of extremities followed by brief loss of consciousness and headaches of unclear significance potentially atypical migraine versus complex partial seizures which responded well to Topamax 100 mg twice daily.  Longstanding history of migraines. On 11/07/2018, seen in office by Dr. Leonie Man for new complaints of dizziness, tremors, twitching and memory loss potentially medication related as all lab work and imaging unremarkable.  Recommended decreasing dosage of Topamax but at follow-up on 02/23/2019 she denies dosage decrease and spontaneous resolution of prior symptoms.  As migraine stable, it was recommended to decrease Topamax 50 mg twice daily to  25 mg twice daily.  Initially had difficulty with worsening migraine frequency but eventually subsided and has continued on Topamax 25 mg twice daily.  Typical migraine occurrence once monthly lasting for approximately 2 to 3 days and has had slight increase more recently with new onset of  allergies this season.  Use of Relpax for emergent relief with benefit approximately 1 time monthly.  No concerns at this time.  Update 02/23/2019 JM: She is being seen today for follow-up regarding complaints of dizziness, tremors, memory twitching and mild memory impairment.  Extensive lab work-up unremarkable and repeat MRI without acute or new findings compared to prior MRI.  It was recommended at prior visit to decrease Topamax dose from 50 mg twice daily to 25 mg twice daily as migraines have been stable.  She did not end up decreasing Topamax dose as she had a lot of 50 mg capsules still and wanted to finish those prior to decreasing dose.  All prior symptoms have subsided without intervention.  Migraines have been stable and will experience occasional migraine with weather changes.  No further concerns at this time.  11/07/2018 visit Dr. Leonie Man: Ms. Clare is referred today for evaluation for new complaints of dizziness, tremors, twitching's and memory loss for the last 3 to 4 months.  History is obtained from the patient and review of electronic medical records.  I have personally reviewed imaging films in PACS.  She is known to [place] for prior visits for a typical migraine and was last seen on 08/24/2018 by Venancio Poisson nurse practitioner.  States her migraine headaches seem to be under good control and presently she takes Topamax 50 mg twice daily and gets headaches only once every 2 3 months or so.  Her main complaint today is that for the last 4 to 6 months she has had some progressively worsening symptoms.  She feels dizzy off balance.  She does yoga 4 days a week and at times noticed that she has to hold on to avoid falls.  She has had a few minor falls but no major injuries.  She denies true vertigo or presyncopal symptoms or loss of consciousness.  She is also noticed a mild tremor of her right hand off-and-on.  It is very fine and intermittent and does not interfere with activities of daily  living.  She also feels that her body is quite rigid and at times she is grinding her teeth.  She is also noticed occasional twitching's of the muscles involving her shoulder and the neck.  She also complains of mild short-term memory difficulties.  She denies any prior history of depression or anxiety but feels that she has a underlying fear that she may develop Parkinson's disease as her mother suffered from this disease.  She has lost about 50 pounds in the last 1 year on a strict keto diet and she states she is eating healthy.  She denies any significant headaches, loss of consciousness, double vision, gait or balance difficulties.  She has had no recent lab work or brain imaging study done.      ROS:   14 system review of systems is positive for allergies and all other systems negative  PMH:  Past Medical History:  Diagnosis Date   Insomnia    Low back pain    Menorrhagia 03/28/2012   Migraines    Palpitations 07/02/2021   Renal calculus     Social History:  Social History   Socioeconomic History   Marital status:  Married    Spouse name: Not on file   Number of children: Not on file   Years of education: Not on file   Highest education level: Not on file  Occupational History   Not on file  Tobacco Use   Smoking status: Never   Smokeless tobacco: Never  Substance and Sexual Activity   Alcohol use: No   Drug use: No   Sexual activity: Yes    Birth control/protection: Surgical  Other Topics Concern   Not on file  Social History Narrative   Not on file   Social Determinants of Health   Financial Resource Strain: Not on file  Food Insecurity: Not on file  Transportation Needs: Not on file  Physical Activity: Not on file  Stress: Not on file  Social Connections: Not on file  Intimate Partner Violence: Not on file    Medications:   Current Outpatient Medications on File Prior to Visit  Medication Sig Dispense Refill   eletriptan (RELPAX) 40 MG tablet Take 1  tablet (40 mg total) by mouth as needed for migraine or headache (For acute migraine treatment; may repeat x1 PRN >2 hrs; max dose 80mg /24hr). 10 tablet 5   estradiol (ESTRACE) 0.1 MG/GM vaginal cream Place 1 Applicatorful vaginally 3 (three) times a week. As Needed     topiramate (TOPAMAX) 25 MG tablet Take 1 tablet (25 mg total) by mouth 2 (two) times daily. 60 tablet 2   topiramate (TOPAMAX) 50 MG tablet Take 1 tablet (50 mg total) by mouth 2 (two) times daily. (Patient taking differently: Take 25 mg by mouth 2 (two) times daily.) 180 tablet 3   Tretinoin Microsphere 0.06 % GEL Retin-A Micro Pump 0.06 % topical gel  1 APPLICATION THIN LAYER TO FACE NIGHTLY     meloxicam (MOBIC) 15 MG tablet Take 15 mg by mouth daily. (Patient not taking: Reported on 09/18/2021)     No current facility-administered medications on file prior to visit.    Allergies:   Allergies  Allergen Reactions   No Known Allergies    Vitals: Today's Vitals   09/18/21 1433  BP: 116/76  Pulse: 76  Weight: 202 lb 6.4 oz (91.8 kg)  Height: 5\' 9"  (1.753 m)    Body mass index is 29.89 kg/m.   Physical exam: General: well developed, well nourished, very pleasant middle-age Caucasian female, seated, in no evident distress Head: head normocephalic and atraumatic.   Neck: supple with no carotid or supraclavicular bruits Cardiovascular: regular rate and rhythm, no murmurs Musculoskeletal: no deformity Skin:  no rash/petichiae Vascular:  Normal pulses all extremities   Neurologic Exam Mental Status: Awake and fully alert.   Fluent speech and language.  Oriented to place and time. Recent and remote memory intact. Attention span, concentration and fund of knowledge appropriate. Mood and affect appropriate.  Cranial Nerves: Pupils equal, briskly reactive to light. Extraocular movements full without nystagmus. Visual fields full to confrontation. Hearing intact. Facial sensation intact. Face, tongue, palate moves normally  and symmetrically.  Motor: Normal bulk and tone. Normal strength in all tested extremity muscles. Sensory.: intact to touch , pinprick , position and vibratory sensation.  Coordination: Rapid alternating movements normal in all extremities. Finger-to-nose and heel-to-shin performed accurately bilaterally. Gait and Station: Arises from chair without difficulty. Stance is normal. Gait demonstrates normal stride length and balance without use of assistive device Reflexes: 1+ and symmetric. Toes downgoing.        ASSESSMENT/PLAN:  LENISE JR is a 56 year old  Caucasian lady referred to the office with initial evaluation on 05/26/2017 with Dr. 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  Longstanding history of migraine headaches since age of 81.  Abnormal MRI showing nonspecific white matter hyperintensities likely migraine related. Worsening of migraine headaches in 04/2021 which improved after steroid taper pack and increase Topamax dosage   -Monthly migraine frequency very depending on bariatric pressure or climate change but overall relatively stable occurring 1-2 days/month -Continue Topamax 75 mg twice daily for migraine prophylaxis -refill provided - SE of brain fog - discussed decreasing dosage but as headaches improved after dosage increase, she wishes to continue current dose -advised to f/u with ophthalmology re: worsening vision as this could potentially be a SE of topiramate -Continue Relpax for emergent relief -refill provided -Provided with Nurtec sample pack to try prophylactically prior to storms - she will call if beneficial   Follow-up in 1 year or call earlier if needed   CC:  Chesley Noon, MD     I spent 29 minutes of face-to-face and non-face-to-face time with patient.  This included  previsit chart review, lab review, study review, order entry, electronic health record documentation, patient education and discussion regarding chronic migraine headaches, ongoing treatment plan and potential side effects as above and answered all other questions to patient satisfaction   Frann Rider, AGNP-BC  [facility] [address] [place], [place] [address]  Phone [phone] Fax [phone] Note: This document was prepared with digital dictation and possible smart phrase technology. Any transcriptional errors that result from this process are unintentional.

## 2021-10-13 ENCOUNTER — Other Ambulatory Visit: Payer: Self-pay

## 2021-10-13 ENCOUNTER — Ambulatory Visit (INDEPENDENT_AMBULATORY_CARE_PROVIDER_SITE_OTHER): Payer: BC Managed Care – PPO | Admitting: Cardiovascular Disease

## 2021-10-13 ENCOUNTER — Encounter (HOSPITAL_BASED_OUTPATIENT_CLINIC_OR_DEPARTMENT_OTHER): Payer: Self-pay | Admitting: Cardiovascular Disease

## 2021-10-13 DIAGNOSIS — I301 Infective pericarditis: Secondary | ICD-10-CM

## 2021-10-13 DIAGNOSIS — I3139 Other pericardial effusion (noninflammatory): Secondary | ICD-10-CM

## 2021-10-13 DIAGNOSIS — I7121 Aneurysm of the ascending aorta, without rupture: Secondary | ICD-10-CM | POA: Diagnosis not present

## 2021-10-13 DIAGNOSIS — Q248 Other specified congenital malformations of heart: Secondary | ICD-10-CM

## 2021-10-13 HISTORY — DX: Aneurysm of the ascending aorta, without rupture: I71.21

## 2021-10-13 NOTE — Assessment & Plan Note (Signed)
Small on cardiac MRI.  She has no symptoms.

## 2021-10-13 NOTE — Assessment & Plan Note (Signed)
Stable on repeat imaging at 4.0 cm.  She does not have HTN.  Repeat cardiac MRI in one year as above.

## 2021-10-13 NOTE — Progress Notes (Signed)
Cardiology Office Note:    Date:  10/13/2021   ID:  Verdia, Bolt [birth year], MRN [id number]  PCP:  Chesley Noon, MD   [facility] Providers Cardiologist:  Skeet Latch, MD     Referring MD: Chesley Noon, MD   No chief complaint on file.   History of Present Illness:    Jill Jenkins is a 56 y.o. female with a hx of pericardial cyst and ascending aortic aneurysm coming in for follow-up.  She was seen in the hospital 02/15/2021 presenting with severe chest pain and shortness of breath. She was also reported for a syncopal episode lasting 3 minutes associated with tremors of the R hand. 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 TSH was normal. There was concern for acute pericarditis so she was started on colchicine for 3 months and ibuprofen for 1 month. She had the cyst surgically removed via robotic thoracoscopy by Dr. Kipp Brood on 02/20/21. She followed up with Coletta Memos, NP later that month and was doing well. There were no abnormal cells on pathology.  At her last visit, she was recovering from her cyst-removal surgery and complained of palpitations. Cardiac MRI 07/2021 showed no recurrent pericardial cyst and her ascending aorta was unchanged at 4.0 cm. 30-day Zio monitor showed sinus rhythm with rare PVCs and PACS.   Today, she is doing well. Recently, she reports pain in her back and plantar fasciitis. She has been taking prednisone and doing yoga to alleviate the pain. She has been recommended surgery but is not interested at this time. From a cardiovascular standpoint, she is doing well. Due to the back and heel pain, she has not been active. She hopes to get back to exercising after her prednisone prescription. She denies any palpitations, chest pain, shortness of breath, lightheadedness, headaches, syncope, orthopnea, PND, lower extremity edema or exertional symptoms.  Past Medical History:  Diagnosis Date    Aneurysm of ascending aorta 10/13/2021   Insomnia    Low back pain    Menorrhagia 03/28/2012   Migraines    Palpitations 07/02/2021   Pericardial effusion    Renal calculus     Past Surgical History:  Procedure Laterality Date   DILATION AND CURETTAGE OF UTERUS     INTERCOSTAL NERVE BLOCK Right 02/20/2021   Procedure: INTERCOSTAL NERVE BLOCK;  Surgeon: Lajuana Matte, MD;  Location: [facility] OR;  Service: Thoracic;  Laterality: Right;   POLYPECTOMY  03/29/2012   Procedure: POLYPECTOMY;  Surgeon: Thornell Sartorius, MD;  Location: [facility];  Service: Gynecology;  Laterality: N/A;   XI ROBOTIC ASSISTED PERICARDIAL WINDOW Right 02/20/2021   Procedure: XI ROBOTIC ASSISTED THORACOSCOPY PERICARDIAL CYST RESECTION;  Surgeon: Lajuana Matte, MD;  Location: [facility] OR;  Service: Thoracic;  Laterality: Right;    Current Medications: Current Meds  Medication Sig   eletriptan (RELPAX) 40 MG tablet Take 1 tablet (40 mg total) by mouth as needed for migraine or headache (For acute migraine treatment; may repeat x1 PRN >2 hrs; max dose 80mg /24hr).   estradiol (ESTRACE) 0.1 MG/GM vaginal cream Place 1 Applicatorful vaginally 3 (three) times a week. As Needed   meloxicam (MOBIC) 15 MG tablet Take 15 mg by mouth daily.   predniSONE (STERAPRED UNI-PAK 21 TAB) 5 MG (21) TBPK tablet Take by mouth as directed.   topiramate (TOPAMAX) 25 MG tablet Take 1 tablet (25 mg total) by mouth 2 (two) times daily.   topiramate (TOPAMAX) 50  MG tablet Take 1 tablet (50 mg total) by mouth 2 (two) times daily.   Tretinoin Microsphere 0.06 % GEL Retin-A Micro Pump 0.06 % topical gel  1 APPLICATION THIN LAYER TO FACE NIGHTLY     Allergies:   No known allergies   Social History   Socioeconomic History   Marital status: Married    Spouse name: Not on file   Number of children: Not on file   Years of education: Not on file   Highest education level: Not on file  Occupational History   Not on file  Tobacco Use   Smoking status:  Never   Smokeless tobacco: Never  Substance and Sexual Activity   Alcohol use: No   Drug use: No   Sexual activity: Yes    Birth control/protection: Surgical  Other Topics Concern   Not on file  Social History Narrative   Not on file   Social Determinants of Health   Financial Resource Strain: Not on file  Food Insecurity: Not on file  Transportation Needs: Not on file  Physical Activity: Not on file  Stress: Not on file  Social Connections: Not on file     Family History: The patient's family history includes Atrial fibrillation in her father and mother; Hypertension in her father and mother; Stroke in her maternal grandfather.  ROS:   Please see the history of present illness.    (+) Back pain (+) Heel pain  All other systems reviewed and are negative.  EKGs/Labs/Other Studies Reviewed:    The following studies were reviewed today: MRI Cardiac 08/04/21 1.  S/p resection of pericardial cyst.  No evidence of recurrence   2. Normal LV size and wall thickness with hyperdynamic systolic function (EF 16%). No LGE to suggest myocardial scar   3.  Normal RV size and systolic function (EF 10%)   4.  Dilated ascending aorta measuring 48mm   5. Small pericardial effusion anterior to RV  Monitor 08/12/21 30-day event Monitor   Quality: Fair.  Baseline artifact. Predominant rhythm: Sinus rhythm Average heart rate: 69 bpm Max heart rate: 132 bpm Min heart rate: 47 bpm Pauses >2.5 seconds: None   Less than 1% PVC Less than 1% PAC  Cardiac MRI 02/17/2021 1. There is a 67mm x 7mm x 33mm mass adjacent to the right atrium. Mass is hypointense to myocardium on T1 weighted imaging, hyperintense on T2 weighted imaging, does not suppress with fat saturation, no contrast uptake on first pass perfusion, and no late gadolinium enhancement. This is consistent with a pericardial cyst.   2. Pericardial LGE and hyperenhancement on T2 weighted imaging suggests pericardial  inflammation, consistent with acute pericarditis   3. Small pericardial effusion measuring up to 40mm adjacent to LV lateral wall   4. Normal LV size, wall thickness, and systolic function (EF 96%). No LGE to suggest myocardial scar   5.  Normal RV size and systolic function (EF 04%)   6.  Dilated ascending aorta measuring 26mm  Echo 02/16/2021 1. Left ventricular ejection fraction, by estimation, is 60 to 65%. The  left ventricle has normal function. The left ventricle has no regional  wall motion abnormalities. Left ventricular diastolic parameters were  normal.   2. Right ventricular systolic function is normal. The right ventricular  size is normal. There is normal pulmonary artery systolic pressure.   3. The mitral valve is normal in structure. No evidence of mitral valve  regurgitation. No evidence of mitral stenosis.   4.  The aortic valve is tricuspid. Aortic valve regurgitation is not  visualized. No aortic stenosis is present.   5. The inferior vena cava is normal in size with greater than 50%  respiratory variability, suggesting right atrial pressure of 3 mmHg.   Lower Venous DVT 02/16/2021 RIGHT:  - There is no evidence of deep vein thrombosis in the lower extremity.  - No cystic structure found in the popliteal fossa.     LEFT:  - There is no evidence of deep vein thrombosis in the lower extremity.  - No cystic structure found in the popliteal fossa.   CTA Chest 02/16/2021 1. No pulmonary embolus. 2. No acute intrathoracic abnormality. 3. 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 4. Multiple fluid density lesions within the liver statistically likely represent hepatic cysts.  EKG:  EKG was not ordered today  Recent Labs: 07/02/2021: ALT 13; BUN 19; Creatinine, Ser 0.98; Hemoglobin 15.8; Magnesium 2.2; Platelets 210; Potassium 3.8; Sodium 141; TSH 1.010  Recent Lipid Panel    Component Value Date/Time   CHOL 192 02/16/2021 0200   TRIG 133 02/16/2021  0200   HDL 48 02/16/2021 0200   CHOLHDL 4.0 02/16/2021 0200   VLDL 27 02/16/2021 0200   LDLCALC 117 (H) 02/16/2021 0200     Physical Exam:    VS:  BP 114/90 (BP Location: Left Arm, Patient Position: Sitting, Cuff Size: Large)    Pulse 66    Ht 5\' 9"  (1.753 m)    Wt 200 lb (90.7 kg)    SpO2 99%    BMI 29.53 kg/m  , BMI Body mass index is 29.53 kg/m. GENERAL:  Well appearing HEENT: Pupils equal round and reactive, fundi not visualized, oral mucosa unremarkable NECK:  No jugular venous distention, waveform within normal limits, carotid upstroke brisk and symmetric, no bruits, no thyromegaly LUNGS:  Clear to auscultation bilaterally HEART:  RRR.  PMI not displaced or sustained,S1 and S2 within normal limits, no S3, no S4, no clicks, no rubs, no murmurs ABD:  Flat, positive bowel sounds normal in frequency in pitch, no bruits, no rebound, no guarding, no midline pulsatile mass, no hepatomegaly, no splenomegaly EXT:  2 plus pulses throughout, no edema, no cyanosis no clubbing SKIN:  No rashes no nodules NEURO:  Cranial nerves II through XII grossly intact, motor grossly intact throughout PSYCH:  Cognitively intact, oriented to person place and time  ASSESSMENT:    1. Pericardial effusion   2. Pericardial cyst   3. Acute viral pericarditis   4. Aneurysm of ascending aorta without rupture     PLAN:   Pericardial effusion Small on cardiac MRI.  She has no symptoms.   Pericardial cyst Surgically removed.  No recurrence on cardiac MRI 07/2021.  Repeat cardiac MRI in one year and follow up after.   SVT (supraventricular tachycardia) (HCC) No recent symptoms.  She hasn't required any medication.  Only PAC/PVC on monitor.  Pericarditis Resolved after pericardial cyst was removed.   Aneurysm of ascending aorta Stable on repeat imaging at 4.0 cm.  She does not have HTN.  Repeat cardiac MRI in one year as above.   Medication Adjustments/Labs and Tests Ordered: Current medicines are  reviewed at length with the patient today.  Concerns regarding medicines are outlined above.  Orders Placed This Encounter  Procedures   MR CARDIAC MORPHOLOGY W WO CONTRAST    No orders of the defined types were placed in this encounter.    There are no Patient Instructions on  file for this visit.   Disposition: FU with Sharada Albornoz C. Oval Linsey, MD, [facility] in 1 year after cardiac MRI  I,Mykaella Javier,acting as a scribe for Skeet Latch, MD.,have documented all relevant documentation on the behalf of Skeet Latch, MD,as directed by  Skeet Latch, MD while in the presence of Skeet Latch, MD.  I, [place] Oval Linsey, MD have reviewed all documentation for this visit.  The documentation of the exam, diagnosis, procedures, and orders on 10/13/2021 are all accurate and complete.   Signed, Skeet Latch, MD  10/13/2021 8:57 AM    [place]

## 2021-10-13 NOTE — Assessment & Plan Note (Signed)
Resolved after pericardial cyst was removed.

## 2021-10-13 NOTE — Patient Instructions (Signed)
Medication Instructions:  Your physician recommends that you continue on your current medications as directed. Please refer to the Current Medication list given to you today.   *If you need a refill on your cardiac medications before your next appointment, please call your pharmacy*  Lab Work: NONE If you have labs (blood work) drawn today and your tests are completely normal, you will receive your results only by: [place] (if you have MyChart) OR A paper copy in the mail If you have any lab test that is abnormal or we need to change your treatment, we will call you to review the results.  Testing/Procedures: Your physician has requested that you have a cardiac MRI. Cardiac MRI uses a computer to create images of your heart as its beating, producing both still and moving pictures of your heart and major blood vessels. For further information please visit [email or web address] Please follow the instruction sheet given to you today for more information. TO BE DONE January 2024  Follow-Up: At [facility], you and your health needs are our priority.  As part of our continuing mission to provide you with exceptional heart care, we have created designated Provider Care Teams.  These Care Teams include your primary Cardiologist (physician) and Advanced Practice Providers (APPs -  Physician Assistants and Nurse Practitioners) who all work together to provide you with the care you need, when you need it.  We recommend signing up for the patient portal called "MyChart".  Sign up information is provided on this After Visit Summary.  MyChart is used to connect with patients for Virtual Visits (Telemedicine).  Patients are able to view lab/test results, encounter notes, upcoming appointments, etc.  Non-urgent messages can be sent to your provider as well.   To learn more about what you can do with MyChart, go to [email or web address].    Your next appointment:   AFTER YOU HAVE YOUR CARDIAC MRI  IN 12 month(s)   The format for your next appointment:   In Person  Provider:   Skeet Latch, MD

## 2021-10-13 NOTE — Assessment & Plan Note (Signed)
Surgically removed.  No recurrence on cardiac MRI 07/2021.  Repeat cardiac MRI in one year and follow up after.

## 2021-10-13 NOTE — Assessment & Plan Note (Signed)
No recent symptoms.  She hasn't required any medication.  Only PAC/PVC on monitor.

## 2021-10-21 ENCOUNTER — Telehealth: Payer: Self-pay | Admitting: Cardiovascular Disease

## 2021-10-21 NOTE — Telephone Encounter (Signed)
°  Pt c/o of Chest Pain: STAT if CP now or developed within 24 hours  1. Are you having CP right now? No  2. Are you experiencing any other symptoms (ex. SOB, nausea, vomiting, sweating)? Some SOB it comes and goes   3. How long have you been experiencing CP? This weekend  4. Is your CP continuous or coming and going? Coming and going   5. Have you taken Nitroglycerin? No  Pt said this weekend she felt CP it was sharp pain that comes and goes. She thinks her pericarditis is coming back, she wanted to know if she needs to see Dr. Oval Linsey again  ?

## 2021-10-21 NOTE — Telephone Encounter (Signed)
-  Pt called reporting c/o off and on chest pain that started this weekend -Pt state symptoms are worse when she lay down or take a deep breath -Pt report symptoms are similar to when she previously had pericarditis -Pt state symptoms were worst last night and longer in duration. She state after reclining upward she was ale to get some relief. -Pt currently report she is experiencing some chest heaviness and feels like the burning sensation of acid reflux is coming.  Based on current symptoms, nurse recommended pt report to ER for further evaluations. Pt verbalized understanding.

## 2021-10-23 ENCOUNTER — Ambulatory Visit (INDEPENDENT_AMBULATORY_CARE_PROVIDER_SITE_OTHER): Payer: BC Managed Care – PPO | Admitting: Cardiovascular Disease

## 2021-10-23 ENCOUNTER — Encounter (HOSPITAL_BASED_OUTPATIENT_CLINIC_OR_DEPARTMENT_OTHER): Payer: Self-pay | Admitting: Cardiovascular Disease

## 2021-10-23 ENCOUNTER — Other Ambulatory Visit: Payer: Self-pay

## 2021-10-23 DIAGNOSIS — Q248 Other specified congenital malformations of heart: Secondary | ICD-10-CM | POA: Diagnosis not present

## 2021-10-23 DIAGNOSIS — I300 Acute nonspecific idiopathic pericarditis: Secondary | ICD-10-CM | POA: Diagnosis not present

## 2021-10-23 MED ORDER — COLCHICINE 0.6 MG PO TABS: 0.6000 mg | ORAL_TABLET | Freq: Every day | ORAL | 0 refills | Status: DC

## 2021-10-23 MED ORDER — IBUPROFEN 800 MG PO TABS: ORAL_TABLET | ORAL | 0 refills | Status: DC

## 2021-10-23 MED ORDER — PANTOPRAZOLE SODIUM 40 MG PO TBEC: 40.0000 mg | DELAYED_RELEASE_TABLET | Freq: Every day | ORAL | 11 refills | Status: DC

## 2021-10-23 NOTE — Progress Notes (Signed)
Cardiology Office Note:    Date:  10/23/2021   ID:  Jill Jenkins, DOB 1965/09/28, MRN [id number]  PCP:  Jill Noon, MD   [facility] Providers Cardiologist:  Skeet Latch, MD     Referring MD: Jill Noon, MD   No chief complaint on file.   History of Present Illness:    Jill Jenkins is a 56 y.o. female with a hx of pericardial cyst and ascending aortic aneurysm coming in for follow-up. She was seen in the hospital 02/15/2021 presenting with severe chest pain and shortness of breath. She was also reported for a syncopal episode lasting 3 minutes associated with tremors of the R hand. 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 TSH was normal. There was concern for acute pericarditis so she was started on colchicine for 3 months and ibuprofen for 1 month. She had the cyst surgically removed via robotic thoracoscopy by Dr. Kipp Brood on 02/20/21. She followed up with Coletta Memos, NP later that month and was doing well. There were no abnormal cells on pathology.  After her cyst-removal surgery she noted palpitations. Cardiac MRI 07/2021 showed no recurrent pericardial cyst and her ascending aorta was unchanged at 4.0 cm. 30-day Zio monitor showed sinus rhythm with rare PVCs and PACS.  At her last appointment, she reported pain in her back and plantar fasciitis. She was taking prednisone and doing yoga to alleviate the pain. She had been recommended surgery but was not interested at the time. From a cardiovascular standpoint, she was doing well. She called the office 10/21/21 reporting intermittent chest pain that worsened with laying down or deep breathing starting last weekend. These symptoms were similar to when she previously had pericarditis.   Today, she has been doing alright. Starting last Friday, she was sitting in a chair and noticed shooting nerve pain across her R abdomen at her surgery site. She was unable to stay seated on that  side and she had associated shortness of breath. Her chest started hurting the day after and she was unsure if the two pains were related. By Sunday, her chest was burning similar to acid reflux and she was burping. The pain worsened when laying down. The symptoms were similar to when she had pericarditis. She took Tylenol, Advil, and Bayer for pain relief. The side pain has resolve but she continues to feel lingering chest pain. Of note, her prednisone dose ended about 4 to 5 days ago. She attends yoga every Monday night and walks at the park if the weather permits. She denies any palpitations, lightheadedness, headaches, syncope, orthopnea, PND, or lower extremity edema.  Past Medical History:  Diagnosis Date   Aneurysm of ascending aorta 10/13/2021   Insomnia    Low back pain    Menorrhagia 03/28/2012   Migraines    Palpitations 07/02/2021   Pericardial effusion    Renal calculus     Past Surgical History:  Procedure Laterality Date   DILATION AND CURETTAGE OF UTERUS     INTERCOSTAL NERVE BLOCK Right 02/20/2021   Procedure: INTERCOSTAL NERVE BLOCK;  Surgeon: Lajuana Matte, MD;  Location: [facility] OR;  Service: Thoracic;  Laterality: Right;   POLYPECTOMY  03/29/2012   Procedure: POLYPECTOMY;  Surgeon: Thornell Sartorius, MD;  Location: [facility];  Service: Gynecology;  Laterality: N/A;   XI ROBOTIC ASSISTED PERICARDIAL WINDOW Right 02/20/2021   Procedure: XI ROBOTIC ASSISTED THORACOSCOPY PERICARDIAL CYST RESECTION;  Surgeon: Lajuana Matte,  MD;  Location: [facility] OR;  Service: Thoracic;  Laterality: Right;    Current Medications: Current Meds  Medication Sig   colchicine 0.6 MG tablet Take 1 tablet (0.6 mg total) by mouth daily.   eletriptan (RELPAX) 40 MG tablet Take 1 tablet (40 mg total) by mouth as needed for migraine or headache (For acute migraine treatment; may repeat x1 PRN >2 hrs; max dose 80mg /24hr).   estradiol (ESTRACE) 0.1 MG/GM vaginal cream Place 1 Applicatorful vaginally 3 (three)  times a week. As Needed   ibuprofen (ADVIL) 800 MG tablet TAKE 1 TABLET THREE TIMES A DAY FOR 7 DAYS THEN TWICE A DAY FOR 2 DAYS THEN ONCE DAILY FOR 2 DAYS   pantoprazole (PROTONIX) 40 MG tablet Take 1 tablet (40 mg total) by mouth daily.   topiramate (TOPAMAX) 25 MG tablet Take 1 tablet (25 mg total) by mouth 2 (two) times daily.   topiramate (TOPAMAX) 50 MG tablet Take 1 tablet (50 mg total) by mouth 2 (two) times daily.   Tretinoin Microsphere 0.06 % GEL Retin-A Micro Pump 0.06 % topical gel  1 APPLICATION THIN LAYER TO FACE NIGHTLY   [DISCONTINUED] meloxicam (MOBIC) 15 MG tablet Take 15 mg by mouth daily.   [DISCONTINUED] predniSONE (STERAPRED UNI-PAK 21 TAB) 5 MG (21) TBPK tablet Take by mouth as directed.     Allergies:   No known allergies   Social History   Socioeconomic History   Marital status: Married    Spouse name: Not on file   Number of children: Not on file   Years of education: Not on file   Highest education level: Not on file  Occupational History   Not on file  Tobacco Use   Smoking status: Never   Smokeless tobacco: Never  Substance and Sexual Activity   Alcohol use: No   Drug use: No   Sexual activity: Yes    Birth control/protection: Surgical  Other Topics Concern   Not on file  Social History Narrative   Not on file   Social Determinants of Health   Financial Resource Strain: Not on file  Food Insecurity: Not on file  Transportation Needs: Not on file  Physical Activity: Not on file  Stress: Not on file  Social Connections: Not on file     Family History: The patient's family history includes Atrial fibrillation in her father and mother; Hypertension in her father and mother; Stroke in her maternal grandfather.  ROS:   Please see the history of present illness.    (+) R abdomen pain (+) Shortness of breath (+) Chest pain (+) Acid reflux All other systems reviewed and are negative.  EKGs/Labs/Other Studies Reviewed:    The following  studies were reviewed today: MRI Cardiac 08/04/21 1.  S/p resection of pericardial cyst.  No evidence of recurrence   2. Normal LV size and wall thickness with hyperdynamic systolic function (EF 77%). No LGE to suggest myocardial scar   3.  Normal RV size and systolic function (EF 82%)   4.  Dilated ascending aorta measuring 82mm   5. Small pericardial effusion anterior to RV  Monitor 08/12/21 30-day event Monitor   Quality: Fair.  Baseline artifact. Predominant rhythm: Sinus rhythm Average heart rate: 69 bpm Max heart rate: 132 bpm Min heart rate: 47 bpm Pauses >2.5 seconds: None   Less than 1% PVC Less than 1% PAC  Cardiac MRI 02/17/2021 1. There is a 53mm x 47mm x 67mm mass adjacent to the right atrium.  Mass is hypointense to myocardium on T1 weighted imaging, hyperintense on T2 weighted imaging, does not suppress with fat saturation, no contrast uptake on first pass perfusion, and no late gadolinium enhancement. This is consistent with a pericardial cyst.   2. Pericardial LGE and hyperenhancement on T2 weighted imaging suggests pericardial inflammation, consistent with acute pericarditis   3. Small pericardial effusion measuring up to 46mm adjacent to LV lateral wall   4. Normal LV size, wall thickness, and systolic function (EF 44%). No LGE to suggest myocardial scar   5.  Normal RV size and systolic function (EF 03%)   6.  Dilated ascending aorta measuring 43mm  Echo 02/16/2021 1. Left ventricular ejection fraction, by estimation, is 60 to 65%. The  left ventricle has normal function. The left ventricle has no regional  wall motion abnormalities. Left ventricular diastolic parameters were  normal.   2. Right ventricular systolic function is normal. The right ventricular  size is normal. There is normal pulmonary artery systolic pressure.   3. The mitral valve is normal in structure. No evidence of mitral valve  regurgitation. No evidence of mitral stenosis.    4. The aortic valve is tricuspid. Aortic valve regurgitation is not  visualized. No aortic stenosis is present.   5. The inferior vena cava is normal in size with greater than 50%  respiratory variability, suggesting right atrial pressure of 3 mmHg.   Lower Venous DVT 02/16/2021 RIGHT:  - There is no evidence of deep vein thrombosis in the lower extremity.  - No cystic structure found in the popliteal fossa.     LEFT:  - There is no evidence of deep vein thrombosis in the lower extremity.  - No cystic structure found in the popliteal fossa.   CTA Chest 02/16/2021 1. No pulmonary embolus. 2. No acute intrathoracic abnormality. 3. 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 4. Multiple fluid density lesions within the liver statistically likely represent hepatic cysts.  EKG:   10/23/21: Sinus rhythm, rate 66 bpm  Recent Labs: 07/02/2021: ALT 13; BUN 19; Creatinine, Ser 0.98; Hemoglobin 15.8; Magnesium 2.2; Platelets 210; Potassium 3.8; Sodium 141; TSH 1.010  Recent Lipid Panel    Component Value Date/Time   CHOL 192 02/16/2021 0200   TRIG 133 02/16/2021 0200   HDL 48 02/16/2021 0200   CHOLHDL 4.0 02/16/2021 0200   VLDL 27 02/16/2021 0200   LDLCALC 117 (H) 02/16/2021 0200     Physical Exam:    VS:  BP 118/88 (BP Location: Left Arm, Patient Position: Sitting, Cuff Size: Large)    Pulse 66    Ht 5\' 9"  (1.753 m)    Wt 208 lb 12.8 oz (94.7 kg)    BMI 30.83 kg/m  , BMI Body mass index is 30.83 kg/m. GENERAL:  Well appearing HEENT: Pupils equal round and reactive, fundi not visualized, oral mucosa unremarkable NECK:  No jugular venous distention, waveform within normal limits, carotid upstroke brisk and symmetric, no bruits, no thyromegaly LUNGS:  Clear to auscultation bilaterally HEART:  RRR.  PMI not displaced or sustained,S1 and S2 within normal limits, no S3, no S4, no clicks, no rubs, no murmurs ABD:  Flat, positive bowel sounds normal in frequency in pitch, no bruits, no  rebound, no guarding, no midline pulsatile mass, no hepatomegaly, no splenomegaly EXT:  2 plus pulses throughout, no edema, no cyanosis no clubbing SKIN:  No rashes no nodules NEURO:  Cranial nerves II through XII grossly intact, motor grossly intact throughout PSYCH:  Cognitively intact, oriented to person place and time  ASSESSMENT:    1. Acute idiopathic pericarditis   2. Pericardial cyst      PLAN:   Acute idiopathic pericarditis She has recurrent pericarditis.  She was recently treated with prednisone and I suspect this may have contributed.  We will check a sed rate today.  Fortunately her symptoms seem to be improving.  We will go ahead and treat her with colchicine 0.6 mg daily for 1 month.  She will start ibuprofen 800 mg 3 times daily for 7 days followed by twice daily for 2 days followed by daily for 2 days.  While she was on the ibuprofen we will also give her pantoprazole 40 mg daily.  Pericardial cyst No recurrence on her MRI 07/2021.    Medication Adjustments/Labs and Tests Ordered: Current medicines are reviewed at length with the patient today.  Concerns regarding medicines are outlined above.  Orders Placed This Encounter  Procedures   Sedimentation rate   EKG 12-Lead    Meds ordered this encounter  Medications   pantoprazole (PROTONIX) 40 MG tablet    Sig: Take 1 tablet (40 mg total) by mouth daily.    Dispense:  30 tablet    Refill:  11   colchicine 0.6 MG tablet    Sig: Take 1 tablet (0.6 mg total) by mouth daily.    Dispense:  30 tablet    Refill:  0   ibuprofen (ADVIL) 800 MG tablet    Sig: TAKE 1 TABLET THREE TIMES A DAY FOR 7 DAYS THEN TWICE A DAY FOR 2 DAYS THEN ONCE DAILY FOR 2 DAYS    Dispense:  30 tablet    Refill:  0     Patient Instructions  Medication Instructions:  START IBUPROFEN 800 MG AS BELOW 1 TABLET THREE TIMES A DAY FOR 1 WEEK THEN 1 TABLET TWICE A DAY FOR 2 DAYS THEN 1 TABLET DAILY FOR 2 DAYS   TAKE PANTOPRAZOLE 40 MG  DAILY WHILE USING IBUPROFEN   STOP MELOXICAM   START COLCHICINE 0.6 MG DAILY FOR 30 DAYS   *If you need a refill on your cardiac medications before your next appointment, please call your pharmacy*  Lab Work: SED RATE TODAY   If you have labs (blood work) drawn today and your tests are completely normal, you will receive your results only by: MyChart Message (if you have MyChart) OR A paper copy in the mail If you have any lab test that is abnormal or we need to change your treatment, we will call you to review the results.  Testing/Procedures: NONE   Follow-Up: At [facility], you and your health needs are our priority.  As part of our continuing mission to provide you with exceptional heart care, we have created designated Provider Care Teams.  These Care Teams include your primary Cardiologist (physician) and Advanced Practice Providers (APPs -  Physician Assistants and Nurse Practitioners) who all work together to provide you with the care you need, when you need it.  We recommend signing up for the patient portal called "MyChart".  Sign up information is provided on this After Visit Summary.  MyChart is used to connect with patients for Virtual Visits (Telemedicine).  Patients are able to view lab/test results, encounter notes, upcoming appointments, etc.  Non-urgent messages can be sent to your provider as well.   To learn more about what you can do with MyChart, go to [email or web address].    Your next appointment:  2-3 month(s)  The format for your next appointment:   In Person  Provider:   Skeet Latch, MD       Disposition: FU with Jill Sieh C. Oval Linsey, MD, [facility] in 2-3 months  I,Mykaella Javier,acting as a scribe for Skeet Latch, MD.,have documented all relevant documentation on the behalf of Skeet Latch, MD,as directed by  Skeet Latch, MD while in the presence of Skeet Latch, MD.  I, Jeddito Oval Linsey, MD have reviewed all  documentation for this visit.  The documentation of the exam, diagnosis, procedures, and orders on 10/23/2021 are all accurate and complete.   Signed, Skeet Latch, MD  10/23/2021 12:30 PM    [place]

## 2021-10-23 NOTE — Patient Instructions (Signed)
Medication Instructions:  START IBUPROFEN 800 MG AS BELOW 1 TABLET THREE TIMES A DAY FOR 1 WEEK THEN 1 TABLET TWICE A DAY FOR 2 DAYS THEN 1 TABLET DAILY FOR 2 DAYS   TAKE PANTOPRAZOLE 40 MG DAILY WHILE USING IBUPROFEN   STOP MELOXICAM   START COLCHICINE 0.6 MG DAILY FOR 30 DAYS   *If you need a refill on your cardiac medications before your next appointment, please call your pharmacy*  Lab Work: SED RATE TODAY   If you have labs (blood work) drawn today and your tests are completely normal, you will receive your results only by: [place] (if you have MyChart) OR A paper copy in the mail If you have any lab test that is abnormal or we need to change your treatment, we will call you to review the results.  Testing/Procedures: NONE   Follow-Up: At [facility], you and your health needs are our priority.  As part of our continuing mission to provide you with exceptional heart care, we have created designated Provider Care Teams.  These Care Teams include your primary Cardiologist (physician) and Advanced Practice Providers (APPs -  Physician Assistants and Nurse Practitioners) who all work together to provide you with the care you need, when you need it.  We recommend signing up for the patient portal called "MyChart".  Sign up information is provided on this After Visit Summary.  MyChart is used to connect with patients for Virtual Visits (Telemedicine).  Patients are able to view lab/test results, encounter notes, upcoming appointments, etc.  Non-urgent messages can be sent to your provider as well.   To learn more about what you can do with MyChart, go to [email or web address].    Your next appointment:   2-3 month(s)  The format for your next appointment:   In Person  Provider:   Skeet Latch, MD

## 2021-10-23 NOTE — Assessment & Plan Note (Signed)
She has recurrent pericarditis.  She was recently treated with prednisone and I suspect this may have contributed.  We will check a sed rate today.  Fortunately her symptoms seem to be improving.  We will go ahead and treat her with colchicine 0.6 mg daily for 1 month.  She will start ibuprofen 800 mg 3 times daily for 7 days followed by twice daily for 2 days followed by daily for 2 days.  While she was on the ibuprofen we will also give her pantoprazole 40 mg daily.

## 2021-10-23 NOTE — Assessment & Plan Note (Signed)
No recurrence on her MRI 07/2021.

## 2021-10-24 LAB — SEDIMENTATION RATE: Sed Rate: 5 mm/hr (ref 0–40)

## 2021-10-29 ENCOUNTER — Telehealth (HOSPITAL_BASED_OUTPATIENT_CLINIC_OR_DEPARTMENT_OTHER): Payer: Self-pay | Admitting: Cardiovascular Disease

## 2021-10-29 NOTE — Telephone Encounter (Addendum)
° °  Name: Jill Jenkins  DOB: 11/08/1965  MRN: [id number]  Primary Cardiologist: Skeet Latch, MD  Chart reviewed as part of pre-operative protocol coverage. Because of [name]'s past medical history and recent cardiac issues, she will require a follow-up visit in order to better assess preoperative cardiovascular risk.  Patient was recently seen by Dr. Oval Linsey 10/23/21 with complaints of chest pain and shortness of breath and was diagnosed with recurrent pericarditis. She was provided a treatment regimen with recommendation to follow-up in 2-3 months. At this time I recommend patient to keep follow-up as directed prior to clearing for surgery as outlined below. Will route to Dr. Oval Linsey as FYI for follow-up appointment in 01/2022.  I added preop FYI to appointment notes. Per office protocol, the provider seeing this patient should forward their finalized clearance decision after their visit to requesting party below. Will route as FYI to requesting provider. Will remove from preop box as separate preop APP input not necessary at this time.  Charlie Pitter, PA-C 10/29/2021, 2:26 PM

## 2021-10-29 NOTE — Telephone Encounter (Signed)
° °  Pre-operative Risk Assessment    Patient Name: Jill Jenkins  DOB: 06-Jan-1966 MRN: [id number]      Request for Surgical Clearance    Procedure:   Bilateral Breast Plant Exchange, Liposuction to abdomen/flanks/inner thighs/upper arms, and Umbilical Hernia Repair  Date of Surgery:  Clearance TBD                                 Surgeon:  Marianna Payment, MD Surgeon's Group or Practice Name:  [facility] Phone number:  [phone] Fax number:  [phone]   Type of Clearance Requested:   - Medical    Type of Anesthesia: IV MAC   Additional requests/questions:   "After reviewing the patient's history, my concerns include: removal of cyst from heart and diagnosed pericarditis." List of medications we currently use that the patient may receive---Peri-operative Anesthetics include: 1% Lidocaine with 1:100k  epinephrine, 0.5% marcaine 1:1 with 0.29m sodium bicarb. Peri-operative IV sedation medications include: Versed, Fentany l, Ketamine, Propofol, Decadron, Zofran. Post-operative medications include: Cephalexin 500 mg, Oxycodone 5 mg, Promethazine 25 mg.  "Are there any medical contraindications to the proposed procedure and/or modifications that should be made to the patients current prescribed medication regimen?"   Signed, TFrancella Solian  10/29/2021, 1:13 PM

## 2021-11-13 NOTE — Telephone Encounter (Addendum)
November 13, 2021 ?Skeet Latch, MD ?to Me   ?  12:54 PM ?Low risk for surgery. ? ?Will route to Dr Ysidro Evert  ? ? ?

## 2021-11-17 ENCOUNTER — Ambulatory Visit (HOSPITAL_BASED_OUTPATIENT_CLINIC_OR_DEPARTMENT_OTHER): Payer: BC Managed Care – PPO | Admitting: Family

## 2022-01-26 ENCOUNTER — Ambulatory Visit (HOSPITAL_COMMUNITY): Payer: BC Managed Care – PPO | Attending: Cardiovascular Disease

## 2022-01-26 DIAGNOSIS — I7121 Aneurysm of the ascending aorta, without rupture: Secondary | ICD-10-CM | POA: Diagnosis not present

## 2022-01-26 LAB — ECHOCARDIOGRAM COMPLETE
Area-P 1/2: 2.22 cm2
S' Lateral: 1.9 cm

## 2022-01-28 ENCOUNTER — Ambulatory Visit (INDEPENDENT_AMBULATORY_CARE_PROVIDER_SITE_OTHER): Payer: BC Managed Care – PPO | Admitting: Cardiovascular Disease

## 2022-01-28 ENCOUNTER — Encounter (HOSPITAL_BASED_OUTPATIENT_CLINIC_OR_DEPARTMENT_OTHER): Payer: Self-pay | Admitting: Cardiovascular Disease

## 2022-01-28 VITALS — BP 110/82 | HR 68 | Ht 69.0 in | Wt 202.7 lb

## 2022-01-28 DIAGNOSIS — I7121 Aneurysm of the ascending aorta, without rupture: Secondary | ICD-10-CM

## 2022-01-28 DIAGNOSIS — Z01812 Encounter for preprocedural laboratory examination: Secondary | ICD-10-CM

## 2022-01-28 NOTE — Assessment & Plan Note (Signed)
Resolved.  She did well on colchicine and ibuprofen. ?

## 2022-01-28 NOTE — Progress Notes (Signed)
?Cardiology Office Note:   ? ?Date:  01/28/2022  ? ?ID:  Jill Jenkins, DOB August 23, 1966, MRN [id number] ? ?PCP:  Chesley Noon, MD ?  ?[facility] Providers ?Cardiologist:  Skeet Latch, MD    ? ?Referring MD: Chesley Noon, MD  ? ?No chief complaint on file. ? ? ?History of Present Illness:   ? ?Jill Jenkins is a 56 y.o. female with a hx of pericardial cyst and ascending aortic aneurysm coming in for follow-up. She was seen in the hospital 02/15/2021 presenting with severe chest pain and shortness of breath. She was also reported for a syncopal episode lasting 3 minutes associated with tremors of the R hand. 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 TSH was normal. There was concern for acute pericarditis so she was started on colchicine for 3 months and ibuprofen for 1 month. She had the cyst surgically removed via robotic thoracoscopy by Dr. Kipp Brood on 02/20/21. She followed up with Coletta Memos, NP later that month and was doing well. There were no abnormal cells on pathology. ? ?After her cyst-removal surgery she noted palpitations. Cardiac MRI 07/2021 showed no recurrent pericardial cyst and her ascending aorta was unchanged at 4.0 cm. 30-day Zio monitor showed sinus rhythm with rare PVCs and PACS.  At her last appointment, she reported pain in her back and plantar fasciitis. She was taking prednisone and doing yoga to alleviate the pain. She had been recommended surgery but was not interested at the time. From a cardiovascular standpoint, she was doing well. She called the office 10/21/21 reporting intermittent chest pain that worsened with laying down or deep breathing starting last weekend. These symptoms were similar to when she previously had pericarditis.  ? ?Today, she has been doing alright. Starting last Friday, she was sitting in a chair and noticed shooting nerve pain across her R abdomen at her surgery site. She was unable to stay seated on  that side and she had associated shortness of breath. Her chest started hurting the day after and she was unsure if the two pains were related. By Sunday, her chest was burning similar to acid reflux and she was burping. The pain worsened when laying down. The symptoms were similar to when she had pericarditis. She took Tylenol, Advil, and Bayer for pain relief. The side pain has resolve but she continues to feel lingering chest pain. Of note, her prednisone dose ended about 4 to 5 days ago. She attends yoga every Monday night and walks at the park if the weather permits. She denies any palpitations, lightheadedness, headaches, syncope, orthopnea, PND, or lower extremity edema. ? ?At her last appointment she was struggling with recurrent pericarditis.  This has been treated with prednisone.  She was started on ibuprofen and prednisone was discontinued. ESR was within normal limits at the time.  She has not had any recurrent chest pain since that time.  She has been struggling emotionally lately.  Her son passed away unexpectedly in 12/22/22.  She has not been able to get back into her exercise routine.  She has difficulty doing yoga and other quiet activities.  She is looking forward to a new home that is being built near [employer].  She has been walking with her puppy and has no exertional chest pain or shortness of breath.  She struggles with sleep because she has hot flashes.  She sees her OB/GYN next week and plans to discuss it  with her.  She denies lower extremity edema, orthopnea, or PND. ? ? ?Past Medical History:  ?Diagnosis Date  ? Aneurysm of ascending aorta ([place]) 10/13/2021  ? Insomnia   ? Low back pain   ? Menorrhagia 03/28/2012  ? Migraines   ? Palpitations 07/02/2021  ? Pericardial effusion   ? Renal calculus   ? ? ?Past Surgical History:  ?Procedure Laterality Date  ? DILATION AND CURETTAGE OF UTERUS    ? INTERCOSTAL NERVE BLOCK Right 02/20/2021  ? Procedure: INTERCOSTAL NERVE BLOCK;  Surgeon: Lajuana Matte, MD;  Location: [facility];  Service: Thoracic;  Laterality: Right;  ? POLYPECTOMY  03/29/2012  ? Procedure: POLYPECTOMY;  Surgeon: Thornell Sartorius, MD;  Location: [facility];  Service: Gynecology;  Laterality: N/A;  ? XI ROBOTIC ASSISTED PERICARDIAL WINDOW Right 02/20/2021  ? Procedure: XI ROBOTIC ASSISTED THORACOSCOPY PERICARDIAL CYST RESECTION;  Surgeon: Lajuana Matte, MD;  Location: [facility];  Service: Thoracic;  Laterality: Right;  ? ? ?Current Medications: ?Current Meds  ?Medication Sig  ? eletriptan (RELPAX) 40 MG tablet Take 1 tablet (40 mg total) by mouth as needed for migraine or headache (For acute migraine treatment; may repeat x1 PRN >2 hrs; max dose 24m/24hr).  ? estradiol (ESTRACE) 0.1 MG/GM vaginal cream Place 1 Applicatorful vaginally as needed. As Needed  ? topiramate (TOPAMAX) 25 MG tablet Take 1 tablet (25 mg total) by mouth 2 (two) times daily.  ? topiramate (TOPAMAX) 50 MG tablet Take 1 tablet (50 mg total) by mouth 2 (two) times daily.  ? Tretinoin Microsphere 0.06 % GEL Retin-A Micro Pump 0.06 % topical gel ? 1 APPLICATION THIN LAYER TO FACE NIGHTLY  ?  ? ?Allergies:   No known allergies  ? ?Social History  ? ?Socioeconomic History  ? Marital status: Married  ?  Spouse name: Not on file  ? Number of children: Not on file  ? Years of education: Not on file  ? Highest education level: Not on file  ?Occupational History  ? Not on file  ?Tobacco Use  ? Smoking status: Never  ? Smokeless tobacco: Never  ?Substance and Sexual Activity  ? Alcohol use: No  ? Drug use: No  ? Sexual activity: Yes  ?  Birth control/protection: Surgical  ?Other Topics Concern  ? Not on file  ?Social History Narrative  ? Not on file  ? ?Social Determinants of Health  ? ?Financial Resource Strain: Not on file  ?Food Insecurity: Not on file  ?Transportation Needs: Not on file  ?Physical Activity: Not on file  ?Stress: Not on file  ?Social Connections: Not on file  ?  ? ?Family History: ?The patient's family history includes  Atrial fibrillation in her father and mother; Hypertension in her father and mother; Stroke in her maternal grandfather. ? ?ROS:   ?Please see the history of present illness.    ?(+) R abdomen pain ?(+) Shortness of breath ?(+) Chest pain ?(+) Acid reflux ?All other systems reviewed and are negative. ? ?EKGs/Labs/Other Studies Reviewed:   ? ?The following studies were reviewed today: ?MRI Cardiac 08/04/21 ?1.  S/p resection of pericardial cyst.  No evidence of recurrence ?  ?2. Normal LV size and wall thickness with hyperdynamic systolic ?function (EF 794%. No LGE to suggest myocardial scar ?  ?3.  Normal RV size and systolic function (EF 649% ?  ?4.  Dilated ascending aorta measuring 456m?  ?5. Small pericardial effusion anterior to RV ? ?Monitor 08/12/21 ?30-day event Monitor ?  ?  Quality: Fair.  Baseline artifact. ?Predominant rhythm: Sinus rhythm ?Average heart rate: 69 bpm ?Max heart rate: 132 bpm ?Min heart rate: 47 bpm ?Pauses >2.5 seconds: None ?  ?Less than 1% PVC ?Less than 1% PAC ? ?Cardiac MRI 02/17/2021 ?1. There is a 72m x 34mx 2449mass adjacent to the right atrium. ?Mass is hypointense to myocardium on T1 weighted imaging, ?hyperintense on T2 weighted imaging, does not suppress with fat ?saturation, no contrast uptake on first pass perfusion, and no late ?gadolinium enhancement. This is consistent with a pericardial cyst. ?  ?2. Pericardial LGE and hyperenhancement on T2 weighted imaging ?suggests pericardial inflammation, consistent with acute ?pericarditis ?  ?3. Small pericardial effusion measuring up to 7mm43mjacent to LV ?lateral wall ?  ?4. Normal LV size, wall thickness, and systolic function (EF 67%)58%No LGE to suggest myocardial scar ?  ?5.  Normal RV size and systolic function (EF 64%)52% ?6.  Dilated ascending aorta measuring 40mm68mEcho 02/16/2021 ?1. Left ventricular ejection fraction, by estimation, is 60 to 65%. The  ?left ventricle has normal function. The left ventricle has no  regional  ?wall motion abnormalities. Left ventricular diastolic parameters were  ?normal.  ? 2. Right ventricular systolic function is normal. The right ventricular  ?size is normal. There is normal pulmonary

## 2022-01-28 NOTE — Patient Instructions (Signed)
Medication Instructions:  ?Your physician recommends that you continue on your current medications as directed. Please refer to the Current Medication list given to you today.  ? ?*If you need a refill on your cardiac medications before your next appointment, please call your pharmacy* ? ?Lab Work: ?BMET PRIOR TO MRI  ? ?If you have labs (blood work) drawn today and your tests are completely normal, you will receive your results only by: ?MyChart Message (if you have MyChart) OR ?A paper copy in the mail ?If you have any lab test that is abnormal or we need to change your treatment, we will call you to review the results. ? ?Testing/Procedures: ?CARDIAC MRI IN November  ? ?Follow-Up: ?At [facility], you and your health needs are our priority.  As part of our continuing mission to provide you with exceptional heart care, we have created designated Provider Care Teams.  These Care Teams include your primary Cardiologist (physician) and Advanced Practice Providers (APPs -  Physician Assistants and Nurse Practitioners) who all work together to provide you with the care you need, when you need it. ? ?We recommend signing up for the patient portal called "MyChart".  Sign up information is provided on this After Visit Summary.  MyChart is used to connect with patients for Virtual Visits (Telemedicine).  Patients are able to view lab/test results, encounter notes, upcoming appointments, etc.  Non-urgent messages can be sent to your provider as well.   ?To learn more about what you can do with MyChart, go to [email or web address].   ? ?Your next appointment:   ?IN November AFTER YOUR CARDIAC MRI ? ? ? ? ? ? ? ?

## 2022-01-28 NOTE — Assessment & Plan Note (Signed)
4.3 cm on her most recent echo.  It was 4.0 cm on her prior MRI 07/2021.  We will repeat the MRI/MRA 07/2022.  Blood pressure and heart rate are low.  Will not start a beta-blocker. ?

## 2022-01-28 NOTE — Assessment & Plan Note (Signed)
This was resected robotically 02/2021.  She has recovered well and has not had any recurrence on her MRI.  Repeat imaging 07/2022.   ?

## 2022-03-06 ENCOUNTER — Telehealth: Payer: Self-pay | Admitting: Adult Health

## 2022-03-06 MED ORDER — ELETRIPTAN HYDROBROMIDE 40 MG PO TABS: 40.0000 mg | ORAL_TABLET | ORAL | 0 refills | Status: DC | PRN

## 2022-03-06 NOTE — Telephone Encounter (Signed)
Pt called [place] the beach and forgot to pack her eletriptan (RELPAX) 40 MG tablet and she is wanting to know if it can be called in to the [facility] [address] [place], [place] [address]  [phone]

## 2022-04-02 ENCOUNTER — Other Ambulatory Visit: Payer: Self-pay | Admitting: Adult Health

## 2022-07-28 ENCOUNTER — Telehealth: Payer: Self-pay | Admitting: Cardiovascular Disease

## 2022-07-28 DIAGNOSIS — Z01812 Encounter for preprocedural laboratory examination: Secondary | ICD-10-CM

## 2022-07-28 NOTE — Telephone Encounter (Signed)
Advised patient when to go for labs, she will go 1/2 for MRI 1/5 Orders placed

## 2022-07-28 NOTE — Telephone Encounter (Signed)
Patient calling to find out how close her lab work needs to be to her MRI 09/18/2022.

## 2022-08-10 ENCOUNTER — Ambulatory Visit (HOSPITAL_BASED_OUTPATIENT_CLINIC_OR_DEPARTMENT_OTHER): Payer: BC Managed Care – PPO | Admitting: Cardiovascular Disease

## 2022-09-15 LAB — BASIC METABOLIC PANEL
BUN/Creatinine Ratio: 14 (ref 9–23)
BUN: 14 mg/dL (ref 6–24)
CO2: 16 mmol/L — ABNORMAL LOW (ref 20–29)
Calcium: 9.9 mg/dL (ref 8.7–10.2)
Chloride: 108 mmol/L — ABNORMAL HIGH (ref 96–106)
Creatinine, Ser: 0.99 mg/dL (ref 0.57–1.00)
Glucose: 97 mg/dL (ref 70–99)
Potassium: 4.4 mmol/L (ref 3.5–5.2)
Sodium: 140 mmol/L (ref 134–144)
eGFR: 67 mL/min/{1.73_m2} (ref >59)

## 2022-09-17 ENCOUNTER — Telehealth (HOSPITAL_COMMUNITY): Payer: Self-pay | Admitting: *Deleted

## 2022-09-17 NOTE — Progress Notes (Signed)
[facility] [address] 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 Leonie Man   Chief Complaint  Patient presents with   Follow-up    Pt in room #3 and alone. Pt here today for f/u on her migraines. Pt has concern for her topiramate.     HPI:   Update 09/21/2022 JM: Patient returns for yearly migraine follow-up.  Reports migraines continue to be well-controlled on topiramate 75 mg twice daily but continues to experience brain fog which has been gradually worsening.  Is unsure if continued worsening is from continue topiramate usage vs menopause vs losing her son back in March.  She questions other treatment options.  She has previously tried amitriptyline many years ago which initially worked well but eventually lost effectiveness.  She experienced 1 migraine in the month of December but can continue to have worsening migraines with barometric pressure changes or with occasional neck pain after sleeping wrong.      History provided for reference purposes only Update 09/18/2020 JM: Jill Jenkins returns today for a migraine follow-up. Her Topamax was increased in August to '75mg'$  BID after a flare-up and she is seeing some improvement, although does experience some brain fog which has been ongoing and slightly increased after dosage increase. She plans on scheudling f/u with ophthalmology due to concerns of worsening far away vision - does have hx of Lasix procedure >15 yrs ago. She remains on Eletriptan with benefit without side effects.  She reports her HA are dependent on weather and changes in barometric pressure, but occur a couple times a month. They occur behind her right eye and described as throbbing. No changes to headaches. Denies aura. BP today 116/76. History of pericardial cyst  removal in June 2022.  No further concerns at this time  Update 01/09/2021 JM: Jill. Jill Jenkins returns for 59-monthmigraine follow-up  Reports about 2-3 migraine days per month but generally varies as her migraines are usually triggered by change in bariatric pressure or climate changes [place].  If she stays in this area without any traveling, she may experience 1 migraine per month Remains on topiramate 50 mg twice daily tolerating without side effects She remains on Relpax with continued benefit  She does report new onset allergies this season which she has not previously experienced.  She is also currently following with her OB/GYN regarding occasional menopausal symptoms and questions possible treatment options that would not interact with topiramate.  No further concerns at this time  Update 07/09/2020 JM: Ms. RLigmanreturns for migraine follow-up. She called office on 8/9 with complaints of worsening migraine headaches and recommended increasing Topamax dosage back to 50 mg twice daily with improvement of migraine occurrence.  She remains on Topamax 50 mg twice daily without side effects and currently, migraine frequency can vary with some months only a couple migraines but other months may be up to 5 or 6.  She will use Relpax at migraine onset with occasional benefit but will typically experience an additional migraine the following day around the same time.  She has noticed triggers or change of barometric pressure, alcohol and sweet foods such as cake.  No concerns at this time.  Update 01/08/2020 JM: Jill Jenkins  returns for migraine follow-up.  Initially presented to our office in 05/2017 after recurrent stereotypical episodes of tightening of extremities followed by brief loss of consciousness and headaches of unclear significance potentially atypical migraine versus complex partial seizures which responded well to Topamax 100 mg twice daily.   Longstanding history of migraines. On 11/07/2018, seen in office by Dr. Leonie Man for new complaints of dizziness, tremors, twitching and memory loss potentially medication related as all lab work and imaging unremarkable.  Recommended decreasing dosage of Topamax but at follow-up on 02/23/2019 she denies dosage decrease and spontaneous resolution of prior symptoms.  As migraine stable, it was recommended to decrease Topamax 50 mg twice daily to 25 mg twice daily.  Initially had difficulty with worsening migraine frequency but eventually subsided and has continued on Topamax 25 mg twice daily.  Typical migraine occurrence once monthly lasting for approximately 2 to 3 days and has had slight increase more recently with new onset of allergies this season.  Use of Relpax for emergent relief with benefit approximately 1 time monthly.  No concerns at this time.  Update 02/23/2019 JM: She is being seen today for follow-up regarding complaints of dizziness, tremors, memory twitching and mild memory impairment.  Extensive lab work-up unremarkable and repeat MRI without acute or new findings compared to prior MRI.  It was recommended at prior visit to decrease Topamax dose from 50 mg twice daily to 25 mg twice daily as migraines have been stable.  She did not end up decreasing Topamax dose as she had a lot of 50 mg capsules still and wanted to finish those prior to decreasing dose.  All prior symptoms have subsided without intervention.  Migraines have been stable and will experience occasional migraine with weather changes.  No further concerns at this time.  11/07/2018 visit Dr. Leonie Man: Jill Jenkins is referred today for evaluation for new complaints of dizziness, tremors, twitching's and memory loss for the last 3 to 4 months.  History is obtained from the patient and review of electronic medical records.  I have personally reviewed imaging films in PACS.  She is known to [place] for prior visits for a typical migraine and was last  seen on 08/24/2018 by Venancio Poisson nurse practitioner.  States her migraine headaches seem to be under good control and presently she takes Topamax 50 mg twice daily and gets headaches only once every 2 3 months or so.  Her main complaint today is that for the last 4 to 6 months she has had some progressively worsening symptoms.  She feels dizzy off balance.  She does yoga 4 days a week and at times noticed that she has to hold on to avoid falls.  She has had a few minor falls but no major injuries.  She denies true vertigo or presyncopal symptoms or loss of consciousness.  She is also noticed a mild tremor of her right hand off-and-on.  It is very fine and intermittent and does not interfere with activities of daily living.  She also feels that her body is quite rigid and at times she is grinding her teeth.  She is also noticed occasional twitching's of the muscles involving her shoulder and the neck.  She also complains of mild short-term memory difficulties.  She denies any prior history of depression or anxiety but feels that she has a underlying fear that she may develop Parkinson's disease as her mother suffered from this disease.  She has lost about 50 pounds in the last  1 year on a strict keto diet and she states she is eating healthy.  She denies any significant headaches, loss of consciousness, double vision, gait or balance difficulties.  She has had no recent lab work or brain imaging study done.      ROS:   14 system review of systems is positive for allergies and all other systems negative  PMH:  Past Medical History:  Diagnosis Date   Aneurysm of ascending aorta ([place]) 10/13/2021   Insomnia    Low back pain    Menorrhagia 03/28/2012   Migraines    Palpitations 07/02/2021   Pericardial effusion    Renal calculus     Social History:  Social History   Socioeconomic History   Marital status: Married    Spouse name: Not on file   Number of children: Not on file   Years of  education: Not on file   Highest education level: Not on file  Occupational History   Not on file  Tobacco Use   Smoking status: Never   Smokeless tobacco: Never  Substance and Sexual Activity   Alcohol use: No   Drug use: No   Sexual activity: Yes    Birth control/protection: Surgical  Other Topics Concern   Not on file  Social History Narrative   Not on file   Social Determinants of Health   Financial Resource Strain: Not on file  Food Insecurity: Not on file  Transportation Needs: Not on file  Physical Activity: Not on file  Stress: Not on file  Social Connections: Not on file  Intimate Partner Violence: Not on file    Medications:   Current Outpatient Medications on File Prior to Visit  Medication Sig Dispense Refill   eletriptan (RELPAX) 40 MG tablet TAKE 1 TABLETBY MOUTH AS NEEDED FOR MIGRAINE OR HEADACHE (FOR ACUTE MIGRAINE TREATMENT MAY REPEAT X1 AS NEEED >2 HRS MAX DOSE '80MG'$ /24HR). 6 tablet 1   estradiol (ESTRACE) 0.1 MG/GM vaginal cream Place 1 Applicatorful vaginally as needed. As Needed     topiramate (TOPAMAX) 25 MG tablet Take 1 tablet (25 mg total) by mouth 2 (two) times daily. 180 tablet 3   topiramate (TOPAMAX) 50 MG tablet Take 1 tablet (50 mg total) by mouth 2 (two) times daily. 180 tablet 3   Tretinoin Microsphere 0.06 % GEL Retin-A Micro Pump 0.06 % topical gel  1 APPLICATION THIN LAYER TO FACE NIGHTLY     No current facility-administered medications on file prior to visit.    Allergies:   Allergies  Allergen Reactions   No Known Allergies    Vitals: Today's Vitals   09/21/22 1320  BP: 116/68  Pulse: 68  Weight: 214 lb 6.4 oz (97.3 kg)  Height: '5\' 9"'$  (1.753 m)   Body mass index is 31.66 kg/m.  Physical exam: General: well developed, well nourished, very pleasant middle-age Caucasian female, seated, in no evident distress Head: head normocephalic and atraumatic.   Neck: supple with no carotid or supraclavicular bruits Cardiovascular:  regular rate and rhythm, no murmurs Musculoskeletal: no deformity Skin:  no rash/petichiae Vascular:  Normal pulses all extremities   Neurologic Exam Mental Status: Awake and fully alert.   Fluent speech and language.  Oriented to place and time. Recent memory impaired and remote memory intact. Attention span, concentration and fund of knowledge appropriate. Mood and affect appropriate.  Cranial Nerves: Pupils equal, briskly reactive to light. Extraocular movements full without nystagmus. Visual fields full to confrontation. Hearing intact. Facial sensation intact.  Face, tongue, palate moves normally and symmetrically.  Motor: Normal bulk and tone. Normal strength in all tested extremity muscles. Sensory.: intact to touch , pinprick , position and vibratory sensation.  Coordination: Rapid alternating movements normal in all extremities. Finger-to-nose and heel-to-shin performed accurately bilaterally. Gait and Station: Arises from chair without difficulty. Stance is normal. Gait demonstrates normal stride length and balance without use of assistive device Reflexes: 1+ and symmetric. Toes downgoing.        ASSESSMENT/PLAN:  Jill Jenkins is a 57 year old Caucasian lady referred to the office with initial evaluation on 05/26/2017 with Dr. 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  Longstanding history of migraine headaches since age of 57.  Abnormal MRI showing nonspecific white matter hyperintensities likely migraine related. Worsening of migraine headaches in 04/2021 which improved after steroid taper pack and increase Topamax dosage.  Unfortunately, she has continued to experience brain fog sensation since initiating topiramate which has gradually worsened despite good control of migraines.   -Preventative: Recommend  starting Emgality monthly injection.  Continued use of topiramate '75mg'$  BID for now, will plan on gradual taper after 30 days of initiating Emgality, taper instructions provided in AVS.  -Rescue: Continue Relpax as needed -Tried/failed: Amitriptyline (ineffective), topiramate (brain fog), sertraline, would not recommend antihypertensives or beta-blockers due to blood pressure and heart rate already on lower side    Follow-up in 3 months or call earlier if needed    CC:  Chesley Noon, MD     I spent 26 minutes of face-to-face and non-face-to-face time with patient.  This included previsit chart review, lab review, study review, order entry, electronic health record documentation, patient education and discussion regarding the above and answered all the questions to patient's satisfaction   Frann Rider, [facility]  [facility] [address] [place], [place] [address]  Phone [phone] Fax [phone] Note: This document was prepared with digital dictation and possible smart phrase technology. Any transcriptional errors that result from this process are unintentional.

## 2022-09-17 NOTE — Telephone Encounter (Signed)
Reaching out to patient to offer assistance regarding upcoming cardiac imaging study; pt verbalizes understanding of appt date/time, parking situation and where to check in, and verified current allergies; name and call back number provided for further questions should they arise  Jill Clement RN Navigator Cardiac Imaging [facility] [phone] office [phone] cell  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

## 2022-09-18 ENCOUNTER — Other Ambulatory Visit (HOSPITAL_BASED_OUTPATIENT_CLINIC_OR_DEPARTMENT_OTHER): Payer: Self-pay | Admitting: Cardiovascular Disease

## 2022-09-18 ENCOUNTER — Ambulatory Visit (HOSPITAL_COMMUNITY)
Admission: RE | Admit: 2022-09-18 | Discharge: 2022-09-18 | Disposition: A | Discharge status: Home / Self Care | Payer: BC Managed Care – PPO | Source: Ambulatory Visit | Attending: Cardiovascular Disease | Admitting: Cardiovascular Disease

## 2022-09-18 DIAGNOSIS — Q248 Other specified congenital malformations of heart: Secondary | ICD-10-CM

## 2022-09-18 DIAGNOSIS — I3139 Other pericardial effusion (noninflammatory): Secondary | ICD-10-CM | POA: Diagnosis present

## 2022-09-18 MED ORDER — GADOBUTROL 1 MMOL/ML IV SOLN
10.0000 mL | Freq: Once | INTRAVENOUS | Status: AC | PRN
  Administered 2022-09-18: 10 mL via INTRAVENOUS

## 2022-09-21 ENCOUNTER — Encounter: Payer: Self-pay | Admitting: Adult Health

## 2022-09-21 ENCOUNTER — Ambulatory Visit (INDEPENDENT_AMBULATORY_CARE_PROVIDER_SITE_OTHER): Payer: BC Managed Care – PPO | Admitting: Adult Health

## 2022-09-21 VITALS — BP 116/68 | HR 68 | Ht 69.0 in | Wt 214.4 lb

## 2022-09-21 DIAGNOSIS — G43709 Chronic migraine without aura, not intractable, without status migrainosus: Secondary | ICD-10-CM | POA: Diagnosis not present

## 2022-09-21 DIAGNOSIS — G43009 Migraine without aura, not intractable, without status migrainosus: Secondary | ICD-10-CM

## 2022-09-21 MED ORDER — EMGALITY 120 MG/ML ~~LOC~~ SOAJ: 120.0000 mg | SUBCUTANEOUS | 11 refills | Status: DC

## 2022-09-21 MED ORDER — ELETRIPTAN HYDROBROMIDE 40 MG PO TABS: ORAL_TABLET | ORAL | 11 refills | Status: DC

## 2022-09-21 NOTE — Patient Instructions (Signed)
Your Plan:   Recommend starting Emgality monthly injection   Continue topamax, will continue for 30 days after initial injection  Can decrease topamax down to '50mg'$  twice daily for 1 week then decrease down to '25mg'$  twice daily for 1 week then discontinue    If we cannot get injection approved, may consider trialing gabapentin or Effexor to further migraine control    Continue Relpax as needed for rescue medication      Follow up in 3 months or call earlier if needed      Thank you for coming to see [place] at [facility]. I hope we have been able to provide you high quality care today.  You may receive a patient satisfaction survey over the next few weeks. We would appreciate your feedback and comments so that we may continue to improve ourselves and the health of our patients.

## 2022-09-25 NOTE — Progress Notes (Signed)
Cardiac MR results and BMP results called to patient who verbalized understanding. Jill Jenkins MHA RN CCM

## 2022-09-28 ENCOUNTER — Telehealth: Payer: Self-pay

## 2022-09-28 NOTE — Telephone Encounter (Signed)
PA submitted via [place]: [id number] N Your information has been sent to [employer], awaiting determination

## 2022-09-30 NOTE — Telephone Encounter (Signed)
We are unable to approve this request for the  following reason(s): ? Coverage is provided for patients who had at least 4 migraine headache days per  month (prior to initiating a migraine-preventive medication). Coverage cannot be  authorized at this time.   Will discuss alternative with NP

## 2022-10-05 NOTE — Telephone Encounter (Signed)
E-Appeal submitted, waiting on determination.

## 2022-10-05 NOTE — Telephone Encounter (Signed)
Can patient be contacted to see if she is able to recall how many headaches days per month she was having prior to starting topiramate? If she had more than 4 migraine days per month (including actual migraine and duration), then I will update note and can hopefully get Emgality covered. Thank you!

## 2022-10-05 NOTE — Telephone Encounter (Signed)
Can patient be contacted to see if she is able to recall how many headaches days per month she was having prior to starting topiramate?  we are trying to get Prior Authorization approved for [facility]

## 2022-10-05 NOTE — Telephone Encounter (Signed)
Notes does not clarify how many headache days pt had prior to starting a therapy. Would you like to try a alternative?

## 2022-10-05 NOTE — Telephone Encounter (Signed)
Called pt and she stated that she would get headaches almost everyday but Migraines would be at least twice a week.

## 2022-10-08 ENCOUNTER — Telehealth: Payer: Self-pay | Admitting: *Deleted

## 2022-10-08 NOTE — Telephone Encounter (Signed)
   Pre-operative Risk Assessment    Patient Name: Jill Jenkins  DOB: 03-Sep-1966 MRN: [id number]      Request for Surgical Clearance    Procedure:   Bilateral breast implant exchange, liposuction to abdomen/flanks/inner thighs/upper arms, and umbilical hernia repair.   Date of Surgery:  Clearance TBD                                 Surgeon:  Dr. Marianna Payment Surgeon's Group or Practice Name:  [facility] Phone number:  [phone] Fax number:  [phone]   Type of Clearance Requested:   - Medical    Type of Anesthesia:  MAC   Additional requests/questions:    Signed, Greer Ee   10/08/2022, 3:00 PM

## 2022-10-08 NOTE — Telephone Encounter (Signed)
Pt has been scheduled for tele pre op 10/13/22 @ 10:20. Med rec and consent are done.

## 2022-10-08 NOTE — Telephone Encounter (Signed)
Pt has been scheduled for tele pre op 10/13/22 @ 10:20. Med rec and consent are done.     Patient Consent for Virtual Visit        Jill Jenkins has provided verbal consent on 10/08/2022 for a virtual visit (video or telephone).   CONSENT FOR VIRTUAL VISIT FOR:  Jill Jenkins  By participating in this virtual visit I agree to the following:  I hereby voluntarily request, consent and authorize [facility] and its employed or contracted physicians, physician assistants, nurse practitioners or other licensed health care professionals (the Practitioner), to provide me with telemedicine health care services (the "Services") as deemed necessary by the treating Practitioner. I acknowledge and consent to receive the Services by the Practitioner via telemedicine. I understand that the telemedicine visit will involve communicating with the Practitioner through live audiovisual communication technology and the disclosure of certain medical information by electronic transmission. I acknowledge that I have been given the opportunity to request an in-person assessment or other available alternative prior to the telemedicine visit and am voluntarily participating in the telemedicine visit.  I understand that I have the right to withhold or withdraw my consent to the use of telemedicine in the course of my care at any time, without affecting my right to future care or treatment, and that the Practitioner or I may terminate the telemedicine visit at any time. I understand that I have the right to inspect all information obtained and/or recorded in the course of the telemedicine visit and may receive copies of available information for a reasonable fee.  I understand that some of the potential risks of receiving the Services via telemedicine include:  Delay or interruption in medical evaluation due to technological equipment failure or disruption; Information transmitted may not be sufficient (e.g. poor  resolution of images) to allow for appropriate medical decision making by the Practitioner; and/or  In rare instances, security protocols could fail, causing a breach of personal health information.  Furthermore, I acknowledge that it is my responsibility to provide information about my medical history, conditions and care that is complete and accurate to the best of my ability. I acknowledge that Practitioner's advice, recommendations, and/or decision may be based on factors not within their control, such as incomplete or inaccurate data provided by me or distortions of diagnostic images or specimens that may result from electronic transmissions. I understand that the practice of medicine is not an exact science and that Practitioner makes no warranties or guarantees regarding treatment outcomes. I acknowledge that a copy of this consent can be made available to me via my patient portal ([facility] [place]), or I can request a printed copy by calling the office of [place].    I understand that my insurance will be billed for this visit.   I have read or had this consent read to me. I understand the contents of this consent, which adequately explains the benefits and risks of the Services being provided via telemedicine.  I have been provided ample opportunity to ask questions regarding this consent and the Services and have had my questions answered to my satisfaction. I give my informed consent for the services to be provided through the use of telemedicine in my medical care

## 2022-10-08 NOTE — Telephone Encounter (Signed)
   Name: Jill Jenkins  DOB: 19-Sep-1965  MRN: [id number]  Primary Cardiologist: Skeet Latch, MD  Chart reviewed as part of pre-operative protocol coverage. Because of Jill Jenkins's past medical history and time since last visit, she will require a follow-up telephone visit in order to better assess preoperative cardiovascular risk.  Pre-op covering staff: - Please schedule appointment and call patient to inform them. If patient already had an upcoming appointment within acceptable timeframe, please add "pre-op clearance" to the appointment notes so provider is aware. - Please contact requesting surgeon's office via preferred method (i.e, phone, fax) to inform them of need for appointment prior to surgery.  No medications indicated as needing held.  Elgie Collard, PA-C  10/08/2022, 4:19 PM

## 2022-10-12 NOTE — Progress Notes (Unsigned)
Virtual Visit via Telephone Note   Because of Jill Jenkins's co-morbid illnesses, she is at least at moderate risk for complications without adequate follow up.  This format is felt to be most appropriate for this patient at this time.  The patient did not have access to video technology/had technical difficulties with video requiring transitioning to audio format only (telephone).  All issues noted in this document were discussed and addressed.  No physical exam could be performed with this format.  Please refer to the patient's chart for her consent to telehealth for [facility].  Evaluation Performed:  Preoperative cardiovascular risk assessment _____________   Date:  10/12/2022   Patient ID:  Jill Jenkins, DOB 01-01-1966, MRN [id number] Patient Location:  Home Provider location:   Office  Primary Care Provider:  Chesley Noon, MD Primary Cardiologist:  Skeet Latch, MD  Chief Complaint / Patient Profile   57 y.o. 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  History of Present Illness    Jill Jenkins is a 57 y.o. female who presents via audio/video conferencing for a telehealth visit today.  Pt was last seen in cardiology clinic on 01/28/22 by Dr. Oval Linsey.  At that time Jill Jenkins was having chest discomfort for which Dr. Oval Linsey ordered CMR. CMR 09/20/22 revealed no recurrence of pericardial cyst and stable measurement of ascening aorta at 42 mm. The patient is now pending procedure as outlined above. Since her last visit, she denies chest pain, shortness of breath, lower extremity edema, fatigue, palpitations, melena, hematuria, hemoptysis, diaphoresis, weakness, presyncope,  syncope, orthopnea, and PND. She remains active at home and doing yoga and is able is to achieve > 4 METS without concerning cardiac symptoms.   Past Medical History    Past Medical History:  Diagnosis Date   Aneurysm of ascending aorta ([place]) 10/13/2021   Insomnia    Low back pain    Menorrhagia 03/28/2012   Migraines    Palpitations 07/02/2021   Pericardial effusion    Renal calculus    Past Surgical History:  Procedure Laterality Date   DILATION AND CURETTAGE OF UTERUS     INTERCOSTAL NERVE BLOCK Right 02/20/2021   Procedure: INTERCOSTAL NERVE BLOCK;  Surgeon: Lajuana Matte, MD;  Location: [place];  Service: Thoracic;  Laterality: Right;   POLYPECTOMY  03/29/2012   Procedure: POLYPECTOMY;  Surgeon: Thornell Sartorius, MD;  Location: [facility];  Service: Gynecology;  Laterality: N/A;   XI ROBOTIC ASSISTED PERICARDIAL WINDOW Right 02/20/2021   Procedure: XI ROBOTIC ASSISTED THORACOSCOPY PERICARDIAL CYST RESECTION;  Surgeon: Lajuana Matte, MD;  Location: [facility];  Service: Thoracic;  Laterality: Right;    Allergies  Allergies  Allergen Reactions   No Known Allergies     Home Medications    Prior to Admission medications   Medication Sig Start Date End Date Taking? Authorizing Provider  eletriptan (RELPAX) 40 MG tablet TAKE 1 TABLETBY MOUTH AS NEEDED FOR MIGRAINE OR HEADACHE (FOR ACUTE MIGRAINE TREATMENT MAY REPEAT X1 AS NEEED >2 HRS MAX DOSE '80MG'$ /24HR). 09/21/22   Frann Rider, NP  estradiol (ESTRACE) 0.1 MG/GM vaginal cream Place 1 Applicatorful vaginally as needed. As Needed    [provider]  Galcanezumab-gnlm (EMGALITY) 120 MG/ML SOAJ Inject 120 mg into the skin every 30 (  thirty) days. 09/21/22   Frann Rider, NP  topiramate (TOPAMAX) 25 MG tablet Take 1 tablet (25 mg total) by mouth 2 (two) times daily. Patient not taking: Reported on 10/08/2022 09/18/21   Frann Rider, NP  topiramate (TOPAMAX) 50 MG tablet Take 1 tablet (50 mg total) by mouth 2 (two) times daily.  09/18/21   Frann Rider, NP  Tretinoin Microsphere 0.06 % GEL Retin-A Micro Pump 0.06 % topical gel  1 APPLICATION THIN LAYER TO FACE NIGHTLY    [provider]    Physical Exam    Vital Signs:  [place] does not have vital signs available for review today.  Given telephonic nature of communication, physical exam is limited. AAOx3. NAD. Normal affect.  Speech and respirations are unlabored.  Accessory Clinical Findings    None  Assessment & Plan    1.  Preoperative Cardiovascular Risk Assessment: The patient is doing well from a cardiac perspective. Therefore, based on ACC/AHA guidelines, the patient would be at acceptable risk for the planned procedure without further cardiovascular testing. According to the Revised Cardiac Risk Index (RCRI), her Perioperative Risk of Major Cardiac Event is (%): 0.4 Her Functional Capacity in METs is: 7.59 according to the [place] Activity Status Index (DASI).  The patient was advised that if she develops new symptoms prior to surgery to contact our office to arrange for a follow-up visit, and she verbalized understanding.   A copy of this note will be routed to requesting surgeon.  Time:   Today, I have spent 10 minutes with the patient with telehealth technology discussing medical history, symptoms, and management plan.     Emmaline Life, NP-C  10/13/2022, 10:07 AM [address] Office [phone] Fax [phone]

## 2022-10-13 ENCOUNTER — Ambulatory Visit: Payer: BC Managed Care – PPO | Attending: Internal Medicine | Admitting: Nurse Practitioner

## 2022-10-13 DIAGNOSIS — Z01810 Encounter for preprocedural cardiovascular examination: Secondary | ICD-10-CM | POA: Diagnosis not present

## 2022-10-15 NOTE — Telephone Encounter (Signed)
Left message pt has been cleared and notes were faxed to surgeon office 10/13/22 per Christen Bame, NP. Pt had appt tele appt with Christen Bame, NP and advised pt has been cleared, see clearance notes. I will re-fax in case the surgeon office has stated to the pt that they did not received the notes.

## 2022-10-15 NOTE — Telephone Encounter (Signed)
Pt is requesting return call in regards to clearance.

## 2022-10-15 NOTE — Telephone Encounter (Signed)
Pt is calling. Stated she wanted to let Janett Billow know that she haven't got approved for Galcanezumab-gnlm [facility]) 120 MG/ML SOA yet. Pt is asking for a call-back.

## 2022-10-15 NOTE — Telephone Encounter (Signed)
Contact pt back, informed her I got the appeal approved. Coverage Start Date:09/07/2022;Coverage End Date:10/14/2023 Also advised Can decrease topamax down to '50mg'$  twice daily for 1 week then decrease down to '25mg'$  twice daily for 1 week then discontinue. She verbally understood and was appreciative.

## 2022-10-22 ENCOUNTER — Encounter (HOSPITAL_BASED_OUTPATIENT_CLINIC_OR_DEPARTMENT_OTHER): Payer: Self-pay | Admitting: *Deleted

## 2022-11-05 ENCOUNTER — Encounter (HOSPITAL_BASED_OUTPATIENT_CLINIC_OR_DEPARTMENT_OTHER): Payer: Self-pay | Admitting: Cardiovascular Disease

## 2022-11-05 ENCOUNTER — Ambulatory Visit (INDEPENDENT_AMBULATORY_CARE_PROVIDER_SITE_OTHER): Payer: BC Managed Care – PPO | Admitting: Cardiovascular Disease

## 2022-11-05 VITALS — BP 124/86 | HR 67 | Ht 69.0 in | Wt 218.7 lb

## 2022-11-05 DIAGNOSIS — Q248 Other specified congenital malformations of heart: Secondary | ICD-10-CM | POA: Diagnosis not present

## 2022-11-05 DIAGNOSIS — I7121 Aneurysm of the ascending aorta, without rupture: Secondary | ICD-10-CM | POA: Diagnosis not present

## 2022-11-05 NOTE — Assessment & Plan Note (Signed)
Resected in 2022.  She had some pericarditis after but none recently.  No recurrence on MRI 09/2022.  Repeat in one year.

## 2022-11-05 NOTE — Progress Notes (Signed)
Cardiology Office Note:    Date:  11/05/2022   ID:  Jill Jenkins, Jill Jenkins 1965/09/17, MRN [id number]  PCP:  Chesley Noon, MD   [facility] Providers Cardiologist:  Skeet Latch, MD     Referring MD: Chesley Noon, MD   No chief complaint on file.   History of Present Illness:    Jill Jenkins is a 57 y.o. female with a hx of pericardial cyst and ascending aortic aneurysm coming in for follow-up. She was seen in the hospital 02/15/2021 presenting with severe chest pain and shortness of breath. She was also reported for a syncopal episode lasting 3 minutes associated with tremors of the R hand. 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 TSH was normal. There was concern for acute pericarditis so she was started on colchicine for 3 months and ibuprofen for 1 month. She had the cyst surgically removed via robotic thoracoscopy by Dr. Kipp Brood on 02/20/21. She followed up with Coletta Memos, NP later that month and was doing well. There were no abnormal cells on pathology.  After her cyst-removal surgery she noted palpitations. Cardiac MRI 07/2021 showed no recurrent pericardial cyst and her ascending aorta was unchanged at 4.0 cm. 30-day Zio monitor showed sinus rhythm with rare PVCs and PACS.  At her last appointment, she reported pain in her back and plantar fasciitis. She was taking prednisone and doing yoga to alleviate the pain. She had been recommended surgery but was not interested at the time. From a cardiovascular standpoint, she was doing well. She called the office 10/21/21 reporting intermittent chest pain that worsened with laying down or deep breathing starting last weekend. These symptoms were similar to when she previously had pericarditis.   She was sitting in a chair and noticed shooting nerve pain across her R abdomen at her surgery site. She was unable to stay seated on that side and she had associated shortness of breath. Her  chest started hurting the day after and she was unsure if the two pains were related. By Sunday, her chest was burning similar to acid reflux and she was burping. The pain worsened when laying down. The symptoms were similar to when she had pericarditis. She took Tylenol, Advil, and Bayer for pain relief. The side pain had resolved but she continued to feel lingering chest pain.   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  She was started on ibuprofen and prednisone was discontinued. ESR was within normal limits at the time. She reported palpitations and wore a monitor 07/2021 that showed rare PACs and PVCs. Repeat echo 01/2022 revealed LVEF 60-65% and no recurrent pericardial cyst. Cardiac MRI 09/2022 was stable and no recurrent cyst.  Today, she appears well. On 10/17/22 she took the first dose of Emgality. She has had a couple headaches and one migraine since then which she notes is not bad. She is now off of topamax for migraines, which she had been taking for 15 years. She confirms having upcoming surgery on the 12th. Additionally she has struggled with chronic back pain. She has felt tightening and compacting sensations in her frontal neck as well. She has been referred to physical therapy for spinal stenosis on recent MRI. At times she has experienced numbness in her arms down to her elbow. For exercise she has returned to practicing yoga, as well as a weight lifting class. Not as much cardio exercise. This week she walked twice at  the park. She denies any palpitations, chest pain, shortness of breath, or peripheral edema. No lightheadedness, syncope, orthopnea, or PND.   Past Medical History:  Diagnosis Date   Aneurysm of ascending aorta ([place]) 10/13/2021   Insomnia    Low back pain    Menorrhagia 03/28/2012   Migraines    Palpitations 07/02/2021   Pericardial effusion    Renal calculus     Past Surgical History:  Procedure Laterality Date   DILATION AND CURETTAGE OF  UTERUS     INTERCOSTAL NERVE BLOCK Right 02/20/2021   Procedure: INTERCOSTAL NERVE BLOCK;  Surgeon: Lajuana Matte, MD;  Location: [place];  Service: Thoracic;  Laterality: Right;   POLYPECTOMY  03/29/2012   Procedure: POLYPECTOMY;  Surgeon: Thornell Sartorius, MD;  Location: [facility];  Service: Gynecology;  Laterality: N/A;   XI ROBOTIC ASSISTED PERICARDIAL WINDOW Right 02/20/2021   Procedure: XI ROBOTIC ASSISTED THORACOSCOPY PERICARDIAL CYST RESECTION;  Surgeon: Lajuana Matte, MD;  Location: [facility];  Service: Thoracic;  Laterality: Right;    Current Medications: Current Meds  Medication Sig   eletriptan (RELPAX) 40 MG tablet TAKE 1 TABLETBY MOUTH AS NEEDED FOR MIGRAINE OR HEADACHE (FOR ACUTE MIGRAINE TREATMENT MAY REPEAT X1 AS NEEED >2 HRS MAX DOSE 80MG/24HR).   estradiol (ESTRACE) 0.1 MG/GM vaginal cream Place 1 Applicatorful vaginally as needed. As Needed   Galcanezumab-gnlm (EMGALITY) 120 MG/ML SOAJ Inject 120 mg into the skin every 30 (thirty) days.   Travoprost, BAK Free, (TRAVATAN) 0.004 % SOLN ophthalmic solution Place 1 drop into both eyes at bedtime.   Tretinoin Microsphere 0.06 % GEL Retin-A Micro Pump 0.06 % topical gel  1 APPLICATION THIN LAYER TO FACE NIGHTLY     Allergies:   No known allergies   Social History   Socioeconomic History   Marital status: Married    Spouse name: Not on file   Number of children: Not on file   Years of education: Not on file   Highest education level: Not on file  Occupational History   Not on file  Tobacco Use   Smoking status: Never   Smokeless tobacco: Never  Substance and Sexual Activity   Alcohol use: No   Drug use: No   Sexual activity: Yes    Birth control/protection: Surgical  Other Topics Concern   Not on file  Social History Narrative   Not on file   Social Determinants of Health   Financial Resource Strain: Not on file  Food Insecurity: Not on file  Transportation Needs: Not on file  Physical Activity: Not on file   Stress: Not on file  Social Connections: Not on file     Family History: The patient's family history includes Atrial fibrillation in her father and mother; Hypertension in her father and mother; Stroke in her maternal grandfather.  ROS:   Please see the history of present illness.    (+) Migraines/headaches (+) Chronic back and neck pain All other systems reviewed and are negative.  EKGs/Labs/Other Studies Reviewed:    The following studies were reviewed today:  Cardiac MRI  09/18/2022: IMPRESSION: 1.  S/p pericardial cyst resection.  No evidence of recurrence   2.  Normal LV size, wall thickness, and systolic function (EF [id number])   3.  Normal RV size and systolic function (EF [id number])   4.  No late gadolinium enhancement to suggest myocardial scar   5.  Mild tricuspid regurgitation (regurgitant fraction 14%)   6.  Dilated ascending aorta measuring 57m  Echo  01/26/2022: 1. Left ventricular ejection fraction, by estimation, is 60 to 65%. The  left ventricle has normal function. The left ventricle has no regional  wall motion abnormalities. Left ventricular diastolic function could not  be evaluated.   2. Right ventricular systolic function is normal. The right ventricular  size is normal. There is normal pulmonary artery systolic pressure. The  estimated right ventricular systolic pressure is 99991111 mmHg.   3. The mitral valve is grossly normal. Mild mitral valve regurgitation.  No evidence of mitral stenosis.   4. The aortic valve is grossly normal. Aortic valve regurgitation is not  visualized. No aortic stenosis is present.   5. There is moderate dilatation of the ascending aorta, measuring 43 mm.   6. The inferior vena cava is normal in size with greater than 50%  respiratory variability, suggesting right atrial pressure of 3 mmHg.   Comparison(s): No significant change from prior study.   Monitor 08/12/21 30-day event Monitor   Quality: Fair.  Baseline  artifact. Predominant rhythm: Sinus rhythm Average heart rate: 69 bpm Max heart rate: 132 bpm Min heart rate: 47 bpm Pauses >2.5 seconds: None   Less than 1% PVC Less than 1% PAC  MRI Cardiac 08/04/21 1.  S/p resection of pericardial cyst.  No evidence of recurrence   2. Normal LV size and wall thickness with hyperdynamic systolic function (EF [id number]). No LGE to suggest myocardial scar   3.  Normal RV size and systolic function (EF [id number])   4.  Dilated ascending aorta measuring 66m   5. Small pericardial effusion anterior to RV  Cardiac MRI 02/17/2021 1. There is a 410mx 3895m 35m36mss adjacent to the right atrium. Mass is hypointense to myocardium on T1 weighted imaging, hyperintense on T2 weighted imaging, does not suppress with fat saturation, no contrast uptake on first pass perfusion, and no late gadolinium enhancement. This is consistent with a pericardial cyst.   2. Pericardial LGE and hyperenhancement on T2 weighted imaging suggests pericardial inflammation, consistent with acute pericarditis   3. Small pericardial effusion measuring up to 7mm 15macent to LV lateral wall   4. Normal LV size, wall thickness, and systolic function (EF 67%).[id number] LGE to suggest myocardial scar   5.  Normal RV size and systolic function (EF 64%) [id number].  Dilated ascending aorta measuring 40mm 44mo 02/16/2021 1. Left ventricular ejection fraction, by estimation, is 60 to 65%. The  left ventricle has normal function. The left ventricle has no regional  wall motion abnormalities. Left ventricular diastolic parameters were  normal.   2. Right ventricular systolic function is normal. The right ventricular  size is normal. There is normal pulmonary artery systolic pressure.   3. The mitral valve is normal in structure. No evidence of mitral valve  regurgitation. No evidence of mitral stenosis.   4. The aortic valve is tricuspid. Aortic valve regurgitation is not  visualized. No aortic  stenosis is present.   5. The inferior vena cava is normal in size with greater than 50%  respiratory variability, suggesting right atrial pressure of 3 mmHg.   Lower Venous DVT 02/16/2021 RIGHT:  - There is no evidence of deep vein thrombosis in the lower extremity.  - No cystic structure found in the popliteal fossa.     LEFT:  - There is no evidence of deep vein thrombosis in the lower extremity.  - No cystic structure found in the popliteal fossa.   CTA Chest  02/16/2021 1. No pulmonary embolus. 2. No acute intrathoracic abnormality. 3. 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 4. Multiple fluid density lesions within the liver statistically likely represent hepatic cysts.  EKG:  EKG is personally reviewed. 11/05/2022:  Sinus rhythm. Rate 67 bpm. 10/23/21: Sinus rhythm, rate 66 bpm  Recent Labs: 09/15/2022: BUN 14; Creatinine, Ser 0.99; Potassium 4.4; Sodium 140   Recent Lipid Panel    Component Value Date/Time   CHOL 192 02/16/2021 0200   TRIG 133 02/16/2021 0200   HDL 48 02/16/2021 0200   CHOLHDL 4.0 02/16/2021 0200   VLDL 27 02/16/2021 0200   LDLCALC 117 (H) 02/16/2021 0200     Physical Exam:    VS:  BP 124/86 (BP Location: Left Arm, Patient Position: Sitting, Cuff Size: Large)   Pulse 67   Ht 5' 9"$  (1.753 m)   Wt 218 lb 11.2 oz (99.2 kg)   BMI 32.30 kg/m  , BMI Body mass index is 32.3 kg/m. GENERAL:  Well appearing HEENT: Pupils equal round and reactive, fundi not visualized, oral mucosa unremarkable NECK:  No jugular venous distention, waveform within normal limits, carotid upstroke brisk and symmetric, no bruits, no thyromegaly LUNGS:  Clear to auscultation bilaterally HEART:  RRR.  PMI not displaced or sustained,S1 and S2 within normal limits, no S3, no S4, no clicks, no rubs,  no murmurs ABD:  Flat, positive bowel sounds normal in frequency in pitch, no bruits, no rebound, no guarding, no midline pulsatile mass, no hepatomegaly, no splenomegaly EXT:  2  plus pulses throughout, no edema, no cyanosis no clubbing SKIN:  No rashes no nodules NEURO:  Cranial nerves II through XII grossly intact, motor grossly intact throughout PSYCH:  Cognitively intact, oriented to person place and time   ASSESSMENT:    1. Aneurysm of ascending aorta without rupture ([place])   2. Pericardial cyst     PLAN:    Aneurysm of ascending aorta Stable at 4.0-4.2 cm.  Repeat in one year.  BP is controlled.  Pericardial cyst Resected in 2022.  She had some pericarditis after but none recently.  No recurrence on MRI 09/2022.  Repeat in one year.    Disposition: FU with Berenis Corter C. Oval Linsey, MD, [facility] in 1 year.  Medication Adjustments/Labs and Tests Ordered: Current medicines are reviewed at length with the patient today.  Concerns regarding medicines are outlined above.   Orders Placed This Encounter  Procedures   MR CARDIAC MORPHOLOGY W WO CONTRAST   EKG 12-Lead   No orders of the defined types were placed in this encounter.  Patient Instructions  Medication Instructions:  Your physician recommends that you continue on your current medications as directed. Please refer to the Current Medication list given to you today.  *If you need a refill on your cardiac medications before your next appointment, please call your pharmacy*  Lab Work: NONE  Testing/Procedures: CARDIAC MRI IN 1 YEAR, FOLLOW UP AFTER   Follow-Up: At [facility], you and your health needs are our priority.  As part of our continuing mission to provide you with exceptional heart care, we have created designated Provider Care Teams.  These Care Teams include your primary Cardiologist (physician) and Advanced Practice Providers (APPs -  Physician Assistants and Nurse Practitioners) who all work together to provide you with the care you need, when you need it.  We recommend signing up for the patient portal called "MyChart".  Sign up information is provided on this After Visit  Summary.  MyChart is used to connect with patients for Virtual Visits (Telemedicine).  Patients are able to view lab/test results, encounter notes, upcoming appointments, etc.  Non-urgent messages can be sent to your provider as well.   To learn more about what you can do with MyChart, go to [email or web address].    Your next appointment:   12 month(s)  Provider:   Skeet Latch, MD      [facility],acting as a scribe for Skeet Latch, MD.,have documented all relevant documentation on the behalf of Skeet Latch, MD,as directed by  Skeet Latch, MD while in the presence of Skeet Latch, MD.  I, Zayante Oval Linsey, MD have reviewed all documentation for this visit.  The documentation of the exam, diagnosis, procedures, and orders on 11/05/2022 are all accurate and complete.   Signed, Skeet Latch, MD  11/05/2022 12:49 PM    [place]

## 2022-11-05 NOTE — Patient Instructions (Signed)
Medication Instructions:  Your physician recommends that you continue on your current medications as directed. Please refer to the Current Medication list given to you today.  *If you need a refill on your cardiac medications before your next appointment, please call your pharmacy*  Lab Work: NONE  Testing/Procedures: CARDIAC MRI IN 1 YEAR, FOLLOW UP AFTER   Follow-Up: At [facility], you and your health needs are our priority.  As part of our continuing mission to provide you with exceptional heart care, we have created designated Provider Care Teams.  These Care Teams include your primary Cardiologist (physician) and Advanced Practice Providers (APPs -  Physician Assistants and Nurse Practitioners) who all work together to provide you with the care you need, when you need it.  We recommend signing up for the patient portal called "MyChart".  Sign up information is provided on this After Visit Summary.  MyChart is used to connect with patients for Virtual Visits (Telemedicine).  Patients are able to view lab/test results, encounter notes, upcoming appointments, etc.  Non-urgent messages can be sent to your provider as well.   To learn more about what you can do with MyChart, go to [email or web address].    Your next appointment:   12 month(s)  Provider:   Skeet Latch, MD

## 2022-11-05 NOTE — Assessment & Plan Note (Signed)
Stable at 4.0-4.2 cm.  Repeat in one year.  BP is controlled.

## 2022-11-24 ENCOUNTER — Telehealth: Payer: Self-pay | Admitting: Cardiovascular Disease

## 2022-11-24 NOTE — Telephone Encounter (Signed)
I will fax over EKG to surgeon office.

## 2022-11-24 NOTE — Telephone Encounter (Signed)
Brooke, from [employer] called in stating pt has surgery today at 12:00pm but anesthesiologist needs most recent EKG faxed over before surgery.   Fax [phone]

## 2022-12-24 ENCOUNTER — Ambulatory Visit: Payer: BC Managed Care – PPO | Admitting: Adult Health

## 2023-01-11 ENCOUNTER — Ambulatory Visit (INDEPENDENT_AMBULATORY_CARE_PROVIDER_SITE_OTHER): Payer: BC Managed Care – PPO | Admitting: Neurology

## 2023-01-11 ENCOUNTER — Encounter: Payer: Self-pay | Admitting: Neurology

## 2023-01-11 ENCOUNTER — Other Ambulatory Visit: Payer: Self-pay | Admitting: Neurology

## 2023-01-11 VITALS — BP 129/98 | HR 73 | Ht 69.0 in | Wt 214.0 lb

## 2023-01-11 DIAGNOSIS — G43009 Migraine without aura, not intractable, without status migrainosus: Secondary | ICD-10-CM

## 2023-01-11 DIAGNOSIS — G43709 Chronic migraine without aura, not intractable, without status migrainosus: Secondary | ICD-10-CM

## 2023-01-11 MED ORDER — FREMANEZUMAB-VFRM 225 MG/1.5ML ~~LOC~~ SOSY
225.0000 mg | PREFILLED_SYRINGE | Freq: Once | SUBCUTANEOUS | Status: DC
Start: 2023-01-11 — End: 2023-02-02

## 2023-01-11 MED ORDER — AJOVY 225 MG/1.5ML ~~LOC~~ SOAJ
225.0000 mg | SUBCUTANEOUS | 11 refills | Status: DC
Start: 2023-01-11 — End: 2023-02-02

## 2023-01-11 MED ORDER — NURTEC 75 MG PO TBDP: 75.0000 mg | ORAL_TABLET | Freq: Every day | ORAL | 6 refills | Status: DC | PRN

## 2023-01-11 NOTE — Patient Instructions (Addendum)
I had a long discussion with the patient regarding her migraine headaches which.  Initially did well after switching from Topamax to [facility] but the last 1 month she has had 7-10 headaches despite being on maximum dose of Emgality  hence recommend switching to Ajovy.  I also recommend setting up appointment with with Dr.Ahern our Headache specialist for migraine management.

## 2023-01-11 NOTE — Progress Notes (Signed)
[facility] [address] [place]. [place] [address] [phone]       FOLLOW UP NOTE  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 Pearlean Brownie   Chief Complaint  Patient presents with   Follow-up    Pt in room #3 and alone. Pt here today for f/u on her migraines. Pt has concern for her topiramate.     HPI:  Update 01/11/2023 : She returns for follow-up after last visit with [name] 3 months ago.  Patient was switched from Topamax to [facility] due to side effects.  Initially well the first 2 months with barely any migraines but last month she has had about 7-10 migraines which is unusually high for her.  She does take Relpax for symptomatic relief.  Fairly well but headache may come back the next day.  She has not yet tried Nurtec or Ajovy.  He has no other new complaints. Update 09/21/2022 JM: Patient returns for yearly migraine follow-up.  Reports migraines continue to be well-controlled on topiramate 75 mg twice daily but continues to experience brain fog which has been gradually worsening.  Is unsure if continued worsening is from continue topiramate usage vs menopause vs losing her son back in March.  She questions other treatment options.  She has previously tried amitriptyline many years ago which initially worked well but eventually lost effectiveness.  She experienced 1 migraine in the month of December but can continue to have worsening migraines with barometric pressure changes or with occasional neck pain after sleeping wrong.      History provided for reference purposes only Update 09/18/2020 JM: Jill Jenkins returns today for a migraine follow-up. Her Topamax was increased in August to 75mg  BID after a flare-up and she is seeing some improvement, although does experience some brain fog which has been ongoing and slightly increased after  dosage increase. She plans on scheudling f/u with ophthalmology due to concerns of worsening far away vision - does have hx of Lasix procedure >15 yrs ago. She remains on Eletriptan with benefit without side effects.  She reports her HA are dependent on weather and changes in barometric pressure, but occur a couple times a month. They occur behind her right eye and described as throbbing. No changes to headaches. Denies aura. BP today 116/76. 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  No further concerns at this time  Update 01/09/2021 JM: Jill Jenkins returns for 39-month migraine follow-up  Reports about 2-3 migraine days per month but generally varies as her migraines are usually triggered by change in bariatric pressure or climate changes such as if she travels to [place] or [place] to see her family.  If she stays in this area without any traveling, she may experience 1 migraine per month Remains on topiramate 50 mg twice daily tolerating without side effects She remains on Relpax with continued benefit  She does report new onset allergies this season which she has not previously experienced.  She is also currently following with her OB/GYN regarding occasional menopausal symptoms and questions possible treatment options that would not interact with topiramate.  No further concerns at this time  Update 07/09/2020 JM: Jill Jenkins returns for migraine follow-up. She called office on 8/9 with complaints of worsening migraine headaches and recommended increasing Topamax dosage back to 50 mg twice daily with improvement of migraine occurrence.  She  remains on Topamax 50 mg twice daily without side effects and currently, migraine frequency can vary with some months only a couple migraines but other months may be up to 5 or 6.  She will use Relpax at migraine onset with occasional benefit but will typically experience an additional migraine the following day around the same time.  She has noticed triggers or  change of barometric pressure, alcohol and sweet foods such as cake.  No concerns at this time.  Update 01/08/2020 JM: Jill Jenkins returns for migraine follow-up.  Initially presented to our office in 05/2017 after recurrent stereotypical episodes of tightening of extremities followed by brief loss of consciousness and headaches of unclear significance potentially atypical migraine versus complex partial seizures which responded well to Topamax 100 mg twice daily.  Longstanding history of migraines. On 11/07/2018, seen in office by Dr. Pearlean Brownie for new complaints of dizziness, tremors, twitching and memory loss potentially medication related as all lab work and imaging unremarkable.  Recommended decreasing dosage of Topamax but at follow-up on 02/23/2019 she denies dosage decrease and spontaneous resolution of prior symptoms.  As migraine stable, it was recommended to decrease Topamax 50 mg twice daily to 25 mg twice daily.  Initially had difficulty with worsening migraine frequency but eventually subsided and has continued on Topamax 25 mg twice daily.  Typical migraine occurrence once monthly lasting for approximately 2 to 3 days and has had slight increase more recently with new onset of allergies this season.  Use of Relpax for emergent relief with benefit approximately 1 time monthly.  No concerns at this time.  Update 02/23/2019 JM: She is being seen today for follow-up regarding complaints of dizziness, tremors, memory twitching and mild memory impairment.  Extensive lab work-up unremarkable and repeat MRI without acute or new findings compared to prior MRI.  It was recommended at prior visit to decrease Topamax dose from 50 mg twice daily to 25 mg twice daily as migraines have been stable.  She did not end up decreasing Topamax dose as she had a lot of 50 mg capsules still and wanted to finish those prior to decreasing dose.  All prior symptoms have subsided without intervention.  Migraines have been stable and  will experience occasional migraine with weather changes.  No further concerns at this time.  11/07/2018 visit Dr. Pearlean Brownie: Jill Jenkins is referred today for evaluation for new complaints of dizziness, tremors, twitching's and memory loss for the last 3 to 4 months.  History is obtained from the patient and review of electronic medical records.  I have personally reviewed imaging films in PACS.  She is known to [place] for prior visits for a typical migraine and was last seen on 08/24/2018 by George Hugh nurse practitioner.  States her migraine headaches seem to be under good control and presently she takes Topamax 50 mg twice daily and gets headaches only once every 2 3 months or so.  Her main complaint today is that for the last 4 to 6 months she has had some progressively worsening symptoms.  She feels dizzy off balance.  She does yoga 4 days a week and at times noticed that she has to hold on to avoid falls.  She has had a few minor falls but no major injuries.  She denies true vertigo or presyncopal symptoms or loss of consciousness.  She is also noticed a mild tremor of her right hand off-and-on.  It is very fine and intermittent and does not interfere with activities  of daily living.  She also feels that her body is quite rigid and at times she is grinding her teeth.  She is also noticed occasional twitching's of the muscles involving her shoulder and the neck.  She also complains of mild short-term memory difficulties.  She denies any prior history of depression or anxiety but feels that she has a underlying fear that she may develop Parkinson's disease as her mother suffered from this disease.  She has lost about 50 pounds in the last 1 year on a strict keto diet and she states she is eating healthy.  She denies any significant headaches, loss of consciousness, double vision, gait or balance difficulties.  She has had no recent lab work or brain imaging study done.      ROS:   14 system review of  systems is positive for allergies and all other systems negative  PMH:  Past Medical History:  Diagnosis Date   Aneurysm of ascending aorta (HCC) 10/13/2021   Insomnia    Low back pain    Menorrhagia 03/28/2012   Migraines    Palpitations 07/02/2021   Pericardial effusion    Renal calculus     Social History:  Social History   Socioeconomic History   Marital status: Married    Spouse name: Not on file   Number of children: Not on file   Years of education: Not on file   Highest education level: Not on file  Occupational History   Not on file  Tobacco Use   Smoking status: Never   Smokeless tobacco: Never  Substance and Sexual Activity   Alcohol use: No   Drug use: No   Sexual activity: Yes    Birth control/protection: Surgical  Other Topics Concern   Not on file  Social History Narrative   Not on file   Social Determinants of Health   Financial Resource Strain: Not on file  Food Insecurity: Not on file  Transportation Needs: Not on file  Physical Activity: Not on file  Stress: Not on file  Social Connections: Not on file  Intimate Partner Violence: Not on file    Medications:   Current Outpatient Medications on File Prior to Visit  Medication Sig Dispense Refill   eletriptan (RELPAX) 40 MG tablet TAKE 1 TABLETBY MOUTH AS NEEDED FOR MIGRAINE OR HEADACHE (FOR ACUTE MIGRAINE TREATMENT MAY REPEAT X1 AS NEEED >2 HRS MAX DOSE 80MG /24HR). 6 tablet 1   estradiol (ESTRACE) 0.1 MG/GM vaginal cream Place 1 Applicatorful vaginally as needed. As Needed     topiramate (TOPAMAX) 25 MG tablet Take 1 tablet (25 mg total) by mouth 2 (two) times daily. 180 tablet 3   topiramate (TOPAMAX) 50 MG tablet Take 1 tablet (50 mg total) by mouth 2 (two) times daily. 180 tablet 3   Tretinoin Microsphere 0.06 % GEL Retin-A Micro Pump 0.06 % topical gel  1 APPLICATION THIN LAYER TO FACE NIGHTLY     No current facility-administered medications on file prior to visit.    Allergies:    Allergies  Allergen Reactions   No Known Allergies    Vitals: Today's Vitals   09/21/22 1320  BP: 116/68  Pulse: 68  Weight: 214 lb 6.4 oz (97.3 kg)  Height: 5\' 9"  (1.753 m)   Body mass index is 31.66 kg/m.  Physical exam: General: well developed, well nourished, very pleasant middle-age Caucasian female, seated, in no evident distress Head: head normocephalic and atraumatic.   Neck: supple with no carotid or  supraclavicular bruits Cardiovascular: regular rate and rhythm, no murmurs Musculoskeletal: no deformity Skin:  no rash/petichiae Vascular:  Normal pulses all extremities   Neurologic Exam Mental Status: Awake and fully alert.   Fluent speech and language.  Oriented to place and time. Recent memory impaired and remote memory intact. Attention span, concentration and fund of knowledge appropriate. Mood and affect appropriate.  Cranial Nerves: Pupils equal, briskly reactive to light. Extraocular movements full without nystagmus. Visual fields full to confrontation. Hearing intact. Facial sensation intact. Face, tongue, palate moves normally and symmetrically.  Motor: Normal bulk and tone. Normal strength in all tested extremity muscles. Sensory.: intact to touch , pinprick , position and vibratory sensation.  Coordination: Rapid alternating movements normal in all extremities. Finger-to-nose and heel-to-shin performed accurately bilaterally. Gait and Station: Arises from chair without difficulty. Stance is normal. Gait demonstrates normal stride length and balance without use of assistive device Reflexes: 1+ and symmetric. Toes downgoing.        ASSESSMENT/PLAN:  Jill Jenkins is a 57 year old Caucasian Jenkins referred to the office with initial evaluation on 05/26/2017 with Dr. Pearlean Brownie for recurrent stereotypical episodes of tightening of extremities followed by brief loss of consciousness and headaches of unclear significance diagnosed with atypical migraine which responded  well to topiramate.  Longstanding history of migraine headaches since age of 68.  Abnormal MRI showing nonspecific white matter hyperintensities likely migraine related. Worsening of migraine headaches in 04/2021 which improved after steroid taper pack and increase Topamax dosage.   She was switched from Topamax to [facility] due to side effects but after initial response she is having breakthrough headaches   -I had a long discussion with the patient regarding her migraine headaches which.  Initially did well after switching from Topamax to [facility] but the last 1 month she has had 7-10 headaches despite being on maximum dose of Emgality  hence recommend switching to Ajovy.  I also recommend setting up appointment with with Dr.Ahern our Headache specialist for migraine management.  CC:  Eartha Inch, MD     I spent 35 minutes of face-to-face and non-face-to-face time with patient.  This included previsit chart review, lab review, study review, order entry, electronic health record documentation, patient education and discussion with Dr. Lucia Gaskins about treatment plan regarding the above and answered all the questions to patient's satisfaction   Delia Heady, MD  [facility] [address] [place], [place] [address]  Phone [phone] Fax [phone] Note: This document was prepared with digital dictation and possible smart phrase technology. Any transcriptional errors that result from this process are unintentional.

## 2023-01-19 ENCOUNTER — Telehealth: Payer: Self-pay | Admitting: *Deleted

## 2023-01-19 NOTE — Telephone Encounter (Signed)
Received fax from [facility] stating Ajovy PA is needed.

## 2023-01-21 ENCOUNTER — Telehealth: Payer: Self-pay

## 2023-01-21 ENCOUNTER — Other Ambulatory Visit (HOSPITAL_COMMUNITY): Payer: Self-pay

## 2023-01-21 NOTE — Telephone Encounter (Signed)
Pharmacy Patient Advocate Encounter  Prior Authorization for AJOVY (fremanezumab-vfrm) injection 225MG /1.5ML auto-injectors has been approved by [employer] (ins).    PA # PA Case ID: [id number] Effective dates: 12/22/2022 through 01/21/2024 Key: [id number]

## 2023-01-21 NOTE — Telephone Encounter (Signed)
A new telephone call encounter has been made for this PA Request-please see telephone note dated 01/21/2023. 

## 2023-01-25 NOTE — Telephone Encounter (Signed)
Spoke with [employer] and the staff member was able to get the Ajovy to go through. They will order it and notify the patient when its ready.

## 2023-02-01 ENCOUNTER — Other Ambulatory Visit (HOSPITAL_COMMUNITY): Payer: Self-pay

## 2023-02-01 NOTE — Telephone Encounter (Signed)
Pharmacy Patient Advocate Encounter  Prior Authorization for AJOVY (fremanezumab-vfrm) injection 225MG /1.5ML auto-injectors has been approved by [employer] (ins).    PA # PA Case ID #: [id number] Effective dates: 12/22/2022 through 12/22/2022

## 2023-02-02 ENCOUNTER — Ambulatory Visit (INDEPENDENT_AMBULATORY_CARE_PROVIDER_SITE_OTHER): Payer: BC Managed Care – PPO | Admitting: Neurology

## 2023-02-02 ENCOUNTER — Encounter: Payer: Self-pay | Admitting: Neurology

## 2023-02-02 ENCOUNTER — Telehealth: Payer: Self-pay | Admitting: *Deleted

## 2023-02-02 VITALS — BP 124/88 | HR 82 | Ht 69.0 in | Wt 213.0 lb

## 2023-02-02 DIAGNOSIS — G43709 Chronic migraine without aura, not intractable, without status migrainosus: Secondary | ICD-10-CM | POA: Diagnosis not present

## 2023-02-02 NOTE — Telephone Encounter (Signed)
Chronic Migraine CPT 64615  Botox J0585 Units: 200  G43.709 Chronic Migraine without aura, not intractable, without status migrainous  --------- From Dr Lucia Gaskins:   Please start botox for migraine protocol:   Dr. Pearlean Brownie asked for [facility] to me for migraines. Appears patient has been on multiple medications and still has burden. She had side effects to topamax. Emgality stopped working. She was started on Ajovy last month and doesn't like the injections had side effects. Migraines worked well on Topiramate but she had side effects, worsening brain fog. Tried amitriptyline had side effects made her sick. Uses Relpax. Tried imitrex.  Aimovig did not work. On Ajovy she is still having > 8 moderate to severe migraine days a month and > 15 total headache days a month. No aura. No medication overuse. Will change to botox, stop Ajovy, at some point when we can get it approved she liked nurtec samples.   ANY NP CAN INJECT Botox PLEASE   G43.709

## 2023-02-02 NOTE — Progress Notes (Signed)
[facility] [address] [place]. [place] [address] [phone]       FOLLOW UP NOTE  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 Pearlean Jenkins   Chief Complaint  Patient presents with   RM 4    Patient is here alone for migraine consultation with Dr Jill Jenkins. She is off Topamax and on Ajovy now. She has had some severe migraines since then. The Topamax has helped the most. She said her migraines are different/worse, some dizziness, nausea, pressure/ringing in ears. Typically she would have the migraine on the R side now more of her head is affected.      HPI:  Update 02/02/2023: Dr. Pearlean Jenkins asked for [facility] to me for migraines. Appears patient has been on multiple medications and still has burden. She had side effects to topamax. Emgality stopped working. She was started on Ajovy last month and doesn't like the injections had side effects. Migraines worked well on Topiramate but she had side effects, worsening brain fog. Tried amitriptyline had side effects made her sick. Uses Relpax. Tried imitrex.  Aimovig did not work. On Ajovy she is still having > 8 moderate to severe migraine days a month and > 15 total headache days a month. No aura. No medication overuse. Will change to botox, stop Ajovy, at some point when we can get it approved she liked nurtec samples.   MRI brain 2020: IMPRESSION:    MRI brain (without) demonstrating: - Few subcortical foci of non-specific gliosis. - No acute findings. No change from MRI on 06/09/17.   Patient complains of symptoms per HPI as well as the following symptoms: migraine . Pertinent negatives and positives per HPI. All others negative   Update 01/11/2023 : She returns for follow-up after last visit with Jill Jenkins 3 months ago.  Patient was switched from Topamax to [facility] due to side effects.  Initially well the  first 2 months with barely any migraines but last month she has had about 7-10 migraines which is unusually high for her.  She does take Relpax for symptomatic relief.  Fairly well but headache may come back the next day.  She has not yet tried Nurtec or Ajovy.  He has no other new complaints. Update 09/21/2022 Jill Jenkins: Patient returns for yearly migraine follow-up.  Reports migraines continue to be well-controlled on topiramate 75 mg twice daily but continues to experience brain fog which has been gradually worsening.  Is unsure if continued worsening is from continue topiramate usage vs menopause vs losing her son back in March.  She questions other treatment options.  She has previously tried amitriptyline many years ago which initially worked well but eventually lost effectiveness.  She experienced 1 migraine in the month of December but can continue to have worsening migraines with barometric pressure changes or with occasional neck pain after sleeping wrong.      History provided for reference purposes only Update 09/18/2020 Jill Jenkins: [name] returns today for a migraine follow-up. Her Topamax was increased in August to 75mg  BID after a flare-up and she is seeing some improvement, although does experience some brain fog which has been ongoing and slightly increased after dosage increase. She plans on scheudling f/u with ophthalmology due to concerns of worsening far away vision - does have hx of Lasix procedure >15 yrs ago. She remains on Eletriptan with benefit without side effects.  She reports her HA are dependent on weather and changes in barometric pressure, but occur a couple times a month. They occur behind her right eye and described as throbbing. No changes to headaches. Denies aura. BP today 116/76. 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  No further concerns at this time  Update 01/09/2021 Jill Jenkins: [name] returns for 20-month migraine follow-up  Reports about 2-3 migraine days per month but  generally varies as her migraines are usually triggered by change in bariatric pressure or climate changes such as if she travels to [place] or [place] to see her family.  If she stays in this area without any traveling, she may experience 1 migraine per month Remains on topiramate 50 mg twice daily tolerating without side effects She remains on Relpax with continued benefit  She does report new onset allergies this season which she has not previously experienced.  She is also currently following with her OB/GYN regarding occasional menopausal symptoms and questions possible treatment options that would not interact with topiramate.  No further concerns at this time  Update 07/09/2020 Jill Jenkins: [name] returns for migraine follow-up. She called office on 8/9 with complaints of worsening migraine headaches and recommended increasing Topamax dosage back to 50 mg twice daily with improvement of migraine occurrence.  She remains on Topamax 50 mg twice daily without side effects and currently, migraine frequency can vary with some months only a couple migraines but other months may be up to 5 or 6.  She will use Relpax at migraine onset with occasional benefit but will typically experience an additional migraine the following day around the same time.  She has noticed triggers or change of barometric pressure, alcohol and sweet foods such as cake.  No concerns at this time.  Update 01/08/2020 Jill Jenkins: [name] returns for migraine follow-up.  Initially presented to our office in 05/2017 after recurrent stereotypical episodes of tightening of extremities followed by brief loss of consciousness and headaches of unclear significance potentially atypical migraine versus complex partial seizures which responded well to Topamax 100 mg twice daily.  Longstanding history of migraines. On 11/07/2018, seen in office by Dr. Pearlean Jenkins for new complaints of dizziness, tremors, twitching and memory loss potentially medication related as all lab  work and imaging unremarkable.  Recommended decreasing dosage of Topamax but at follow-up on 02/23/2019 she denies dosage decrease and spontaneous resolution of prior symptoms.  As migraine stable, it was recommended to decrease Topamax 50 mg twice daily to 25 mg twice daily.  Initially had difficulty with worsening migraine frequency but eventually subsided and has continued on Topamax 25 mg twice daily.  Typical migraine occurrence once monthly lasting for approximately 2 to 3 days and has had slight increase more recently with new onset of allergies this season.  Use of Relpax for emergent relief with benefit approximately 1 time monthly.  No concerns at this time.  Update 02/23/2019 Jill Jenkins: She is being seen today for follow-up regarding complaints of dizziness, tremors, memory twitching and mild memory impairment.  Extensive lab work-up unremarkable and repeat MRI without acute or new findings compared to prior MRI.  It was recommended at prior visit to decrease Topamax dose from 50 mg twice daily to 25 mg twice daily as migraines have been stable.  She did not end up decreasing Topamax dose as she had a lot of 50 mg capsules still and wanted to finish those prior to decreasing dose.  All prior symptoms have subsided without intervention.  Migraines have been stable and will experience occasional migraine with weather changes.  No further concerns at this time.  11/07/2018 visit Dr. Pearlean Jenkins: Ms. Sanon is referred today for evaluation for new complaints of dizziness, tremors, twitching's and memory loss for the last 3 to 4 months.  History is obtained from the patient and review of electronic medical records.  I have personally reviewed imaging films in PACS.  She is known to [place] for prior visits for a typical migraine and was last seen on 08/24/2018 by George Hugh nurse practitioner.  States her migraine headaches seem to be under good control and presently she takes Topamax 50 mg twice daily and gets  headaches only once every 2 3 months or so.  Her main complaint today is that for the last 4 to 6 months she has had some progressively worsening symptoms.  She feels dizzy off balance.  She does yoga 4 days a week and at times noticed that she has to hold on to avoid falls.  She has had a few minor falls but no major injuries.  She denies true vertigo or presyncopal symptoms or loss of consciousness.  She is also noticed a mild tremor of her right hand off-and-on.  It is very fine and intermittent and does not interfere with activities of daily living.  She also feels that her body is quite rigid and at times she is grinding her teeth.  She is also noticed occasional twitching's of the muscles involving her shoulder and the neck.  She also complains of mild short-term memory difficulties.  She denies any prior history of depression or anxiety but feels that she has a underlying fear that she may develop Parkinson's disease as her mother suffered from this disease.  She has lost about 50 pounds in the last 1 year on a strict keto diet and she states she is eating healthy.  She denies any significant headaches, loss of consciousness, double vision, gait or balance difficulties.  She has had no recent lab work or brain imaging study done.      ROS:   14 system review of systems is positive for allergies and all other systems negative  PMH:  Past Medical History:  Diagnosis Date   Aneurysm of ascending aorta (HCC) 10/13/2021   Insomnia    Low back pain    Menorrhagia 03/28/2012   Migraines    Palpitations 07/02/2021   Pericardial effusion    Renal calculus     Social History:  Social History   Socioeconomic History   Marital status: Married    Spouse name: Not on file   Number of children: Not on file   Years of education: Not on file   Highest education level: Not on file  Occupational History   Not on file  Tobacco Use   Smoking status: Never   Smokeless tobacco: Never  Substance and  Sexual Activity   Alcohol use: No   Drug use: No   Sexual activity: Yes    Birth control/protection: Surgical  Other Topics Concern   Not on file  Social History Narrative   Caffeine: 1 soda/day   Social Determinants of Health   Financial Resource Strain: Not on file  Food Insecurity: Not on file  Transportation Needs: Not on file  Physical Activity: Not on file  Stress: Not on file  Social Connections: Not on file  Intimate Partner Violence: Not on file    Medications:   Current Outpatient Medications on File  Prior to Visit  Medication Sig Dispense Refill   eletriptan (RELPAX) 40 MG tablet TAKE 1 TABLETBY MOUTH AS NEEDED FOR MIGRAINE OR HEADACHE (FOR ACUTE MIGRAINE TREATMENT MAY REPEAT X1 AS NEEED >2 HRS MAX DOSE 80MG /24HR). 6 tablet 11   estradiol (ESTRACE) 0.1 MG/GM vaginal cream Place 1 Applicatorful vaginally as needed. As Needed     MAGNESIUM PO Take by mouth.     Multiple Vitamin (MULTIVITAMIN PO) Take by mouth.     QUEtiapine (SEROQUEL) 25 MG tablet Take 25 mg by mouth at bedtime.     Travoprost, BAK Free, (TRAVATAN) 0.004 % SOLN ophthalmic solution Place 1 drop into both eyes at bedtime.     Tretinoin Microsphere 0.06 % GEL Retin-A Micro Pump 0.06 % topical gel  1 APPLICATION THIN LAYER TO FACE NIGHTLY     TURMERIC PO Take by mouth.     No current facility-administered medications on file prior to visit.    Allergies:   Allergies  Allergen Reactions   No Known Allergies    Vitals: Today's Vitals   02/02/23 1305  BP: 124/88  Pulse: 82  Weight: 213 lb (96.6 kg)  Height: 5\' 9"  (1.753 m)    Body mass index is 31.45 kg/m.  Exam: NAD, pleasant                  Speech:    Speech is normal; fluent and spontaneous with normal comprehension.  Cognition:    The patient is oriented to person, place, and time;     recent and remote memory intact;     language fluent;    Cranial Nerves:    The pupils are equal, round, and reactive to light.Trigeminal  sensation is intact and the muscles of mastication are normal. The face is symmetric. The palate elevates in the midline. Hearing intact. Voice is normal. Shoulder shrug is normal. The tongue has normal motion without fasciculations.   Coordination:  No dysmetria  Motor Observation:    No asymmetry, no atrophy, and no involuntary movements noted. Tone:    Normal muscle tone.     Strength:    Strength is V/V in the upper and lower limbs.      Sensation: intact to LT       ASSESSMENT/PLAN: Dr. Pearlean Jenkins asked for [facility] to me for migraines. Appears patient has been on multiple medications and still has burden. She had side effects to topamax. Emgality stopped working. She was started on Ajovy last month and doesn't like the injections had side effects. Migraines worked well on Topiramate but she had side effects, worsening brain fog. Tried amitriptyline had side effects made her sick. Uses Relpax. Tried imitrex.  Aimovig did not work. On Ajovy she is still having > 8 moderate to severe migraine days a month and > 15 total headache days a month. No aura. No medication overuse. Will change to botox, stop Ajovy, at some point when we can get it approved she liked nurtec samples.   CC:  McClanahan, Bella Kennedy, NP    I spent over 30 minutes of face-to-face and non-face-to-face time with patient on the  1. Chronic migraine without aura without status migrainosus, not intractable    diagnosis.  This included previsit chart review, lab review, study review, order entry, electronic health record documentation, patient education on the different diagnostic and therapeutic options, counseling and coordination of care, risks and benefits of management, compliance, or risk factor reduction    Jill Heady, MD  [facility] [address] [place], [place] [address]  Phone [phone] Fax [phone] Note: This document was prepared with digital dictation and possible smart  phrase technology. Any transcriptional errors that result from this process are unintentional.

## 2023-02-02 NOTE — Patient Instructions (Signed)
Botox  OnabotulinumtoxinA Injection (Medical Use) What is this medication? ONABOTULINUMTOXINA (o na BOTT you lye num tox in eh) treats severe muscle spasms. It may also be used to prevent migraine headaches. It can treat excessive sweating when other medications do not work well enough. This medicine may be used for other purposes; ask your health care provider or pharmacist if you have questions. COMMON BRAND NAME(S): Botox What should I tell my care team before I take this medication? They need to know if you have any of these conditions: Breathing problems Cerebral palsy spasms Difficulty urinating Heart problems History of surgery where this medication is going to be used Infection at the site where this medication is going to be used Myasthenia gravis or other neurologic disease Nerve or muscle disease Surgery plans Take medications that treat or prevent blood clots Thyroid problems An unusual or allergic reaction to botulinum toxin, albumin, other medications, foods, dyes, or preservatives Pregnant or trying to get pregnant Breast-feeding How should I use this medication? This medication is for injected into a muscle. It is given by your care team in a hospital or clinic setting. A special MedGuide will be given to you before each treatment. Be sure to read this information carefully each time. Talk to your care team about the use of this medication in children. While this medication may be prescribed for children as young as 2 years for selected conditions, precautions do apply. Overdosage: If you think you have taken too much of this medicine contact a poison control center or emergency room at once. NOTE: This medicine is only for you. Do not share this medicine with others. What if I miss a dose? This does not apply. What may interact with this medication? Aminoglycoside antibiotics, such as gentamicin, neomycin, tobramycin Muscle relaxants Other botulinum toxin  injections This list may not describe all possible interactions. Give your health care provider a list of all the medicines, herbs, non-prescription drugs, or dietary supplements you use. Also tell them if you smoke, drink alcohol, or use illegal drugs. Some items may interact with your medicine. What should I watch for while using this medication? Visit your care team for regular check ups. This medication will cause weakness in the muscle where it is injected. Tell your care team if you feel unusually weak in other muscles. Get medical help right away if you have problems with breathing, swallowing, or talking. This medication might make your eyelids droop or make you see blurry or double. If you have weak muscles or trouble seeing do not drive a car, use machinery, or do other dangerous activities. This medication contains albumin from human blood. It may be possible to pass an infection in this medication, but no cases have been reported. Talk to your care team about the risks and benefits of this medication. If your activities have been limited by your condition, go back to your regular routine slowly after treatment with this medication. What side effects may I notice from receiving this medication? Side effects that you should report to your care team as soon as possible: Allergic reactions--skin rash, itching, hives, swelling of the face, lips, tongue, or throat Dryness or irritation of the eyes, eye pain, change in vision, sensitivity to light Infection--fever, chills, cough, sore throat, wounds that don't heal, pain or trouble when passing urine, general feeling of discomfort or being unwell Spread of botulinum toxin effects--unusual weakness or fatigue, blurry or double vision, trouble swallowing, hoarseness or trouble speaking, trouble breathing, loss  of bladder control Trouble passing urine Side effects that usually do not require medical attention (report these to your care team if they  continue or are bothersome): Dry mouth Eyelid drooping Fatigue Headache Pain, redness, or irritation at injection site This list may not describe all possible side effects. Call your doctor for medical advice about side effects. You may report side effects to FDA at 1-800-FDA-1088. Where should I keep my medication? This medication is given in a hospital or clinic and will not be stored at home. NOTE: This sheet is a summary. It may not cover all possible information. If you have questions about this medicine, talk to your doctor, pharmacist, or health care provider.  2023 Elsevier/Gold Standard (2021-08-28 00:00:00)

## 2023-02-04 ENCOUNTER — Telehealth: Payer: Self-pay

## 2023-02-04 ENCOUNTER — Other Ambulatory Visit (HOSPITAL_COMMUNITY): Payer: Self-pay

## 2023-02-04 DIAGNOSIS — G43709 Chronic migraine without aura, not intractable, without status migrainosus: Secondary | ICD-10-CM

## 2023-02-04 NOTE — Telephone Encounter (Signed)
Pharmacy Patient Advocate Encounter   Received notification from [facility] that prior authorization for Botox 200 Units is required/requested.   PA submitted on 02/04/2023 to (ins) [employer] via CoverMyMeds Key or (Medicaid) confirmation # [id number] Status is pending

## 2023-02-04 NOTE — Telephone Encounter (Signed)
A new telephone call encounter has been made for this PA Request-please see telephone note dated ...02/04/2023 

## 2023-02-04 NOTE — Telephone Encounter (Signed)
   Benefit Verification [id number] Submitted!

## 2023-02-04 NOTE — Telephone Encounter (Addendum)
Error

## 2023-02-09 ENCOUNTER — Telehealth: Payer: Self-pay | Admitting: Neurology

## 2023-02-09 NOTE — Telephone Encounter (Signed)
Spoke with Dr Teresa Coombs who is covering for Dr Lucia Gaskins this afternoon. He advised to provide the sample Ajovy for the patient. I spoke with the patient and let her know. She will come in tomorrow AM for Ajovy 225 mg injection by RN. Emgality has been canceled at the pharmacy.

## 2023-02-09 NOTE — Telephone Encounter (Signed)
I spoke with the patient.  The Ajovy has been sitting out approximately the 13th of patient is aware that at this point she will need to discard and not use the injection.  The patient says she can either pick up the [facility] that she has ready had another pharmacy (leftover from previous prescription) today or if we have a sample of Ajovy she could have then she would be ok with that.  She only had 1 Ajovy so far, approximately 28 days ago, and does not feel that she has had significant benefit but she is waiting on starting Botox as soon as it is approved.

## 2023-02-09 NOTE — Telephone Encounter (Signed)
Pt called needing to discuss with RN how she left the Ajovy out because she did not realize it needed to be refrigerated. Please advise.

## 2023-02-10 ENCOUNTER — Ambulatory Visit (INDEPENDENT_AMBULATORY_CARE_PROVIDER_SITE_OTHER): Payer: BC Managed Care – PPO | Admitting: *Deleted

## 2023-02-10 ENCOUNTER — Other Ambulatory Visit: Payer: Self-pay

## 2023-02-10 ENCOUNTER — Other Ambulatory Visit (HOSPITAL_COMMUNITY): Payer: Self-pay

## 2023-02-10 DIAGNOSIS — Z7689 Persons encountering health services in other specified circumstances: Secondary | ICD-10-CM

## 2023-02-10 DIAGNOSIS — G43709 Chronic migraine without aura, not intractable, without status migrainosus: Secondary | ICD-10-CM

## 2023-02-10 MED ORDER — ONABOTULINUMTOXINA 200 UNITS IJ SOLR
INTRAMUSCULAR | 3 refills | Status: AC
Start: 2023-02-10 — End: ?
  Filled 2023-02-10: qty 1, 90d supply, fill #0
  Filled 2023-03-04: qty 1, 84d supply, fill #0
  Filled 2023-06-01: qty 1, 84d supply, fill #1
  Filled 2023-08-30: qty 1, 84d supply, fill #2
  Filled 2023-12-02: qty 1, 84d supply, fill #3

## 2023-02-10 MED ORDER — AJOVY 225 MG/1.5ML ~~LOC~~ SOAJ: 225.0000 mg | SUBCUTANEOUS | 0 refills | Status: DC

## 2023-02-10 NOTE — Telephone Encounter (Signed)
She would have to stay with [name] since she's already established with her. Her first opening would be 03/23/23, is that okay? A little over a month out.

## 2023-02-10 NOTE — Telephone Encounter (Signed)
Called pt and got her scheduled for 03/23/23 at 9:45 am with [name], NP. Informed her of process with specialty pharmacy as well as copay card information.

## 2023-02-10 NOTE — Progress Notes (Signed)
Pt arrived for administration of Ajovy 225 mg SQ sample x 1 per v.o. Dr Teresa Coombs. Injection given into back of R upper arm. Pt tolerated well. Latex free bandaid applied. Sample order documented in chart. If patient proceeds with further injections, next will be due in 30 days. Patient is also awaiting Botox start. Will seek update from PA team.

## 2023-02-10 NOTE — Telephone Encounter (Signed)
Pharmacy Patient Advocate Encounter- Botox BIV-Pharmacy Benefit:  PA was submitted to [employer] and has been approved through: 02/08/2024 Authorization# PA Case ID #: [id number]  Please send prescription to Specialty Pharmacy: [facility]: [phone]  Estimated Copay is: $238.13 per [facility] test claim.  Patient IS eligible for Botox Copay Card, which will make patient's copay as little as zero. Copay card will be provided to pharmacy.

## 2023-02-10 NOTE — Telephone Encounter (Signed)
Yes that should be fine because she had some Ajovy to hold her over and she just took a dose today. Can you place her on the wait list for [name]?

## 2023-02-10 NOTE — Addendum Note (Signed)
Addended by: Bertram Savin on: 02/10/2023 10:13 AM   Modules accepted: Orders

## 2023-02-10 NOTE — Telephone Encounter (Signed)
Botox 200 unit Rx sent to [facility]. Please schedule patient with any NP as early as next week (if you don't see anything in the next month let me know). Let patient know the PA team signed her up for the Botox savings program which will reimburse her up to $1000 for out of pocket costs each injection. The medication copay is $238.13 but with the savings card (which will automatically be applied) it may be as little as $0. She can then submit her office visit cost to the program for reimbursement after she gets/pays her bill.

## 2023-02-24 ENCOUNTER — Telehealth: Payer: Self-pay | Admitting: Neurology

## 2023-02-24 NOTE — Telephone Encounter (Signed)
Called and stated that she can can take the ajovy and she voiced gratitude and understanding.

## 2023-02-24 NOTE — Telephone Encounter (Signed)
Pt call back. Requesting a return call from nurse.

## 2023-02-24 NOTE — Telephone Encounter (Signed)
Called and spoke to patient who reports having waves of migraines since Sunday, she reports she started having blurred vision, nausea, and ear ringing which she says she did not have while on topamax. She has taken nurtec two times on two different days and it would give a couple ours of relief and it would be back. She states she is coming next month for the start of botox, so she hasn't taken the ajovy shot for the month of June. She would like recommendations on what to do in terms of the shot and what to do in terms of getting rid of this migraine.

## 2023-02-24 NOTE — Telephone Encounter (Signed)
Relayed recommendations from dr. Lucia Gaskins. Pt voiced gratitude and understanding. She did inquire if she should take ajovy prior to botox injection since she has yet to do one for the month of June.

## 2023-02-24 NOTE — Telephone Encounter (Signed)
I am not sure what we can do, she has tried and failed a plethora of medications. We have to wait for botox. Urgent care or ED especially if she is having new symptoms.

## 2023-02-24 NOTE — Telephone Encounter (Signed)
1st attempt left msg to call back .

## 2023-02-24 NOTE — Telephone Encounter (Signed)
Pt has had a migraine since Sunday Morning and no medication has been able to take care of the pain for a long period of time. Pt would like to be advised on what she can do next.

## 2023-02-24 NOTE — Telephone Encounter (Signed)
She can take her ajovy thanks

## 2023-03-04 ENCOUNTER — Other Ambulatory Visit (HOSPITAL_COMMUNITY): Payer: Self-pay

## 2023-03-05 ENCOUNTER — Other Ambulatory Visit: Payer: Self-pay

## 2023-03-11 ENCOUNTER — Other Ambulatory Visit: Payer: Self-pay

## 2023-03-23 ENCOUNTER — Ambulatory Visit (INDEPENDENT_AMBULATORY_CARE_PROVIDER_SITE_OTHER): Payer: BC Managed Care – PPO | Admitting: Adult Health

## 2023-03-23 DIAGNOSIS — G43009 Migraine without aura, not intractable, without status migrainosus: Secondary | ICD-10-CM

## 2023-03-23 DIAGNOSIS — G43709 Chronic migraine without aura, not intractable, without status migrainosus: Secondary | ICD-10-CM

## 2023-03-23 MED ORDER — NURTEC 75 MG PO TBDP: 75.0000 mg | ORAL_TABLET | ORAL | 5 refills | Status: DC | PRN

## 2023-03-23 MED ORDER — ONABOTULINUMTOXINA 200 UNITS IJ SOLR
155.0000 [IU] | Freq: Once | INTRAMUSCULAR | Status: AC
Start: 2023-03-23 — End: 2023-03-23
  Administered 2023-03-23: 155 [IU] via INTRAMUSCULAR

## 2023-03-23 NOTE — Addendum Note (Signed)
Addended by: Ihor Austin L on: 03/23/2023 10:37 AM   Modules accepted: Orders

## 2023-03-23 NOTE — Progress Notes (Signed)
Patient is being seen for initial Botox injection.  Currently, reports >15 migraine days per month. Since stopping topamax, she has noticed having more auras associated with her migraines (blurred vision, nausea and ear ringing). She has continued on Ajovy. Use of Nurtec usually with benefit, she is requesting a prescription. She also questions use of continuation of Relpax. She was advised she can take Relpax if Nurtec does not provide relief but to not take together. Nurtec prescription provided. She tolerated procedure well today. Will return in 3 months.     Consent Form Botulism Toxin Injection For Chronic Migraine    Reviewed orally with patient, additionally signature is on file:  Botulism toxin has been approved by the Federal drug administration for treatment of chronic migraine. Botulism toxin does not cure chronic migraine and it may not be effective in some patients.  The administration of botulism toxin is accomplished by injecting a small amount of toxin into the muscles of the neck and head. Dosage must be titrated for each individual. Any benefits resulting from botulism toxin tend to wear off after 3 months with a repeat injection required if benefit is to be maintained. Injections are usually done every 3-4 months with maximum effect peak achieved by about 2 or 3 weeks. Botulism toxin is expensive and you should be sure of what costs you will incur resulting from the injection.  The side effects of botulism toxin use for chronic migraine may include:   -Transient, and usually mild, facial weakness with facial injections  -Transient, and usually mild, head or neck weakness with head/neck injections  -Reduction or loss of forehead facial animation due to forehead muscle weakness  -Eyelid drooping  -Dry eye  -Pain at the site of injection or bruising at the site of injection  -Double vision  -Potential unknown long term risks   Contraindications: You should not have  Botox if you are pregnant, nursing, allergic to albumin, have an infection, skin condition, or muscle weakness at the site of the injection, or have myasthenia gravis, Lambert-Eaton syndrome, or ALS.  It is also possible that as with any injection, there may be an allergic reaction or no effect from the medication. Reduced effectiveness after repeated injections is sometimes seen and rarely infection at the injection site may occur. All care will be taken to prevent these side effects. If therapy is given over a long time, atrophy and wasting in the muscle injected may occur. Occasionally the patient's become refractory to treatment because they develop antibodies to the toxin. In this event, therapy needs to be modified.  I have read the above information and consent to the administration of botulism toxin.    BOTOX PROCEDURE NOTE FOR MIGRAINE HEADACHE  Contraindications and precautions discussed with patient(above). Aseptic procedure was observed and patient tolerated procedure. Procedure performed by Ihor Austin, AGNP-BC.   The condition has existed for more than 6 months, and pt does not have a diagnosis of ALS, Myasthenia Gravis or Lambert-Eaton Syndrome.  Risks and benefits of injections discussed and pt agrees to proceed with the procedure.  Written consent obtained  These injections are medically necessary. Pt  receives good benefits from these injections. These injections do not cause sedations or hallucinations which the oral therapies may cause.   Description of procedure:  The patient was placed in a sitting position. The standard protocol was used for Botox as follows, with 5 units of Botox injected at each site:  -Procerus muscle, midline injection  -  Corrugator muscle, bilateral injection  -Frontalis muscle, bilateral injection, with 2 sites each side, medial injection was performed in the upper one third of the frontalis muscle, in the region vertical from the medial  inferior edge of the superior orbital rim. The lateral injection was again in the upper one third of the forehead vertically above the lateral limbus of the cornea, 1.5 cm lateral to the medial injection site.  -Temporalis muscle injection, 4 sites, bilaterally. The first injection was 3 cm above the tragus of the ear, second injection site was 1.5 cm to 3 cm up from the first injection site in line with the tragus of the ear. The third injection site was 1.5-3 cm forward between the first 2 injection sites. The fourth injection site was 1.5 cm posterior to the second injection site. 5th site laterally in the temporalis  muscleat the level of the outer canthus.  -Occipitalis muscle injection, 3 sites, bilaterally. The first injection was done one half way between the occipital protuberance and the tip of the mastoid process behind the ear. The second injection site was done lateral and superior to the first, 1 fingerbreadth from the first injection. The third injection site was 1 fingerbreadth superiorly and medially from the first injection site.  -Cervical paraspinal muscle injection, 2 sites, bilaterally. The first injection site was 1 cm from the midline of the cervical spine, 3 cm inferior to the lower border of the occipital protuberance. The second injection site was 1.5 cm superiorly and laterally to the first injection site.  -Trapezius muscle injection was performed at 3 sites, bilaterally. The first injection site was in the upper trapezius muscle halfway between the inflection point of the neck, and the acromion. The second injection site was one half way between the acromion and the first injection site. The third injection was done between the first injection site and the inflection point of the neck.    A total of 200 units of Botox was prepared, 155 units of Botox was injected as documented above, any Botox not injected was wasted. The patient tolerated the procedure well, there were no  complications of the above procedure.   Ihor Austin, AGNP-BC  [facility] [address] [place], [place] [address]  Phone [phone] Fax [phone] Note: This document was prepared with digital dictation and possible smart phrase technology. Any transcriptional errors that result from this process are unintentional.

## 2023-03-23 NOTE — Progress Notes (Signed)
Botox- 200 units x 1 vial Lot: [id number] Expiration: 03/2025 NDC: 5409-8119-14  Bacteriostatic 0.9% Sodium Chloride- * mL  Lot: [id number] Expiration: 07/15/2024 NDC: 2130-8657-84  Dx: O96.295 S/P Witnessed by Geronimo Running

## 2023-04-08 ENCOUNTER — Telehealth: Payer: Self-pay

## 2023-04-08 ENCOUNTER — Other Ambulatory Visit (HOSPITAL_COMMUNITY): Payer: Self-pay

## 2023-04-08 NOTE — Telephone Encounter (Signed)
Pharmacy Patient Advocate Encounter  Received notification from [facility] that Prior Authorization for Nurtec 75MG  dispersible tablets has been APPROVED from 03/09/2023 to 04/07/2024. Ran test claim, Copay is $0 per 30DS/ 16 Tablets.  PA #/Case ID/Reference #: PA Case ID: [id number]  Key: [id number]

## 2023-05-01 LAB — COLOGUARD: COLOGUARD: NEGATIVE

## 2023-05-01 LAB — EXTERNAL GENERIC LAB PROCEDURE: COLOGUARD: NEGATIVE

## 2023-06-01 ENCOUNTER — Other Ambulatory Visit (HOSPITAL_COMMUNITY): Payer: Self-pay

## 2023-06-17 ENCOUNTER — Ambulatory Visit (INDEPENDENT_AMBULATORY_CARE_PROVIDER_SITE_OTHER): Payer: BC Managed Care – PPO | Admitting: Adult Health

## 2023-06-17 DIAGNOSIS — G43709 Chronic migraine without aura, not intractable, without status migrainosus: Secondary | ICD-10-CM

## 2023-06-17 DIAGNOSIS — G43009 Migraine without aura, not intractable, without status migrainosus: Secondary | ICD-10-CM

## 2023-06-17 MED ORDER — ONABOTULINUMTOXINA 200 UNITS IJ SOLR
155.0000 [IU] | Freq: Once | INTRAMUSCULAR | Status: AC
Start: 2023-06-17 — End: 2023-06-17
  Administered 2023-06-17: 155 [IU] via INTRAMUSCULAR

## 2023-06-17 NOTE — Progress Notes (Signed)
Returns for second Botox injection.  Prior injection 03/23/2023, tolerated well.  Reports significant improvement of migraine headaches, prior to botox reported >15 migraine days per month, now has only had 2-3 over the past 3 months!!  Continues on Ajovy monthly injection.  Use of Relpax for rescue usually with benefit, never received rx for Nurtec although this was approved by insurance back in June, she was encouraged to contact her pharmacy to discuss.  Tolerated procedure well today, will return in 3 months for repeat injection.      Consent Form Botulism Toxin Injection For Chronic Migraine    Reviewed orally with patient, additionally signature is on file:  Botulism toxin has been approved by the Federal drug administration for treatment of chronic migraine. Botulism toxin does not cure chronic migraine and it may not be effective in some patients.  The administration of botulism toxin is accomplished by injecting a small amount of toxin into the muscles of the neck and head. Dosage must be titrated for each individual. Any benefits resulting from botulism toxin tend to wear off after 3 months with a repeat injection required if benefit is to be maintained. Injections are usually done every 3-4 months with maximum effect peak achieved by about 2 or 3 weeks. Botulism toxin is expensive and you should be sure of what costs you will incur resulting from the injection.  The side effects of botulism toxin use for chronic migraine may include:   -Transient, and usually mild, facial weakness with facial injections  -Transient, and usually mild, head or neck weakness with head/neck injections  -Reduction or loss of forehead facial animation due to forehead muscle weakness  -Eyelid drooping  -Dry eye  -Pain at the site of injection or bruising at the site of injection  -Double vision  -Potential unknown long term risks   Contraindications: You should not have Botox if you are  pregnant, nursing, allergic to albumin, have an infection, skin condition, or muscle weakness at the site of the injection, or have myasthenia gravis, Lambert-Eaton syndrome, or ALS.  It is also possible that as with any injection, there may be an allergic reaction or no effect from the medication. Reduced effectiveness after repeated injections is sometimes seen and rarely infection at the injection site may occur. All care will be taken to prevent these side effects. If therapy is given over a long time, atrophy and wasting in the muscle injected may occur. Occasionally the patient's become refractory to treatment because they develop antibodies to the toxin. In this event, therapy needs to be modified.  I have read the above information and consent to the administration of botulism toxin.    BOTOX PROCEDURE NOTE FOR MIGRAINE HEADACHE  Contraindications and precautions discussed with patient(above). Aseptic procedure was observed and patient tolerated procedure. Procedure performed by Ihor Austin, AGNP-BC.   The condition has existed for more than 6 months, and pt does not have a diagnosis of ALS, Myasthenia Gravis or Lambert-Eaton Syndrome.  Risks and benefits of injections discussed and pt agrees to proceed with the procedure.  Written consent obtained  These injections are medically necessary. Pt  receives good benefits from these injections. These injections do not cause sedations or hallucinations which the oral therapies may cause.   Description of procedure:  The patient was placed in a sitting position. The standard protocol was used for Botox as follows, with 5 units of Botox injected at each site:  -Procerus muscle, midline injection  -  Corrugator muscle, bilateral injection  -Frontalis muscle, bilateral injection, with 2 sites each side, medial injection was performed in the upper one third of the frontalis muscle, in the region vertical from the medial inferior edge of the  superior orbital rim. The lateral injection was again in the upper one third of the forehead vertically above the lateral limbus of the cornea, 1.5 cm lateral to the medial injection site.  -Temporalis muscle injection, 4 sites, bilaterally. The first injection was 3 cm above the tragus of the ear, second injection site was 1.5 cm to 3 cm up from the first injection site in line with the tragus of the ear. The third injection site was 1.5-3 cm forward between the first 2 injection sites. The fourth injection site was 1.5 cm posterior to the second injection site. 5th site laterally in the temporalis  muscleat the level of the outer canthus.  -Occipitalis muscle injection, 3 sites, bilaterally. The first injection was done one half way between the occipital protuberance and the tip of the mastoid process behind the ear. The second injection site was done lateral and superior to the first, 1 fingerbreadth from the first injection. The third injection site was 1 fingerbreadth superiorly and medially from the first injection site.  -Cervical paraspinal muscle injection, 2 sites, bilaterally. The first injection site was 1 cm from the midline of the cervical spine, 3 cm inferior to the lower border of the occipital protuberance. The second injection site was 1.5 cm superiorly and laterally to the first injection site.  -Trapezius muscle injection was performed at 3 sites, bilaterally. The first injection site was in the upper trapezius muscle halfway between the inflection point of the neck, and the acromion. The second injection site was one half way between the acromion and the first injection site. The third injection was done between the first injection site and the inflection point of the neck.    A total of 200 units of Botox was prepared, 155 units of Botox was injected as documented above, any Botox not injected was wasted. The patient tolerated the procedure well, there were no complications of the  above procedure.   Ihor Austin, AGNP-BC  [facility] [address] [place], [place] [address]  Phone [phone] Fax [phone] Note: This document was prepared with digital dictation and possible smart phrase technology. Any transcriptional errors that result from this process are unintentional.

## 2023-06-17 NOTE — Progress Notes (Signed)
Botox- 200 units x 1 vial Lot: [id number] Expiration: 08/2025 NDC: 5409-8119-14  Bacteriostatic 0.9% Sodium Chloride- * mL  Lot: [id number] Expiration: 12/14/2023 NDC: 2130-8657-84  Dx: 43.709 S/P Witnessed by Marlana Latus, RN

## 2023-07-13 ENCOUNTER — Telehealth: Payer: Self-pay | Admitting: Cardiovascular Disease

## 2023-07-13 NOTE — Telephone Encounter (Signed)
Pt is requesting a callback from nurse regarding her scheduling her MR CARDIAC MORPHOLOGY W WO CONTRAST to be done. Please advise

## 2023-07-13 NOTE — Telephone Encounter (Signed)
Returned call to patient. Made aware that CT will call to schedule.

## 2023-07-21 ENCOUNTER — Other Ambulatory Visit: Payer: Self-pay

## 2023-08-25 ENCOUNTER — Telehealth (HOSPITAL_BASED_OUTPATIENT_CLINIC_OR_DEPARTMENT_OTHER): Payer: Self-pay

## 2023-08-25 NOTE — Telephone Encounter (Signed)
-----   Message from Nurse Lendon Collar sent at 05/07/2023 10:00 AM EDT ----- NEEDS CARDIAC MRI IN [place] 2025

## 2023-08-25 NOTE — Telephone Encounter (Signed)
Disregard encounter. Cardiac MRI scheduled for Jan 2025.

## 2023-08-30 ENCOUNTER — Other Ambulatory Visit: Payer: Self-pay

## 2023-08-30 MED ORDER — AJOVY 225 MG/1.5ML ~~LOC~~ SOAJ: 225.0000 mg | SUBCUTANEOUS | Status: DC

## 2023-08-30 NOTE — Progress Notes (Signed)
Specialty Pharmacy Refill Coordination Note  Jill Jenkins is a 57 y.o. female contacted today regarding refills of specialty medication(s) OnabotulinumtoxinA (BOTOX)   Patient requested Courier to Provider Office   Delivery date: 09/02/23   Verified address: [facility]   Medication will be filled on 09/01/23.

## 2023-09-01 ENCOUNTER — Other Ambulatory Visit: Payer: Self-pay

## 2023-09-06 ENCOUNTER — Other Ambulatory Visit: Payer: Self-pay | Admitting: Anesthesiology

## 2023-09-06 MED ORDER — AJOVY 225 MG/1.5ML ~~LOC~~ SOAJ: 225.0000 mg | SUBCUTANEOUS | 3 refills | Status: DC

## 2023-09-17 ENCOUNTER — Telehealth (HOSPITAL_COMMUNITY): Payer: Self-pay | Admitting: Emergency Medicine

## 2023-09-20 ENCOUNTER — Other Ambulatory Visit (HOSPITAL_BASED_OUTPATIENT_CLINIC_OR_DEPARTMENT_OTHER): Payer: Self-pay | Admitting: Cardiovascular Disease

## 2023-09-20 ENCOUNTER — Ambulatory Visit (HOSPITAL_COMMUNITY)
Admission: RE | Admit: 2023-09-20 | Discharge: 2023-09-20 | Disposition: A | Discharge status: Home / Self Care | Payer: BC Managed Care – PPO | Source: Ambulatory Visit | Attending: Cardiovascular Disease | Admitting: Cardiovascular Disease

## 2023-09-20 DIAGNOSIS — Q248 Other specified congenital malformations of heart: Secondary | ICD-10-CM | POA: Diagnosis present

## 2023-09-20 DIAGNOSIS — I7121 Aneurysm of the ascending aorta, without rupture: Secondary | ICD-10-CM

## 2023-09-20 MED ORDER — GADOBUTROL 1 MMOL/ML IV SOLN
10.0000 mL | Freq: Once | INTRAVENOUS | Status: AC | PRN
  Administered 2023-09-20: 10 mL via INTRAVENOUS

## 2023-09-22 ENCOUNTER — Ambulatory Visit (INDEPENDENT_AMBULATORY_CARE_PROVIDER_SITE_OTHER): Payer: BC Managed Care – PPO | Admitting: Adult Health

## 2023-09-22 DIAGNOSIS — G43709 Chronic migraine without aura, not intractable, without status migrainosus: Secondary | ICD-10-CM

## 2023-09-22 MED ORDER — AJOVY 225 MG/1.5ML ~~LOC~~ SOAJ: 225.0000 mg | SUBCUTANEOUS | 11 refills | Status: DC

## 2023-09-22 MED ORDER — ONABOTULINUMTOXINA 200 UNITS IJ SOLR
155.0000 [IU] | Freq: Once | INTRAMUSCULAR | Status: AC
  Administered 2023-09-22: 155 [IU] via INTRAMUSCULAR

## 2023-09-22 MED ORDER — ELETRIPTAN HYDROBROMIDE 40 MG PO TABS: ORAL_TABLET | ORAL | 11 refills | Status: AC

## 2023-09-22 NOTE — Progress Notes (Signed)
 Returns for repeat Botox  injection.  Prior injection 06/17/2023, tolerated well.  Migraines continue to be well-controlled on Botox , had 1 migraine headache this past weekend but no migraines over the past couple of months. Continue on Ajovy  injection. Use of Relpax  with benefit.  Tolerated procedure well today.  Return in 3 months for repeat injection.     Consent Form Botulism Toxin Injection For Chronic Migraine    Reviewed orally with patient, additionally signature is on file:  Botulism toxin has been approved by the Federal drug administration for treatment of chronic migraine. Botulism toxin does not cure chronic migraine and it may not be effective in some patients.  The administration of botulism toxin is accomplished by injecting a small amount of toxin into the muscles of the neck and head. Dosage must be titrated for each individual. Any benefits resulting from botulism toxin tend to wear off after 3 months with a repeat injection required if benefit is to be maintained. Injections are usually done every 3-4 months with maximum effect peak achieved by about 2 or 3 weeks. Botulism toxin is expensive and you should be sure of what costs you will incur resulting from the injection.  The side effects of botulism toxin use for chronic migraine may include:   -Transient, and usually mild, facial weakness with facial injections  -Transient, and usually mild, head or neck weakness with head/neck injections  -Reduction or loss of forehead facial animation due to forehead muscle weakness  -Eyelid drooping  -Dry eye  -Pain at the site of injection or bruising at the site of injection  -Double vision  -Potential unknown long term risks   Contraindications: You should not have Botox  if you are pregnant, nursing, allergic to albumin, have an infection, skin condition, or muscle weakness at the site of the injection, or have myasthenia gravis, Lambert-Eaton syndrome, or ALS.  It is  also possible that as with any injection, there may be an allergic reaction or no effect from the medication. Reduced effectiveness after repeated injections is sometimes seen and rarely infection at the injection site may occur. All care will be taken to prevent these side effects. If therapy is given over a long time, atrophy and wasting in the muscle injected may occur. Occasionally the patient's become refractory to treatment because they develop antibodies to the toxin. In this event, therapy needs to be modified.  I have read the above information and consent to the administration of botulism toxin.    BOTOX  PROCEDURE NOTE FOR MIGRAINE HEADACHE  Contraindications and precautions discussed with patient(above). Aseptic procedure was observed and patient tolerated procedure. Procedure performed by Harlene Bogaert, AGNP-BC.   The condition has existed for more than 6 months, and pt does not have a diagnosis of ALS, Myasthenia Gravis or Lambert-Eaton Syndrome.  Risks and benefits of injections discussed and pt agrees to proceed with the procedure.  Written consent obtained  These injections are medically necessary. Pt  receives good benefits from these injections. These injections do not cause sedations or hallucinations which the oral therapies may cause.   Description of procedure:  The patient was placed in a sitting position. The standard protocol was used for Botox  as follows, with 5 units of Botox  injected at each site:  -Procerus muscle, midline injection  -Corrugator muscle, bilateral injection  -Frontalis muscle, bilateral injection, with 2 sites each side, medial injection was performed in the upper one third of the frontalis muscle, in the region vertical from  the medial inferior edge of the superior orbital rim. The lateral injection was again in the upper one third of the forehead vertically above the lateral limbus of the cornea, 1.5 cm lateral to the medial injection  site.  -Temporalis muscle injection, 4 sites, bilaterally. The first injection was 3 cm above the tragus of the ear, second injection site was 1.5 cm to 3 cm up from the first injection site in line with the tragus of the ear. The third injection site was 1.5-3 cm forward between the first 2 injection sites. The fourth injection site was 1.5 cm posterior to the second injection site. 5th site laterally in the temporalis  muscleat the level of the outer canthus.  -Occipitalis muscle injection, 3 sites, bilaterally. The first injection was done one half way between the occipital protuberance and the tip of the mastoid process behind the ear. The second injection site was done lateral and superior to the first, 1 fingerbreadth from the first injection. The third injection site was 1 fingerbreadth superiorly and medially from the first injection site.  -Cervical paraspinal muscle injection, 2 sites, bilaterally. The first injection site was 1 cm from the midline of the cervical spine, 3 cm inferior to the lower border of the occipital protuberance. The second injection site was 1.5 cm superiorly and laterally to the first injection site.  -Trapezius muscle injection was performed at 3 sites, bilaterally. The first injection site was in the upper trapezius muscle halfway between the inflection point of the neck, and the acromion. The second injection site was one half way between the acromion and the first injection site. The third injection was done between the first injection site and the inflection point of the neck.    A total of 200 units of Botox  was prepared, 155 units of Botox  was injected as documented above, any Botox  not injected was wasted. The patient tolerated the procedure well, there were no complications of the above procedure.   Harlene Bogaert, AGNP-BC  [facility] [address] [place], [place] [address]  Phone [phone] Fax [phone] Note:  This document was prepared with digital dictation and possible smart phrase technology. Any transcriptional errors that result from this process are unintentional.

## 2023-09-22 NOTE — Progress Notes (Signed)
 Botox- 200 units x 1 vial Lot: [id number] Expiration: 11/2025 NDC: 8657-8469-62  Bacteriostatic 0.9% Sodium Chloride- 4 m L  Lot: [id number] Expiration: 07/2024 NDC: 3244-0102-72   Dx: Z36.644 S/P  Witnessed by : Sharrie Rothman

## 2023-09-30 ENCOUNTER — Other Ambulatory Visit (HOSPITAL_COMMUNITY): Payer: Self-pay

## 2023-09-30 ENCOUNTER — Telehealth: Payer: Self-pay

## 2023-09-30 NOTE — Telephone Encounter (Signed)
Pharmacy Patient Advocate Encounter   Received notification from Fax that prior authorization for Ajovy 225mg /1.93ml is required/requested.   Insurance verification completed.   The patient is insured through [facility] .   Per test claim: Refill too soon. PA is not needed at this time. Medication was filled 09/23/2023. Next eligible fill date is 11/29/2023.

## 2023-11-08 ENCOUNTER — Encounter (HOSPITAL_BASED_OUTPATIENT_CLINIC_OR_DEPARTMENT_OTHER): Payer: Self-pay | Admitting: Family

## 2023-11-08 ENCOUNTER — Ambulatory Visit (INDEPENDENT_AMBULATORY_CARE_PROVIDER_SITE_OTHER): Payer: BC Managed Care – PPO | Admitting: Family

## 2023-11-08 VITALS — BP 128/74 | HR 74 | Ht 69.0 in | Wt 175.8 lb

## 2023-11-08 DIAGNOSIS — I7121 Aneurysm of the ascending aorta, without rupture: Secondary | ICD-10-CM

## 2023-11-08 DIAGNOSIS — Q248 Other specified congenital malformations of heart: Secondary | ICD-10-CM

## 2023-11-08 DIAGNOSIS — Z8679 Personal history of other diseases of the circulatory system: Secondary | ICD-10-CM

## 2023-11-08 NOTE — Patient Instructions (Addendum)
 Medication Instructions:  Continue your current medications.   *If you need a refill on your cardiac medications before your next appointment, please call your pharmacy*  Testing/Procedures: Your physician has requested that you have an echocardiogram in January 2026. Echocardiography is a painless test that uses sound waves to create images of your heart. It provides your doctor with information about the size and shape of your heart and how well your heart's chambers and valves are working. This procedure takes approximately one hour. There are no restrictions for this procedure. Please do NOT wear cologne, perfume, aftershave, or lotions (deodorant is allowed). Please arrive 15 minutes prior to your appointment time.  Please note: We ask at that you not bring children with you during ultrasound (echo/ vascular) testing. Due to room size and safety concerns, children are not allowed in the ultrasound rooms during exams. Our front office staff cannot provide observation of children in our lobby area while testing is being conducted. An adult accompanying a patient to their appointment will only be allowed in the ultrasound room at the discretion of the ultrasound technician under special circumstances. We apologize for any inconvenience.    Follow-Up: At [facility], you and your health needs are our priority.  As part of our continuing mission to provide you with exceptional heart care, we have created designated Provider Care Teams.  These Care Teams include your primary Cardiologist (physician) and Advanced Practice Providers (APPs -  Physician Assistants and Nurse Practitioners) who all work together to provide you with the care you need, when you need it.  We recommend signing up for the patient portal called "MyChart".  Sign up information is provided on this After Visit Summary.  MyChart is used to connect with patients for Virtual Visits (Telemedicine).  Patients are able to view  lab/test results, encounter notes, upcoming appointments, etc.  Non-urgent messages can be sent to your provider as well.   To learn more about what you can do with MyChart, go to [email or web address].    Your next appointment:   1 year(s)  Provider:   Chilton Si, MD, Eligha Bridegroom, NP, or Gillian Shields, NP    Other Instructions  Information About Your Aneurysm  One of your tests has shown an aneurysm of your ascending aorta. It was stable by cardiac MRI 09/2023.Marland Kitchen The word "aneurysm" refers to a bulge in an artery (blood vessel). Most people think of them in the context of an emergency, but yours was found incidentally. At this point there is nothing you need to do from a procedure standpoint, but there are some important things to keep in mind for day-to-day life.  Mainstays of therapy for aneurysms include very good blood pressure control, healthy lifestyle, and avoiding tobacco products and street drugs. Research has raised concern that antibiotics in the fluoroquinolone class could be associated with increased risk of having an aneurysm develop or tear. This includes medicines that end in "floxacin," like Cipro or Levaquin. Make sure to discuss this information with other healthcare providers if you require antibiotics.  Since aneurysms can run in families, you should discuss your diagnosis with first degree relatives as they may need to be screened for this. Regular mild-moderate physical exercise is important, but avoid heavy lifting/weight lifting over 30lbs, chopping wood, shoveling snow or digging heavy earth with a shovel. It is best to avoid activities that cause grunting or straining (medically referred to as a "Valsalva maneuver"). This happens when a person bears down against a closed  throat to increase the strength of arm or abdominal muscles. There's often a tendency to do this when lifting heavy weights, doing sit-ups, push-ups or chin-ups, etc., but it may be  harmful.  This is a finding I would expect to be monitored periodically by your cardiology team. Most unruptured thoracic aortic aneurysms cause no symptoms, so they are often found during exams for other conditions. Contact a health care provider if you develop any discomfort in your upper back, neck, abdomen, trouble swallowing, cough or hoarseness, or unexplained weight loss. Get help right away if you develop severe pain in your upper back or abdomen that may move into your chest and arms, or any other concerning symptoms such as shortness of breath or fever.

## 2023-11-08 NOTE — Progress Notes (Signed)
 Cardiology Office Note:  .   Date:  11/08/2023  ID:  Jill Jenkins, DOB 03/04/1966, MRN [id number] PCP: Elder Negus, NP  [facility] Providers Cardiologist:  Chilton Si, MD    History of Present Illness: Marland Kitchen   Jill Jenkins is a 58 y.o. female with hx of pericardial cyst and ascending aortic aneurysm.   Hospitalized 02/2021 with chest pain, dyspnea, syncope. CTA negative for PE but showed 4.3 cm pericardial cyst. She was started on colchicine for 3 months for concern for pericarditis. Cyst was surgically removed by Dr. Cliffton Asters 02/20/21.   cMRI 07/2021 with no recurrent peicardial cyst and ascending aorta unchanged at 4.0 cm. ZIO due to palpitations 07/2021 NSR with PVC and PAC.   She contacted the office 10/2021 and treated for recurrent pericarditis treated with prednisone.   Echo 01/2022 LVEF 60-65%, no recurrent pericardial cyst. cMRI 09/2022 stable with no recurrent cyst.   Last seen 11/05/22 doing well from cardiac perspective. She was participating in PT for spinal stenosis.   cMRI 09/2023 stable dilated ascending aorta 42mm. No evidence of recurrence of pericardial cyst.   Presents today for follow up. Overall she is feeling great. She continues yoga and is working with a doctor on weight loss. She reports losing 40 lbs. She is prioritizing increasing her protein and eating balanced meals. She is open to proceed with echo 01/25 d/t cost of MRI.  Reports no shortness of breath nor dyspnea on exertion. Reports no chest pain, pressure, or tightness. No edema, orthopnea, PND. Reports no palpitations.   ROS: Please see the history of present illness.    All other systems reviewed and are negative.   Studies Reviewed: Marland Kitchen   EKG Interpretation Date/Time:  Monday November 08 2023 11:26:46 EST Ventricular Rate:  69 PR Interval:  124 QRS Duration:  78 QT Interval:  390 QTC Calculation: 417 R Axis:   57  Text Interpretation: Normal sinus rhythm  No acute ST/T wave changes.  Confirmed by Gillian Shields ([id number]) on 11/08/2023 11:30:54 AM    Cardiac Studies & Procedures   ______________________________________________________________________________________________     ECHOCARDIOGRAM  ECHOCARDIOGRAM COMPLETE 01/26/2022  Narrative ECHOCARDIOGRAM REPORT    Patient Name:   Jill Jenkins Date of Exam: 01/26/2022 Medical Rec #:  [id number]      Height:       69.0 in Accession #:    [id number]     Weight:       208.8 lb Date of Birth:  1966/08/28      BSA:          2.104 m Patient Age:    55 years       BP:           118/66 mmHg Patient Gender: F              HR:           64 bpm. Exam Location:  [address]  Procedure: 2D Echo, 3D Echo, Cardiac Doppler and Color Doppler  Indications:    I71.21 Ascending Aortic Aneurysm  History:        Patient has prior history of Echocardiogram examinations, most recent 02/16/2021. Risk Factors:Family History of Coronary Artery Disease. Pericarditis, Pericardial Effusion, Palpitations.  Sonographer:    Farrel Conners RDCS Referring Phys: Thomasene Ripple CLEAVER  IMPRESSIONS   1. Left ventricular ejection fraction, by estimation, is 60 to 65%. The left ventricle has normal function. The left ventricle has no regional wall motion abnormalities.  Left ventricular diastolic function could not be evaluated. 2. Right ventricular systolic function is normal. The right ventricular size is normal. There is normal pulmonary artery systolic pressure. The estimated right ventricular systolic pressure is 26.4 mmHg. 3. The mitral valve is grossly normal. Mild mitral valve regurgitation. No evidence of mitral stenosis. 4. The aortic valve is grossly normal. Aortic valve regurgitation is not visualized. No aortic stenosis is present. 5. There is moderate dilatation of the ascending aorta, measuring 43 mm. 6. The inferior vena cava is normal in size with greater than 50% respiratory variability, suggesting right atrial pressure of 3  mmHg.  Comparison(s): No significant change from prior study.  FINDINGS Left Ventricle: Left ventricular ejection fraction, by estimation, is 60 to 65%. The left ventricle has normal function. The left ventricle has no regional wall motion abnormalities. 3D left ventricular ejection fraction analysis performed but not reported based on interpreter judgement due to suboptimal tracking. The left ventricular internal cavity size was normal in size. There is no left ventricular hypertrophy. Left ventricular diastolic function could not be evaluated.  Right Ventricle: The right ventricular size is normal. No increase in right ventricular wall thickness. Right ventricular systolic function is normal. There is normal pulmonary artery systolic pressure. The tricuspid regurgitant velocity is 2.42 m/s, and with an assumed right atrial pressure of 3 mmHg, the estimated right ventricular systolic pressure is 26.4 mmHg.  Left Atrium: Left atrial size was normal in size.  Right Atrium: Right atrial size was normal in size.  Pericardium: Trivial pericardial effusion is present.  Mitral Valve: The mitral valve is grossly normal. Mild mitral valve regurgitation. No evidence of mitral valve stenosis.  Tricuspid Valve: The tricuspid valve is grossly normal. Tricuspid valve regurgitation is mild . No evidence of tricuspid stenosis.  Aortic Valve: The aortic valve is grossly normal. Aortic valve regurgitation is not visualized. No aortic stenosis is present.  Pulmonic Valve: The pulmonic valve was grossly normal. Pulmonic valve regurgitation is trivial. No evidence of pulmonic stenosis.  Aorta: The aortic root is normal in size and structure. There is moderate dilatation of the ascending aorta, measuring 43 mm.  Venous: The right lower pulmonary vein is normal. The inferior vena cava is normal in size with greater than 50% respiratory variability, suggesting right atrial pressure of 3 mmHg.  IAS/Shunts: The  atrial septum is grossly normal.   LEFT VENTRICLE PLAX 2D LVIDd:         4.30 cm LVIDs:         1.90 cm LV PW:         0.90 cm LV IVS:        1.10 cm LVOT diam:     2.10 cm   3D Volume EF: LV SV:         87        3D EF:        73 % LV SV Index:   42        LV EDV:       90 ml LVOT Area:     3.46 cm  LV ESV:       25 ml LV SV:        65 ml  RIGHT VENTRICLE RV Basal diam:  3.60 cm RV S prime:     15.00 cm/s TAPSE (M-mode): 2.0 cm  LEFT ATRIUM             Index        RIGHT ATRIUM  Index LA diam:        4.00 cm 1.90 cm/m   RA Area:     17.40 cm LA Vol (A2C):   63.5 ml 30.18 ml/m  RA Volume:   43.40 ml  20.63 ml/m LA Vol (A4C):   41.1 ml 19.53 ml/m LA Biplane Vol: 51.9 ml 24.67 ml/m AORTIC VALVE LVOT Vmax:   108.00 cm/s LVOT Vmean:  73.850 cm/s LVOT VTI:    0.252 m  AORTA Ao Root diam: 3.60 cm Ao Asc diam:  4.30 cm  MITRAL VALVE               TRICUSPID VALVE MV Area (PHT): cm         TR Peak grad:   23.4 mmHg MV Decel Time: 342 msec    TR Vmax:        242.00 cm/s MV E velocity: 83.60 cm/s MV A velocity: 49.90 cm/s  SHUNTS MV E/A ratio:  1.68        Systemic VTI:  0.25 m Systemic Diam: 2.10 cm  Lennie Odor MD Electronically signed by Lennie Odor MD Signature Date/Time: 01/26/2022/4:03:06 PM    Final    MONITORS  CARDIAC EVENT MONITOR 08/12/2021  Narrative 30-day event Monitor  Quality: Fair.  Baseline artifact. Predominant rhythm: Sinus rhythm Average heart rate: 69 bpm Max heart rate: 132 bpm Min heart rate: 47 bpm Pauses >2.5 seconds: None  Less than 1% PVC Less than 1% PAC  Tiffany C. Duke Salvia, MD, [facility] 08/13/2021 6:53 PM     CARDIAC MRI  MR CARDIAC MORPHOLOGY W WO CONTRAST 09/20/2023  Narrative CLINICAL DATA:  S/p pericardial cyst resection  EXAM: CARDIAC MRI  TECHNIQUE: The patient was scanned on a 1.5 Tesla Siemens magnet. A dedicated cardiac coil was used. Functional imaging was done using Fiesta sequences.  2,3, and 4 chamber views were done to assess for RWMA's. Modified Simpson's rule using a short axis stack was used to calculate an ejection fraction on a dedicated work [employer]. The patient received 10 cc of Gadavist. After 10 minutes inversion recovery sequences were used to assess for infiltration and scar tissue. Phase contrast velocity mapping was performed above the aortic and pulmonic valves  CONTRAST:  10 cc  of Gadavist  FINDINGS: Left ventricle:  -Normal size  -Normal wall thickness  -Normal systolic function  -Normal ECV (27%, assuming Hct 40%)  -No LGE  LV EF:  67% (Normal 52-79%)  Absolute volumes:  LV EDV: (Normal 78-167 mL)  LV ESV: 35mL (Normal 21-64 mL)  LV SV: 70mL (Normal 52-114 mL)  CO: 4.3L/min (Normal 2.7-6.3 L/min)  Indexed volumes:  LV EDV: 67mL/sq-m (Normal 50-96 mL/sq-m)  LV ESV: 91mL/sq-m (Normal 10-40 mL/sq-m)  LV SV: 67mL/sq-m (Normal 33-64 mL/sq-m)  CI: 2.0L/min/sq-m (Normal 1.9-3.9 L/min/sq-m)  Right ventricle: Normal size and systolic function  RV EF: 61% (Normal 52-80%)  Absolute volumes:  RV EDV: (Normal 79-175 mL)  RV ESV: 47mL (Normal 13-75 mL)  RV SV: 74mL (Normal 56-110 mL)  CO: 4.6L/min (Normal 2.7-6 L/min)  Indexed volumes:  RV EDV: 40mL/sq-m (Normal 51-97 mL/sq-m)  RV ESV: 70mL/sq-m (Normal 9-42 mL/sq-m)  RV SV: 20mL/sq-m (Normal 35-61 mL/sq-m)  CI: 2.1L/min/sq-m (Normal 1.8-3.8 L/min/sq-m)  Left atrium: Normal size  Right atrium: Normal size  Mitral valve: Trivial regurgitation  Aortic valve: Tricuspid.  Trivial regurgitation  Tricuspid valve: Trivial regurgitation  Pulmonic valve: No regurgitation  Aorta: Dilated ascending aorta measuring 42mm  Pericardium: S/p pericardial cyst  resection. No evidence of recurrence  Extracardiac structures: Multiple liver lesions, previously evaluated with [place] 02/19/21 and consistent with cysts  IMPRESSION: 1.  S/p  pericardial cyst resection.  No evidence of recurrence  2.  Normal LV size, wall thickness, and systolic function (EF 67%)  3.  Normal RV size and systolic function (EF 61%)  4.  No late gadolinium enhancement to suggest myocardial scar  5.  Dilated ascending aorta measuring 42mm   Electronically Signed By: Epifanio Lesches M.D. On: 09/21/2023 23:06   ______________________________________________________________________________________________      Risk Assessment/Calculations:             Physical Exam:   VS:  BP 128/74   Pulse 74   Ht 5\' 9"  (1.753 m)   Wt 175 lb 12.8 oz (79.7 kg)   SpO2 98%   BMI 25.96 kg/m    Wt Readings from Last 3 Encounters:  11/08/23 175 lb 12.8 oz (79.7 kg)  02/02/23 213 lb (96.6 kg)  01/11/23 214 lb (97.1 kg)    GEN: Well nourished, well developed in no acute distress NECK: No JVD; No carotid bruits CARDIAC: RRR, no murmurs, rubs, gallops RESPIRATORY:  Clear to auscultation without rales, wheezing or rhonchi  ABDOMEN: Soft, non-tender, non-distended EXTREMITIES:  No edema; No deformity   ASSESSMENT AND PLAN: .    Ascending aortic aneurysm - Cardiac MRI 09/21/2023 shows 42 mm. BP stable and patient is working on diet and weight loss with GLP-1. EKG today unremarkable. Repeat echo in  1 year to reassess (prefers echo due to lower cost). Continue optimal BP control, avoid fluoroquinolones.   Pericardial cyst - cardiac MRI 09/21/2023 shows no signs of reoccurrence. Further imaging only as clinically indicated.   Pericarditis - No recurrence.        Dispo: Follow up in a year.   Signed, Alver Sorrow, NP

## 2023-11-23 ENCOUNTER — Other Ambulatory Visit: Payer: Self-pay

## 2023-12-02 ENCOUNTER — Other Ambulatory Visit: Payer: Self-pay

## 2023-12-02 ENCOUNTER — Telehealth: Payer: Self-pay

## 2023-12-02 NOTE — Telephone Encounter (Signed)
..     Pre-operative Risk Assessment    Patient Name: Jill Jenkins  DOB: 02-21-1966 MRN: [id number]   Date of last office visit: 11/08/23 Date of next office visit: none   Request for Surgical Clearance    Procedure:   left shoulder arthroscopic debridement, subacromial decompression, distal clavicle excision  Date of Surgery:  Clearance TBD                                Surgeon:  DR. Jones Broom Surgeon's Group or Practice Name:  [facility] Phone number:  [phone] Fax number:  [phone]   Type of Clearance Requested:   - Medical    Type of Anesthesia:   CHOICE   Additional requests/questions:    [name]   12/02/2023, 11:36 AM

## 2023-12-02 NOTE — Progress Notes (Signed)
 Specialty Pharmacy Refill Coordination Note  Jill Jenkins is a 58 y.o. female contacted today regarding refills of specialty medication(s) OnabotulinumtoxinA (BOTOX)   Patient requested Courier to Provider Office   Delivery date: 12/13/23   Verified address: [facility]   Medication will be filled on 12/10/23.

## 2023-12-06 NOTE — Telephone Encounter (Signed)
Exercise tolerance >4 METS. Per AHA/ACC guidelines, she is deemed acceptable risk for the planned procedure without additional cardiovascular testing.  Alver Sorrow, NP

## 2023-12-07 ENCOUNTER — Telehealth: Payer: Self-pay | Admitting: Neurology

## 2023-12-07 NOTE — Telephone Encounter (Signed)
 We see the patient for migraine management. I don't see any indication that would be a contraindication for the patient to move forward with the procedure from neurological standpoint. I will provide the form to [facility] for review and ensure pt is cleared from neurology.

## 2023-12-07 NOTE — Telephone Encounter (Signed)
 No contraindication from migraine standpoint to proceed with surgical procedure.  Thank you.

## 2023-12-07 NOTE — Telephone Encounter (Signed)
 Pt is scheduled to undergo Left shoulder arthroscopic debridement , subacromial decompression, distal clavicle excision.  We see

## 2023-12-07 NOTE — Telephone Encounter (Signed)
 I have faxed the signed form to the [facility] and received confirmation that it went through.

## 2023-12-10 ENCOUNTER — Other Ambulatory Visit: Payer: Self-pay

## 2023-12-15 ENCOUNTER — Ambulatory Visit (INDEPENDENT_AMBULATORY_CARE_PROVIDER_SITE_OTHER): Admitting: Adult Health

## 2023-12-15 VITALS — BP 126/92 | HR 75

## 2023-12-15 DIAGNOSIS — G43709 Chronic migraine without aura, not intractable, without status migrainosus: Secondary | ICD-10-CM

## 2023-12-15 MED ORDER — ONABOTULINUMTOXINA 200 UNITS IJ SOLR
155.0000 [IU] | Freq: Once | INTRAMUSCULAR | Status: AC
  Administered 2023-12-15: 155 [IU] via INTRAMUSCULAR

## 2023-12-15 NOTE — Progress Notes (Signed)
 Update 12/15/2023 JM: Patient returns for repeat Botox.  Prior injection 09/22/2023.  Returns for repeat Botox injection.  Migraines remain well-controlled on Botox and Ajovy. She has not had a migraine headache since her prior injection 3 months ago.  Use of Relpax with benefit. Tolerated procedure well today.  Return in 3 months for repeat injection    Consent Form Botulism Toxin Injection For Chronic Migraine    Reviewed orally with patient, additionally signature is on file:  Botulism toxin has been approved by the Federal drug administration for treatment of chronic migraine. Botulism toxin does not cure chronic migraine and it may not be effective in some patients.  The administration of botulism toxin is accomplished by injecting a small amount of toxin into the muscles of the neck and head. Dosage must be titrated for each individual. Any benefits resulting from botulism toxin tend to wear off after 3 months with a repeat injection required if benefit is to be maintained. Injections are usually done every 3-4 months with maximum effect peak achieved by about 2 or 3 weeks. Botulism toxin is expensive and you should be sure of what costs you will incur resulting from the injection.  The side effects of botulism toxin use for chronic migraine may include:   -Transient, and usually mild, facial weakness with facial injections  -Transient, and usually mild, head or neck weakness with head/neck injections  -Reduction or loss of forehead facial animation due to forehead muscle weakness  -Eyelid drooping  -Dry eye  -Pain at the site of injection or bruising at the site of injection  -Double vision  -Potential unknown long term risks   Contraindications: You should not have Botox if you are pregnant, nursing, allergic to albumin, have an infection, skin condition, or muscle weakness at the site of the injection, or have myasthenia gravis, Lambert-Eaton syndrome, or ALS.  It is also  possible that as with any injection, there may be an allergic reaction or no effect from the medication. Reduced effectiveness after repeated injections is sometimes seen and rarely infection at the injection site may occur. All care will be taken to prevent these side effects. If therapy is given over a long time, atrophy and wasting in the muscle injected may occur. Occasionally the patient's become refractory to treatment because they develop antibodies to the toxin. In this event, therapy needs to be modified.  I have read the above information and consent to the administration of botulism toxin.    BOTOX PROCEDURE NOTE FOR MIGRAINE HEADACHE  Contraindications and precautions discussed with patient(above). Aseptic procedure was observed and patient tolerated procedure. Procedure performed by Ihor Austin, AGNP-BC.   The condition has existed for more than 6 months, and pt does not have a diagnosis of ALS, Myasthenia Gravis or Lambert-Eaton Syndrome.  Risks and benefits of injections discussed and pt agrees to proceed with the procedure.  Written consent obtained  These injections are medically necessary. Pt  receives good benefits from these injections. These injections do not cause sedations or hallucinations which the oral therapies may cause.   Description of procedure:  The patient was placed in a sitting position. The standard protocol was used for Botox as follows, with 5 units of Botox injected at each site:  -Procerus muscle, midline injection  -Corrugator muscle, bilateral injection  -Frontalis muscle, bilateral injection, with 2 sites each side, medial injection was performed in the upper one third of the frontalis muscle, in the region vertical from  the medial inferior edge of the superior orbital rim. The lateral injection was again in the upper one third of the forehead vertically above the lateral limbus of the cornea, 1.5 cm lateral to the medial injection  site.  -Temporalis muscle injection, 4 sites, bilaterally. The first injection was 3 cm above the tragus of the ear, second injection site was 1.5 cm to 3 cm up from the first injection site in line with the tragus of the ear. The third injection site was 1.5-3 cm forward between the first 2 injection sites. The fourth injection site was 1.5 cm posterior to the second injection site. 5th site laterally in the temporalis  muscleat the level of the outer canthus.  -Occipitalis muscle injection, 3 sites, bilaterally. The first injection was done one half way between the occipital protuberance and the tip of the mastoid process behind the ear. The second injection site was done lateral and superior to the first, 1 fingerbreadth from the first injection. The third injection site was 1 fingerbreadth superiorly and medially from the first injection site.  -Cervical paraspinal muscle injection, 2 sites, bilaterally. The first injection site was 1 cm from the midline of the cervical spine, 3 cm inferior to the lower border of the occipital protuberance. The second injection site was 1.5 cm superiorly and laterally to the first injection site.  -Trapezius muscle injection was performed at 3 sites, bilaterally. The first injection site was in the upper trapezius muscle halfway between the inflection point of the neck, and the acromion. The second injection site was one half way between the acromion and the first injection site. The third injection was done between the first injection site and the inflection point of the neck.    A total of 200 units of Botox was prepared, 155 units of Botox was injected as documented above, any Botox not injected was wasted. The patient tolerated the procedure well, there were no complications of the above procedure.   Ihor Austin, AGNP-BC  [facility] [address] [place], [place] [address]  Phone [phone] Fax [phone] Note:  This document was prepared with digital dictation and possible smart phrase technology. Any transcriptional errors that result from this process are unintentional.

## 2023-12-15 NOTE — Progress Notes (Signed)
 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

## 2023-12-16 ENCOUNTER — Ambulatory Visit: Payer: BC Managed Care – PPO | Admitting: Adult Health

## 2024-02-09 ENCOUNTER — Telehealth: Payer: Self-pay | Admitting: Adult Health

## 2024-02-09 NOTE — Telephone Encounter (Signed)
 Submitted auth request via CMM, received approval. Pt will continue to fill through [facility]. Auth#: [id number] (01/10/24-02/08/25)

## 2024-02-17 ENCOUNTER — Other Ambulatory Visit (HOSPITAL_COMMUNITY): Payer: Self-pay

## 2024-02-17 ENCOUNTER — Telehealth: Payer: Self-pay

## 2024-02-17 NOTE — Telephone Encounter (Signed)
 Pharmacy Patient Advocate Encounter  Received notification from [employer] that Prior Authorization for AJOVY  (fremanezumab -vfrm) injection 225MG /1.5ML auto-injectors has been APPROVED from 01/18/2024 to 02/16/2025. Ran test claim, Copay is $225.45 per 84DS or $24.98 per 28DS. This test claim was processed through [facility]- copay amounts may vary at other pharmacies due to pharmacy/plan contracts, or as the patient moves through the different stages of their insurance plan.   PA #/Case ID/Reference #: PA Case ID #: [id number]

## 2024-03-01 ENCOUNTER — Other Ambulatory Visit (HOSPITAL_COMMUNITY): Payer: Self-pay

## 2024-03-01 ENCOUNTER — Other Ambulatory Visit: Payer: Self-pay | Admitting: Neurology

## 2024-03-01 ENCOUNTER — Other Ambulatory Visit: Payer: Self-pay

## 2024-03-01 DIAGNOSIS — G43709 Chronic migraine without aura, not intractable, without status migrainosus: Secondary | ICD-10-CM

## 2024-03-01 MED ORDER — BOTOX 200 UNITS IJ SOLR
INTRAMUSCULAR | 3 refills | Status: AC
  Filled 2024-03-01: qty 1, 84d supply, fill #0
  Filled 2024-05-23: qty 1, 84d supply, fill #1
  Filled 2024-08-17: qty 1, 84d supply, fill #2

## 2024-03-01 NOTE — Telephone Encounter (Signed)
 Please send refills to [facility], pt has an appointment on 6/25.

## 2024-03-01 NOTE — Telephone Encounter (Signed)
 Refill has been sent via Henrine Logan CMA to pharmacy for the pt

## 2024-03-01 NOTE — Progress Notes (Signed)
 Specialty Pharmacy Refill Coordination Note  Jill Jenkins is a 58 y.o. female assessed today regarding refills of clinic administered specialty medication(s) OnabotulinumtoxinA  (Botox )   Clinic requested Courier to Provider Office   Delivery date: 03/02/24  Injection date 03/09/24   Verified address: [facility] [place] [address]   Medication will be filled on 03/01/24.

## 2024-03-08 ENCOUNTER — Ambulatory Visit (INDEPENDENT_AMBULATORY_CARE_PROVIDER_SITE_OTHER): Admitting: Adult Health

## 2024-03-08 VITALS — BP 141/82 | HR 76

## 2024-03-08 DIAGNOSIS — G43709 Chronic migraine without aura, not intractable, without status migrainosus: Secondary | ICD-10-CM | POA: Diagnosis not present

## 2024-03-08 MED ORDER — ONABOTULINUMTOXINA 200 UNITS IJ SOLR
155.0000 [IU] | Freq: Once | INTRAMUSCULAR | Status: AC
  Administered 2024-03-08: 155 [IU] via INTRAMUSCULAR

## 2024-03-08 NOTE — Progress Notes (Signed)
 Botox - 200 units x 1 vial Lot: [id number] Expiration: 06/2026 NDC: 9976-6078-97   Bacteriostatic 0.9% Sodium Chloride - 4mL total [id number] Expiration: 07/14/2025 NDC: 9590-8033-97   Dx: H56.290 SP   Witnessed by: Rojean DEL

## 2024-03-08 NOTE — Progress Notes (Signed)
 Update 03/08/2024 JM: Patient returns for repeat Botox .  Prior injection 12/15/2023.  Returns for repeat Botox  injection.  Migraines remain well-controlled on Botox  and Ajovy . She has not had a migraine headache since her prior injection 3 months ago.  Use of Relpax  with benefit. Insurance would not cover Nurtec in addition to Relpax . Tolerated procedure well today.  Return in 3 months for repeat injection.     Consent Form Botulism Toxin Injection For Chronic Migraine    Reviewed orally with patient, additionally signature is on file:  Botulism toxin has been approved by the Federal drug administration for treatment of chronic migraine. Botulism toxin does not cure chronic migraine and it may not be effective in some patients.  The administration of botulism toxin is accomplished by injecting a small amount of toxin into the muscles of the neck and head. Dosage must be titrated for each individual. Any benefits resulting from botulism toxin tend to wear off after 3 months with a repeat injection required if benefit is to be maintained. Injections are usually done every 3-4 months with maximum effect peak achieved by about 2 or 3 weeks. Botulism toxin is expensive and you should be sure of what costs you will incur resulting from the injection.  The side effects of botulism toxin use for chronic migraine may include:   -Transient, and usually mild, facial weakness with facial injections  -Transient, and usually mild, head or neck weakness with head/neck injections  -Reduction or loss of forehead facial animation due to forehead muscle weakness  -Eyelid drooping  -Dry eye  -Pain at the site of injection or bruising at the site of injection  -Double vision  -Potential unknown long term risks   Contraindications: You should not have Botox  if you are pregnant, nursing, allergic to albumin, have an infection, skin condition, or muscle weakness at the site of the injection, or have  myasthenia gravis, Lambert-Eaton syndrome, or ALS.  It is also possible that as with any injection, there may be an allergic reaction or no effect from the medication. Reduced effectiveness after repeated injections is sometimes seen and rarely infection at the injection site may occur. All care will be taken to prevent these side effects. If therapy is given over a long time, atrophy and wasting in the muscle injected may occur. Occasionally the patient's become refractory to treatment because they develop antibodies to the toxin. In this event, therapy needs to be modified.  I have read the above information and consent to the administration of botulism toxin.    BOTOX  PROCEDURE NOTE FOR MIGRAINE HEADACHE  Contraindications and precautions discussed with patient(above). Aseptic procedure was observed and patient tolerated procedure. Procedure performed by Harlene Bogaert, AGNP-BC.   The condition has existed for more than 6 months, and pt does not have a diagnosis of ALS, Myasthenia Gravis or Lambert-Eaton Syndrome.  Risks and benefits of injections discussed and pt agrees to proceed with the procedure.  Written consent obtained  These injections are medically necessary. Pt  receives good benefits from these injections. These injections do not cause sedations or hallucinations which the oral therapies may cause.   Description of procedure:  The patient was placed in a sitting position. The standard protocol was used for Botox  as follows, with 5 units of Botox  injected at each site:  -Procerus muscle, midline injection  -Corrugator muscle, bilateral injection  -Frontalis muscle, bilateral injection, with 2 sites each side, medial injection was performed in the upper one  third of the frontalis muscle, in the region vertical from the medial inferior edge of the superior orbital rim. The lateral injection was again in the upper one third of the forehead vertically above the lateral limbus of the  cornea, 1.5 cm lateral to the medial injection site.  -Temporalis muscle injection, 4 sites, bilaterally. The first injection was 3 cm above the tragus of the ear, second injection site was 1.5 cm to 3 cm up from the first injection site in line with the tragus of the ear. The third injection site was 1.5-3 cm forward between the first 2 injection sites. The fourth injection site was 1.5 cm posterior to the second injection site. 5th site laterally in the temporalis  muscleat the level of the outer canthus.  -Occipitalis muscle injection, 3 sites, bilaterally. The first injection was done one half way between the occipital protuberance and the tip of the mastoid process behind the ear. The second injection site was done lateral and superior to the first, 1 fingerbreadth from the first injection. The third injection site was 1 fingerbreadth superiorly and medially from the first injection site.  -Cervical paraspinal muscle injection, 2 sites, bilaterally. The first injection site was 1 cm from the midline of the cervical spine, 3 cm inferior to the lower border of the occipital protuberance. The second injection site was 1.5 cm superiorly and laterally to the first injection site.  -Trapezius muscle injection was performed at 3 sites, bilaterally. The first injection site was in the upper trapezius muscle halfway between the inflection point of the neck, and the acromion. The second injection site was one half way between the acromion and the first injection site. The third injection was done between the first injection site and the inflection point of the neck.    A total of 200 units of Botox  was prepared, 155 units of Botox  was injected as documented above, any Botox  not injected was wasted. The patient tolerated the procedure well, there were no complications of the above procedure.   Harlene Bogaert, AGNP-BC  [facility] [address] [place], [place]  [address]  Phone [phone] Fax [phone] Note: This document was prepared with digital dictation and possible smart phrase technology. Any transcriptional errors that result from this process are unintentional.

## 2024-03-27 ENCOUNTER — Other Ambulatory Visit (HOSPITAL_COMMUNITY): Payer: Self-pay

## 2024-03-27 ENCOUNTER — Telehealth: Payer: Self-pay

## 2024-03-27 NOTE — Telephone Encounter (Signed)
 Pharmacy Patient Advocate Encounter   Received notification from [employer] that prior authorization for Nurtec 75MG  dispersible tablets is required/requested.   Insurance verification completed.   The patient is insured through [employer] .   Per test claim: PA required; PA submitted to above mentioned insurance via [employer] Key/confirmation #/EOC [id number] Status is pending

## 2024-03-27 NOTE — Telephone Encounter (Signed)
 Pharmacy Patient Advocate Encounter  Received notification from [employer] that Prior Authorization for Nurtec 75MG  dispersible tablets has been APPROVED from 03/27/2024 to 03/27/2025. Ran test claim, Copay is $0. This test claim was processed through [facility]- copay amounts may vary at other pharmacies due to pharmacy/plan contracts, or as the patient moves through the different stages of their insurance plan.   PA #/Case ID/Reference #: PA Case ID #: [id number]

## 2024-04-26 ENCOUNTER — Other Ambulatory Visit: Payer: Self-pay | Admitting: Obstetrics and Gynecology

## 2024-04-26 ENCOUNTER — Encounter: Payer: Self-pay | Admitting: Obstetrics and Gynecology

## 2024-04-26 DIAGNOSIS — T8544XA Capsular contracture of breast implant, initial encounter: Secondary | ICD-10-CM

## 2024-05-03 ENCOUNTER — Ambulatory Visit

## 2024-05-03 ENCOUNTER — Ambulatory Visit
Admission: RE | Admit: 2024-05-03 | Discharge: 2024-05-03 | Disposition: A | Discharge status: Home / Self Care | Source: Ambulatory Visit | Attending: Obstetrics and Gynecology | Admitting: Obstetrics and Gynecology

## 2024-05-03 ENCOUNTER — Other Ambulatory Visit: Payer: Self-pay | Admitting: Obstetrics and Gynecology

## 2024-05-03 DIAGNOSIS — T8544XA Capsular contracture of breast implant, initial encounter: Secondary | ICD-10-CM

## 2024-05-23 ENCOUNTER — Other Ambulatory Visit: Payer: Self-pay

## 2024-05-23 NOTE — Progress Notes (Signed)
 Specialty Pharmacy Refill Coordination Note  Jill Jenkins is a 58 y.o. female assessed today regarding refills of clinic administered specialty medication(s) OnabotulinumtoxinA  (Botox )   Clinic requested Courier to Provider Office   Delivery date: 05/25/24   Verified address: [facility] [place] [address]   Medication will be filled on 05/24/24.    Appointment 05/31/24.

## 2024-05-31 ENCOUNTER — Ambulatory Visit (INDEPENDENT_AMBULATORY_CARE_PROVIDER_SITE_OTHER): Admitting: Adult Health

## 2024-05-31 DIAGNOSIS — G43009 Migraine without aura, not intractable, without status migrainosus: Secondary | ICD-10-CM

## 2024-05-31 DIAGNOSIS — G43709 Chronic migraine without aura, not intractable, without status migrainosus: Secondary | ICD-10-CM

## 2024-05-31 MED ORDER — ONABOTULINUMTOXINA 200 UNITS IJ SOLR
155.0000 [IU] | Freq: Once | INTRAMUSCULAR | Status: AC
  Administered 2024-05-31: 155 [IU] via INTRAMUSCULAR

## 2024-05-31 NOTE — Progress Notes (Signed)
 Update 03/08/2024 JM: Patient returns for repeat Botox .  Prior injection 12/15/2023.  Returns for repeat Botox  injection.  Migraines remain well-controlled on Botox  and Ajovy .  Continues to experience >50% improvement of migraine frequency and severity on Botox .  Use of Relpax  as needed.  Insurance continues to decline Nurtec. Tolerated procedure well today.  Return in 3 months for repeat injection.   She does mention having onset of left ear tinnitus and hearing loss since her cruise in July. At times, she can experience dizziness/vertigo sensation.  She is currently being evaluated by ENT in [place] and is scheduled to undergo MRI tomorrow.      Consent Form Botulism Toxin Injection For Chronic Migraine    Reviewed orally with patient, additionally signature is on file:  Botulism toxin has been approved by the Federal drug administration for treatment of chronic migraine. Botulism toxin does not cure chronic migraine and it may not be effective in some patients.  The administration of botulism toxin is accomplished by injecting a small amount of toxin into the muscles of the neck and head. Dosage must be titrated for each individual. Any benefits resulting from botulism toxin tend to wear off after 3 months with a repeat injection required if benefit is to be maintained. Injections are usually done every 3-4 months with maximum effect peak achieved by about 2 or 3 weeks. Botulism toxin is expensive and you should be sure of what costs you will incur resulting from the injection.  The side effects of botulism toxin use for chronic migraine may include:   -Transient, and usually mild, facial weakness with facial injections  -Transient, and usually mild, head or neck weakness with head/neck injections  -Reduction or loss of forehead facial animation due to forehead muscle weakness  -Eyelid drooping  -Dry eye  -Pain at the site of injection or bruising at the site of  injection  -Double vision  -Potential unknown long term risks   Contraindications: You should not have Botox  if you are pregnant, nursing, allergic to albumin, have an infection, skin condition, or muscle weakness at the site of the injection, or have myasthenia gravis, Lambert-Eaton syndrome, or ALS.  It is also possible that as with any injection, there may be an allergic reaction or no effect from the medication. Reduced effectiveness after repeated injections is sometimes seen and rarely infection at the injection site may occur. All care will be taken to prevent these side effects. If therapy is given over a long time, atrophy and wasting in the muscle injected may occur. Occasionally the patient's become refractory to treatment because they develop antibodies to the toxin. In this event, therapy needs to be modified.  I have read the above information and consent to the administration of botulism toxin.    BOTOX  PROCEDURE NOTE FOR MIGRAINE HEADACHE  Contraindications and precautions discussed with patient(above). Aseptic procedure was observed and patient tolerated procedure. Procedure performed by Harlene Bogaert, AGNP-BC.   The condition has existed for more than 6 months, and pt does not have a diagnosis of ALS, Myasthenia Gravis or Lambert-Eaton Syndrome.  Risks and benefits of injections discussed and pt agrees to proceed with the procedure.  Written consent obtained  These injections are medically necessary. Pt  receives good benefits from these injections. These injections do not cause sedations or hallucinations which the oral therapies may cause.   Description of procedure:  The patient was placed in a sitting position. The standard protocol was used for  Botox  as follows, with 5 units of Botox  injected at each site:  -Procerus muscle, midline injection  -Corrugator muscle, bilateral injection  -Frontalis muscle, bilateral injection, with 2 sites each side, medial injection  was performed in the upper one third of the frontalis muscle, in the region vertical from the medial inferior edge of the superior orbital rim. The lateral injection was again in the upper one third of the forehead vertically above the lateral limbus of the cornea, 1.5 cm lateral to the medial injection site.  -Temporalis muscle injection, 4 sites, bilaterally. The first injection was 3 cm above the tragus of the ear, second injection site was 1.5 cm to 3 cm up from the first injection site in line with the tragus of the ear. The third injection site was 1.5-3 cm forward between the first 2 injection sites. The fourth injection site was 1.5 cm posterior to the second injection site. 5th site laterally in the temporalis  muscleat the level of the outer canthus.  -Occipitalis muscle injection, 3 sites, bilaterally. The first injection was done one half way between the occipital protuberance and the tip of the mastoid process behind the ear. The second injection site was done lateral and superior to the first, 1 fingerbreadth from the first injection. The third injection site was 1 fingerbreadth superiorly and medially from the first injection site.  -Cervical paraspinal muscle injection, 2 sites, bilaterally. The first injection site was 1 cm from the midline of the cervical spine, 3 cm inferior to the lower border of the occipital protuberance. The second injection site was 1.5 cm superiorly and laterally to the first injection site.  -Trapezius muscle injection was performed at 3 sites, bilaterally. The first injection site was in the upper trapezius muscle halfway between the inflection point of the neck, and the acromion. The second injection site was one half way between the acromion and the first injection site. The third injection was done between the first injection site and the inflection point of the neck.    A total of 200 units of Botox  was prepared, 155 units of Botox  was injected as  documented above, any Botox  not injected was wasted. The patient tolerated the procedure well, there were no complications of the above procedure.   Harlene Bogaert, AGNP-BC  [facility] [address] [place], [place] [address]  Phone [phone] Fax [phone] Note: This document was prepared with digital dictation and possible smart phrase technology. Any transcriptional errors that result from this process are unintentional.

## 2024-05-31 NOTE — Progress Notes (Signed)
 Botox - 200 units x 1 vial Lot: [id number] Expiration: 07/2026 NDC: 9976-6078-97   Bacteriostatic 0.9% Sodium Chloride - 4mL total [id number] Expiration: 07/14/2025 NDC: 9590-8033-97   Dx: H56.290 SP   Witnessed by: Catheryn

## 2024-06-08 ENCOUNTER — Telehealth: Payer: Self-pay | Admitting: Adult Health

## 2024-06-08 NOTE — Telephone Encounter (Signed)
 Pt called to follow up about MRI that she had last Thursday  in [place] . Pt states that the facility that she went to Faxed over MRI results  and was checking did MD received theses results

## 2024-07-12 NOTE — Telephone Encounter (Signed)
 Prior patient of Dr. Ines. Please schedule her with MD at soonest avabile to establish care as well as discuss ringing and dizziness. Thank you!

## 2024-07-12 NOTE — Telephone Encounter (Signed)
 Spoke with pt today after receiving referral from ENT for migraines and non spinning dizziness. Discussed with patient, she has always had dizziness and tinnitus with her migraines, but back in July during a cruise, has never experienced ringing in her ears like this. ENT notes don't really mention a reason why they are referring to us  but talking with pt-states that they did MRI and never called her, so she spoke with their office yesterday and basically told her there is nothing that we can do for the ringing, that they think it's all related to her migraines and recommended f/u with us  to discuss. Do you think next best step would be for an appt with MD to discuss? Pt wanted me to speak with you first bc she doesn't think an appt in office would be necessary just wants to know if there's anything we have to offer for this.  Please advise, thank you!

## 2024-07-20 ENCOUNTER — Telehealth: Payer: Self-pay | Admitting: Adult Health

## 2024-07-20 NOTE — Telephone Encounter (Signed)
 Pt r/s due to a conflict, she is also on wait list

## 2024-07-31 ENCOUNTER — Institutional Professional Consult (permissible substitution): Admitting: Neurology

## 2024-08-07 ENCOUNTER — Other Ambulatory Visit (HOSPITAL_COMMUNITY): Payer: Self-pay

## 2024-08-08 ENCOUNTER — Other Ambulatory Visit: Payer: Self-pay

## 2024-08-17 ENCOUNTER — Other Ambulatory Visit: Payer: Self-pay

## 2024-08-17 NOTE — Progress Notes (Signed)
 Specialty Pharmacy Refill Coordination Note  Jill Jenkins is a 58 y.o. female assessed today regarding refills of clinic administered specialty medication(s) OnabotulinumtoxinA  (Botox )   Clinic requested Courier to Provider Office   Delivery date: 08/23/24   Verified address: [facility] [place] [address]   Medication will be filled on: 08/22/24

## 2024-08-21 ENCOUNTER — Other Ambulatory Visit (HOSPITAL_COMMUNITY): Payer: Self-pay

## 2024-08-28 ENCOUNTER — Ambulatory Visit: Admitting: Adult Health

## 2024-08-28 VITALS — BP 112/74 | HR 78

## 2024-08-28 DIAGNOSIS — G43709 Chronic migraine without aura, not intractable, without status migrainosus: Secondary | ICD-10-CM

## 2024-08-28 MED ORDER — NURTEC 75 MG PO TBDP: 75.0000 mg | ORAL_TABLET | ORAL | 11 refills | Status: DC | PRN

## 2024-08-28 MED ORDER — ONABOTULINUMTOXINA 200 UNITS IJ SOLR
155.0000 [IU] | Freq: Once | INTRAMUSCULAR | Status: AC
  Administered 2024-08-28: 10:00:00 155 [IU] via INTRAMUSCULAR

## 2024-08-28 NOTE — Progress Notes (Signed)
 Botox - 200 units x 1 vial Lot: [id number] Expiration: 11/2026 NDC: 9976-6078-97   Bacteriostatic 0.9% Sodium Chloride - 4mL total [id number] Expiration: 07/14/2025 NDC: 9590-8033-97   Dx: G43.709 SP   Witnessed by: Rojean DEL

## 2024-08-28 NOTE — Progress Notes (Signed)
 Update 08/28/2024 JM: Patient returns for repeat Botox .  Prior injection 05/31/2024.  Returns for repeat Botox  injection.  Migraines remain well-controlled on Botox  and Ajovy .  Continues to experience >50% improvement of migraine frequency and severity on Botox . Mnigraines can worsen based on weather such as pressure changes. Previously used Nurtec with great benefit and less side effects although insurance previously denied, reports recently speaking with her insurance company who advised her this was approved. Will resubmit to try to get approved again. Will continue with Relpax  as needed at this time as well.  Tolerated procedure well today.  Return in 3 months for repeat injection.      Consent Form Botulism Toxin Injection For Chronic Migraine    Reviewed orally with patient, additionally signature is on file:  Botulism toxin has been approved by the Federal drug administration for treatment of chronic migraine. Botulism toxin does not cure chronic migraine and it may not be effective in some patients.  The administration of botulism toxin is accomplished by injecting a small amount of toxin into the muscles of the neck and head. Dosage must be titrated for each individual. Any benefits resulting from botulism toxin tend to wear off after 3 months with a repeat injection required if benefit is to be maintained. Injections are usually done every 3-4 months with maximum effect peak achieved by about 2 or 3 weeks. Botulism toxin is expensive and you should be sure of what costs you will incur resulting from the injection.  The side effects of botulism toxin use for chronic migraine may include:   -Transient, and usually mild, facial weakness with facial injections  -Transient, and usually mild, head or neck weakness with head/neck injections  -Reduction or loss of forehead facial animation due to forehead muscle weakness  -Eyelid drooping  -Dry eye  -Pain at the site of injection or  bruising at the site of injection  -Double vision  -Potential unknown long term risks   Contraindications: You should not have Botox  if you are pregnant, nursing, allergic to albumin, have an infection, skin condition, or muscle weakness at the site of the injection, or have myasthenia gravis, Lambert-Eaton syndrome, or ALS.  It is also possible that as with any injection, there may be an allergic reaction or no effect from the medication. Reduced effectiveness after repeated injections is sometimes seen and rarely infection at the injection site may occur. All care will be taken to prevent these side effects. If therapy is given over a long time, atrophy and wasting in the muscle injected may occur. Occasionally the patient's become refractory to treatment because they develop antibodies to the toxin. In this event, therapy needs to be modified.  I have read the above information and consent to the administration of botulism toxin.    BOTOX  PROCEDURE NOTE FOR MIGRAINE HEADACHE  Contraindications and precautions discussed with patient(above). Aseptic procedure was observed and patient tolerated procedure. Procedure performed by Harlene Bogaert, AGNP-BC.   The condition has existed for more than 6 months, and pt does not have a diagnosis of ALS, Myasthenia Gravis or Lambert-Eaton Syndrome.  Risks and benefits of injections discussed and pt agrees to proceed with the procedure.  Written consent obtained  These injections are medically necessary. Pt  receives good benefits from these injections. These injections do not cause sedations or hallucinations which the oral therapies may cause.   Description of procedure:  The patient was placed in a sitting position. The standard protocol was  used for Botox  as follows, with 5 units of Botox  injected at each site:  -Procerus muscle, midline injection  -Corrugator muscle, bilateral injection  -Frontalis muscle, bilateral injection, with 2 sites each  side, medial injection was performed in the upper one third of the frontalis muscle, in the region vertical from the medial inferior edge of the superior orbital rim. The lateral injection was again in the upper one third of the forehead vertically above the lateral limbus of the cornea, 1.5 cm lateral to the medial injection site.  -Temporalis muscle injection, 4 sites, bilaterally. The first injection was 3 cm above the tragus of the ear, second injection site was 1.5 cm to 3 cm up from the first injection site in line with the tragus of the ear. The third injection site was 1.5-3 cm forward between the first 2 injection sites. The fourth injection site was 1.5 cm posterior to the second injection site. 5th site laterally in the temporalis  muscleat the level of the outer canthus.  -Occipitalis muscle injection, 3 sites, bilaterally. The first injection was done one half way between the occipital protuberance and the tip of the mastoid process behind the ear. The second injection site was done lateral and superior to the first, 1 fingerbreadth from the first injection. The third injection site was 1 fingerbreadth superiorly and medially from the first injection site.  -Cervical paraspinal muscle injection, 2 sites, bilaterally. The first injection site was 1 cm from the midline of the cervical spine, 3 cm inferior to the lower border of the occipital protuberance. The second injection site was 1.5 cm superiorly and laterally to the first injection site.  -Trapezius muscle injection was performed at 3 sites, bilaterally. The first injection site was in the upper trapezius muscle halfway between the inflection point of the neck, and the acromion. The second injection site was one half way between the acromion and the first injection site. The third injection was done between the first injection site and the inflection point of the neck.    A total of 200 units of Botox  was prepared, 155 units of Botox   was injected as documented above, any Botox  not injected was wasted. The patient tolerated the procedure well, there were no complications of the above procedure.   Harlene Bogaert, AGNP-BC  [facility] [address] [place], [place] [address]  Phone [phone] Fax [phone] Note: This document was prepared with digital dictation and possible smart phrase technology. Any transcriptional errors that result from this process are unintentional.

## 2024-08-29 ENCOUNTER — Encounter: Payer: Self-pay | Admitting: Adult Health

## 2024-08-30 MED ORDER — NURTEC 75 MG PO TBDP: 75.0000 mg | ORAL_TABLET | ORAL | 11 refills | Status: AC | PRN

## 2024-09-21 ENCOUNTER — Other Ambulatory Visit (HOSPITAL_BASED_OUTPATIENT_CLINIC_OR_DEPARTMENT_OTHER)

## 2024-09-27 ENCOUNTER — Encounter (HOSPITAL_BASED_OUTPATIENT_CLINIC_OR_DEPARTMENT_OTHER): Payer: Self-pay

## 2024-09-28 ENCOUNTER — Other Ambulatory Visit (HOSPITAL_BASED_OUTPATIENT_CLINIC_OR_DEPARTMENT_OTHER)

## 2024-09-28 DIAGNOSIS — I7121 Aneurysm of the ascending aorta, without rupture: Secondary | ICD-10-CM

## 2024-09-28 LAB — ECHOCARDIOGRAM COMPLETE
Area-P 1/2: 3.77 cm2
S' Lateral: 2.44 cm

## 2024-09-29 ENCOUNTER — Ambulatory Visit (HOSPITAL_BASED_OUTPATIENT_CLINIC_OR_DEPARTMENT_OTHER): Payer: Self-pay | Admitting: Family

## 2024-10-05 ENCOUNTER — Other Ambulatory Visit: Payer: Self-pay | Admitting: Adult Health

## 2024-11-22 ENCOUNTER — Ambulatory Visit: Admitting: Adult Health

## 2024-12-06 ENCOUNTER — Ambulatory Visit (HOSPITAL_BASED_OUTPATIENT_CLINIC_OR_DEPARTMENT_OTHER): Admitting: Cardiovascular Disease

## 2024-12-14 ENCOUNTER — Institutional Professional Consult (permissible substitution): Admitting: Neurology
